# Patient Record
Sex: Male | Born: 1940
Health system: Southern US, Community
[De-identification: ages and names within clinical notes are randomized; demographics above are authoritative.]

## PROBLEM LIST (undated history)

## (undated) DIAGNOSIS — K219 Gastro-esophageal reflux disease without esophagitis: Secondary | ICD-10-CM

## (undated) DIAGNOSIS — K449 Diaphragmatic hernia without obstruction or gangrene: Secondary | ICD-10-CM

## (undated) DIAGNOSIS — J189 Pneumonia, unspecified organism: Secondary | ICD-10-CM

## (undated) DIAGNOSIS — M1990 Unspecified osteoarthritis, unspecified site: Secondary | ICD-10-CM

## (undated) DIAGNOSIS — N486 Induration penis plastica: Secondary | ICD-10-CM

## (undated) DIAGNOSIS — J45909 Unspecified asthma, uncomplicated: Secondary | ICD-10-CM

## (undated) DIAGNOSIS — G8929 Other chronic pain: Secondary | ICD-10-CM

## (undated) DIAGNOSIS — R519 Headache, unspecified: Secondary | ICD-10-CM

## (undated) DIAGNOSIS — K648 Other hemorrhoids: Secondary | ICD-10-CM

## (undated) DIAGNOSIS — R51 Headache: Secondary | ICD-10-CM

## (undated) HISTORY — DX: Unspecified osteoarthritis, unspecified site: M19.90

## (undated) HISTORY — DX: Headache, unspecified: R51.9

## (undated) HISTORY — PX: SKIN CANCER EXCISION: SHX779

## (undated) HISTORY — DX: Other hemorrhoids: K64.8

## (undated) HISTORY — DX: Gastro-esophageal reflux disease without esophagitis: K21.9

## (undated) HISTORY — DX: Unspecified asthma, uncomplicated: J45.909

## (undated) HISTORY — DX: Induration penis plastica: N48.6

## (undated) HISTORY — DX: Diaphragmatic hernia without obstruction or gangrene: K44.9

## (undated) HISTORY — DX: Headache: R51

## (undated) HISTORY — PX: FINGER SURGERY: SHX640

## (undated) HISTORY — PX: SHOULDER SURGERY: SHX246

## (undated) HISTORY — PX: KNEE SURGERY: SHX244

## (undated) HISTORY — DX: Other chronic pain: G89.29

## (undated) HISTORY — PX: COLONOSCOPY: SHX174

---

## 1999-06-07 HISTORY — PX: OTHER SURGICAL HISTORY: SHX169

## 2001-02-22 ENCOUNTER — Emergency Department (HOSPITAL_COMMUNITY): Admission: EM | Admit: 2001-02-22 | Discharge: 2001-02-22 | Payer: Self-pay | Admitting: Emergency Medicine

## 2001-02-22 ENCOUNTER — Encounter: Payer: Self-pay | Admitting: Emergency Medicine

## 2001-04-09 ENCOUNTER — Ambulatory Visit (HOSPITAL_COMMUNITY): Admission: RE | Admit: 2001-04-09 | Discharge: 2001-04-09 | Payer: Self-pay | Admitting: Urology

## 2002-01-29 ENCOUNTER — Encounter: Payer: Self-pay | Admitting: Gastroenterology

## 2003-06-04 ENCOUNTER — Encounter: Payer: Self-pay | Admitting: Gastroenterology

## 2003-07-25 ENCOUNTER — Encounter: Admission: RE | Admit: 2003-07-25 | Discharge: 2003-07-25 | Payer: Self-pay | Admitting: Otolaryngology

## 2005-04-14 ENCOUNTER — Ambulatory Visit: Payer: Self-pay | Admitting: Gastroenterology

## 2005-04-22 ENCOUNTER — Ambulatory Visit: Payer: Self-pay | Admitting: Gastroenterology

## 2005-05-26 ENCOUNTER — Ambulatory Visit (HOSPITAL_COMMUNITY): Admission: RE | Admit: 2005-05-26 | Discharge: 2005-05-26 | Payer: Self-pay | Admitting: Gastroenterology

## 2005-05-26 ENCOUNTER — Encounter: Payer: Self-pay | Admitting: Gastroenterology

## 2005-05-27 ENCOUNTER — Ambulatory Visit: Payer: Self-pay | Admitting: Gastroenterology

## 2005-09-12 ENCOUNTER — Ambulatory Visit: Payer: Self-pay | Admitting: Gastroenterology

## 2006-01-18 ENCOUNTER — Ambulatory Visit: Payer: Self-pay | Admitting: Gastroenterology

## 2006-07-14 ENCOUNTER — Ambulatory Visit: Payer: Self-pay | Admitting: Gastroenterology

## 2007-03-21 ENCOUNTER — Ambulatory Visit: Payer: Self-pay | Admitting: Gastroenterology

## 2007-06-18 ENCOUNTER — Ambulatory Visit: Payer: Self-pay | Admitting: Vascular Surgery

## 2007-08-07 DIAGNOSIS — K219 Gastro-esophageal reflux disease without esophagitis: Secondary | ICD-10-CM | POA: Insufficient documentation

## 2007-08-07 DIAGNOSIS — K449 Diaphragmatic hernia without obstruction or gangrene: Secondary | ICD-10-CM | POA: Insufficient documentation

## 2007-08-07 DIAGNOSIS — N4889 Other specified disorders of penis: Secondary | ICD-10-CM | POA: Insufficient documentation

## 2007-08-07 DIAGNOSIS — R51 Headache: Secondary | ICD-10-CM | POA: Insufficient documentation

## 2007-08-07 DIAGNOSIS — R519 Headache, unspecified: Secondary | ICD-10-CM | POA: Insufficient documentation

## 2007-08-07 DIAGNOSIS — M129 Arthropathy, unspecified: Secondary | ICD-10-CM | POA: Insufficient documentation

## 2007-11-09 ENCOUNTER — Encounter: Payer: Self-pay | Admitting: Pulmonary Disease

## 2007-11-28 ENCOUNTER — Ambulatory Visit: Payer: Self-pay | Admitting: Pulmonary Disease

## 2007-11-28 DIAGNOSIS — J45909 Unspecified asthma, uncomplicated: Secondary | ICD-10-CM | POA: Insufficient documentation

## 2007-11-28 DIAGNOSIS — R0602 Shortness of breath: Secondary | ICD-10-CM | POA: Insufficient documentation

## 2007-11-28 DIAGNOSIS — R498 Other voice and resonance disorders: Secondary | ICD-10-CM | POA: Insufficient documentation

## 2007-12-13 ENCOUNTER — Telehealth (INDEPENDENT_AMBULATORY_CARE_PROVIDER_SITE_OTHER): Payer: Self-pay | Admitting: *Deleted

## 2007-12-18 ENCOUNTER — Telehealth (INDEPENDENT_AMBULATORY_CARE_PROVIDER_SITE_OTHER): Payer: Self-pay | Admitting: *Deleted

## 2007-12-26 ENCOUNTER — Ambulatory Visit: Payer: Self-pay | Admitting: Pulmonary Disease

## 2008-01-18 ENCOUNTER — Telehealth (INDEPENDENT_AMBULATORY_CARE_PROVIDER_SITE_OTHER): Payer: Self-pay | Admitting: *Deleted

## 2008-01-31 ENCOUNTER — Telehealth (INDEPENDENT_AMBULATORY_CARE_PROVIDER_SITE_OTHER): Payer: Self-pay | Admitting: *Deleted

## 2008-02-08 ENCOUNTER — Telehealth: Payer: Self-pay | Admitting: Pulmonary Disease

## 2008-04-03 ENCOUNTER — Telehealth (INDEPENDENT_AMBULATORY_CARE_PROVIDER_SITE_OTHER): Payer: Self-pay | Admitting: *Deleted

## 2008-04-07 ENCOUNTER — Ambulatory Visit: Payer: Self-pay | Admitting: Pulmonary Disease

## 2008-04-07 DIAGNOSIS — J209 Acute bronchitis, unspecified: Secondary | ICD-10-CM | POA: Insufficient documentation

## 2008-04-17 DIAGNOSIS — K648 Other hemorrhoids: Secondary | ICD-10-CM | POA: Insufficient documentation

## 2008-04-18 ENCOUNTER — Ambulatory Visit: Payer: Self-pay | Admitting: Gastroenterology

## 2008-06-11 ENCOUNTER — Telehealth (INDEPENDENT_AMBULATORY_CARE_PROVIDER_SITE_OTHER): Payer: Self-pay | Admitting: *Deleted

## 2008-07-03 ENCOUNTER — Telehealth (INDEPENDENT_AMBULATORY_CARE_PROVIDER_SITE_OTHER): Payer: Self-pay | Admitting: *Deleted

## 2008-09-02 ENCOUNTER — Telehealth: Payer: Self-pay | Admitting: Gastroenterology

## 2008-09-02 ENCOUNTER — Telehealth (INDEPENDENT_AMBULATORY_CARE_PROVIDER_SITE_OTHER): Payer: Self-pay | Admitting: *Deleted

## 2008-10-06 ENCOUNTER — Ambulatory Visit: Payer: Self-pay | Admitting: Pulmonary Disease

## 2008-10-14 ENCOUNTER — Telehealth: Payer: Self-pay | Admitting: Gastroenterology

## 2008-10-27 ENCOUNTER — Telehealth (INDEPENDENT_AMBULATORY_CARE_PROVIDER_SITE_OTHER): Payer: Self-pay | Admitting: *Deleted

## 2008-11-04 ENCOUNTER — Telehealth: Payer: Self-pay | Admitting: Pulmonary Disease

## 2008-11-24 ENCOUNTER — Telehealth: Payer: Self-pay | Admitting: Pulmonary Disease

## 2008-11-25 ENCOUNTER — Ambulatory Visit: Payer: Self-pay | Admitting: Pulmonary Disease

## 2008-12-01 ENCOUNTER — Telehealth: Payer: Self-pay | Admitting: Pulmonary Disease

## 2008-12-02 ENCOUNTER — Telehealth: Payer: Self-pay | Admitting: Gastroenterology

## 2008-12-23 ENCOUNTER — Telehealth (INDEPENDENT_AMBULATORY_CARE_PROVIDER_SITE_OTHER): Payer: Self-pay | Admitting: *Deleted

## 2009-01-20 ENCOUNTER — Telehealth: Payer: Self-pay | Admitting: Pulmonary Disease

## 2009-02-16 ENCOUNTER — Telehealth (INDEPENDENT_AMBULATORY_CARE_PROVIDER_SITE_OTHER): Payer: Self-pay | Admitting: *Deleted

## 2009-02-18 ENCOUNTER — Ambulatory Visit: Payer: Self-pay | Admitting: Pulmonary Disease

## 2009-03-02 ENCOUNTER — Telehealth (INDEPENDENT_AMBULATORY_CARE_PROVIDER_SITE_OTHER): Payer: Self-pay | Admitting: *Deleted

## 2009-05-04 ENCOUNTER — Ambulatory Visit: Payer: Self-pay | Admitting: Gastroenterology

## 2009-05-04 ENCOUNTER — Ambulatory Visit: Payer: Self-pay | Admitting: Pulmonary Disease

## 2009-05-04 DIAGNOSIS — J45901 Unspecified asthma with (acute) exacerbation: Secondary | ICD-10-CM | POA: Insufficient documentation

## 2009-06-01 ENCOUNTER — Telehealth: Payer: Self-pay | Admitting: Gastroenterology

## 2009-06-01 ENCOUNTER — Telehealth (INDEPENDENT_AMBULATORY_CARE_PROVIDER_SITE_OTHER): Payer: Self-pay | Admitting: *Deleted

## 2009-06-08 ENCOUNTER — Ambulatory Visit: Payer: Self-pay | Admitting: Pulmonary Disease

## 2009-06-09 ENCOUNTER — Telehealth: Payer: Self-pay | Admitting: Pulmonary Disease

## 2009-06-11 ENCOUNTER — Telehealth: Payer: Self-pay | Admitting: Pulmonary Disease

## 2009-09-30 ENCOUNTER — Telehealth (INDEPENDENT_AMBULATORY_CARE_PROVIDER_SITE_OTHER): Payer: Self-pay | Admitting: *Deleted

## 2009-12-10 ENCOUNTER — Telehealth: Payer: Self-pay | Admitting: Gastroenterology

## 2009-12-10 ENCOUNTER — Ambulatory Visit: Payer: Self-pay | Admitting: Pulmonary Disease

## 2009-12-31 ENCOUNTER — Telehealth: Payer: Self-pay | Admitting: Gastroenterology

## 2010-02-09 ENCOUNTER — Encounter (INDEPENDENT_AMBULATORY_CARE_PROVIDER_SITE_OTHER): Payer: Self-pay | Admitting: *Deleted

## 2010-03-11 ENCOUNTER — Telehealth: Payer: Self-pay | Admitting: Gastroenterology

## 2010-03-30 ENCOUNTER — Telehealth: Payer: Self-pay | Admitting: Gastroenterology

## 2010-05-10 ENCOUNTER — Ambulatory Visit: Payer: Self-pay | Admitting: Gastroenterology

## 2010-05-10 DIAGNOSIS — K649 Unspecified hemorrhoids: Secondary | ICD-10-CM | POA: Insufficient documentation

## 2010-05-10 DIAGNOSIS — R1310 Dysphagia, unspecified: Secondary | ICD-10-CM | POA: Insufficient documentation

## 2010-05-19 ENCOUNTER — Encounter: Payer: Self-pay | Admitting: Gastroenterology

## 2010-05-19 ENCOUNTER — Ambulatory Visit (HOSPITAL_COMMUNITY)
Admission: RE | Admit: 2010-05-19 | Discharge: 2010-05-19 | Payer: Self-pay | Source: Home / Self Care | Attending: Gastroenterology | Admitting: Gastroenterology

## 2010-05-19 ENCOUNTER — Ambulatory Visit: Payer: Self-pay | Admitting: Internal Medicine

## 2010-05-19 ENCOUNTER — Telehealth: Payer: Self-pay | Admitting: Gastroenterology

## 2010-05-19 DIAGNOSIS — R932 Abnormal findings on diagnostic imaging of liver and biliary tract: Secondary | ICD-10-CM | POA: Insufficient documentation

## 2010-06-03 ENCOUNTER — Telehealth: Payer: Self-pay | Admitting: Gastroenterology

## 2010-07-08 NOTE — Letter (Signed)
Summary: [facility]  [facility]   Imported By: Sherian Rein 05/11/2010 11:58:14  _____________________________________________________________________  External Attachment:    Type:   Image     Comment:   External Document

## 2010-07-08 NOTE — Procedures (Signed)
Summary: Modified Barium Swallow   Modified Barium Swallow  Procedure date:  05/19/2010  Findings:      abnormal:   MODIFIED BARIUM SWALLOW STUDY SWALLOW HISTORY  14Dec11 11:00am BD  SWALLOW HX    MBSS Outpatient                          Yes        14Dec11 11:47am BD    Therapy Diagnosis Impairment             Dysphagia  14Dec11 11:47am BD    Dx/Referral Reason/Past Medical Hx       Yes        14Dec11 11:47am BD           Comment: Mr. Gehl arrives for an outpatient MBS due to           physician conerns that the pt may be aspirating secondary to           complaints of intermittent coughing with liquids, GERD and           hoarseness. Mr. Sleight appears very healthy and does not           name any significant medical history other than an episode of           pneumonia a few years ago.    Pt FollowsDirections/CompleteStrategies  Yes        14Dec11 11:47am BD    Dentition condition                      Adequate   14Dec11 11:47am BD    Postural control adequate for testing    Yes        14Dec11 11:47am BD    Current Diet                             Regular    14Dec11 11:47am BD    Liquids                                  Thin       14Dec11 11:47am BD    Oral Motor Evaluation                    WFL        14Dec11 11:47am BD    Penetration/Aspiration Scale Guide       Yes        14Dec11 11:47am BD           Comment: 1 = material does not enter airway           2 = material enters airway, remains ABOVE vocal cords           then ejected out           3 = material enters airway, remains ABOVE cords and not           ejected out           4 = material enters airway, CONTACTS cords then ejected           out           5 = material enters airway, CONTACTS cords and not ejected           out  6 = material enters airway, passes BELOW cords then ejected           out           7 = material enters airway, passes BELOW cords and not           ejected out despite cough attempt  by patient           8 = material enters airway, passes BELOW cords without           attempt by patient to eject out(silent aspiration)  SWALLOW PHASE THIN/TSP  14Dec11 11:00am BD  ORAL THIN TEASPOON    Not tested-TT                            Yes        14Dec11 11:47am BD  ORAL PHARYNGEAL THIN TEASPOON  PHARYNGEAL THIN TEASPOON  SWALLOW PHASE THIN/CUP  14Dec11 11:00am BD  ORAL THIN CUP  ORAL PHARYNGEAL THIN CUP    Premature bolus loss over tongue base-TC Yes        14Dec11 11:48am BD    Swallow initiation location-TC           PyriformSi 14Dec11 11:48am BD  PHARYNGEAL THIN CUP    Decreased tongue base retraction-TC      Yes        14Dec11 11:48am BD    Decreased pharyngeal contraction-TC      Yes        14Dec11 11:48am BD    Vallecular Residue-TC                    Mild       14Dec11 11:48am BD    Pyriform Sinus Residue-TC                Mild       14Dec11 11:48am BD    Silent Aspiration-TC                     Yes        14Dec11 11:48am BD    Aspiration before swallow-TC             Yes        14Dec11 11:48am BD    Compensatory strategies-TC               Yes        14Dec11 11:50am BD           Comment: Aspiration did not occur with a chin tuck or after           pt was instructed to take small single sips.    Penetration/Aspiration Scale-TC          1,8        14Dec11 11:48am BD  SWALLOW PHASE THIN/STRAW  14Dec11 11:00am BD  ORAL THIN STRAW    Not tested-TS                            Yes        14Dec11 11:48am BD  ORAL PHARYNGEAL THIN STRAW  PHARYNGEAL THIN STRAW  SWALLOW PHASE NECTAR/TSP  14Dec11 11:00am BD  ORAL NECTAR TEASPOON    Not tested-NT                            Yes  14Dec11 11:48am BD  ORAL PHARYNGEAL NECTAR TEASPOON  PHARYNGEAL NECTAR TEASPOON  SWALLOW PHASE NECTAR/CUP  14Dec11 11:00am BD  ORAL NECTAR CUP    Not tested-[place]                            Yes        14Dec11 11:48am BD  ORAL PHARNYGEAL NECTAR CUP  PHARYNGEAL NECTAR CUP  SWALLOW PHASE  NECTAR/STRAW  14Dec11 11:00am BD  ORAL NECTAR STRAW    Not tested-NS                            Yes        14Dec11 11:48am BD  ORAL PHARYNGEAL NECTAR STRAW  PHARYNGEAL NECTAR STRAW  SWALLOW PHASE HONEY/TSP  14Dec11 11:00am BD  ORAL HONEY TEASPOON    Not tested-HT                            Yes        14Dec11 11:48am BD  ORAL PHARYNGEAL HONEY TEASPOON  PHARYNGEAL HONEY TEASPOON  SWALLOW PHASE HONEY/CUP  14Dec11 11:00am BD  ORAL HONEY CUP    Not tested-HC                            Yes        14Dec11 11:48am BD  ORAL PHARYNGEAL HONEY CUP  PHARYNGEAL HONEY CUP  SWALLOW PHASE PUREE  14Dec11 11:00am BD  ORAL PUREE  ORAL PHARYNGEAL PUREE    Swallow initiation location-P            Valleculae 14Dec11 11:49am BD  PHARYNGEAL PUREE    Penetration/Aspiration Scale-P           1          14Dec11 11:49am BD  SWALLOW PHASE MECHANICAL SOFT  14Dec11 11:00am BD  ORAL MECHANICAL SOFT  ORAL PHARYNGEAL MECHANICAL SOFT    Swallow initiation location-MS           Valleculae 14Dec11 11:49am BD  PHARYNGEAL MECHANICAL SOFT    Penetration/Aspiration Scale-MS          1          14Dec11 11:49am BD  SWALLOW PHASE MIXED  14Dec11 11:00am BD  ORAL MIXED CONSISTENCY  ORAL PHARYNGEAL MIXED CONSISTENCY  PHARYNGEAL MIXED CONSISTENCY  SWALLOW PHASE PILL  14Dec11 11:00am BD  ORAL PILL  ORAL PHARYNGEAL PILL    Premature bolus loss over tongue base-PL Yes        14Dec11 11:51am BD    Swallow initiation location-PL           PyriformSi 14Dec11 11:51am BD  PHARYNGEAL PILL    Penetration/Aspiration Scale-PL          1          14Dec11 11:51am BD  SWALLOW EVALUATION  14Dec11 11:00am BD  SWALLOW EVAL    Normal UES Relaxation                    Yes        14Dec11 12:09pm BD    Esophageal Bolus Transit                 Yes        14Dec11 12:09pm BD  Comment: Esophageal scan with solids, liquids and barium           tablet did not appear to reveal stasis or reflux.  No           radiologist  present to confirm.  MBSS RECOMMENDATIONS    Clinical Impressions/Functional Problems Yes        14Dec11 12:09pm BD           Comment: Mr. Zylstra presents with a moderate oropharyngeal           dysphagia with one instance of significant silent aspiration           of thin liquids.  Aspiration occurred due to impaired oral           containment of liquid bolus with premature spillage to           pyriform sinuses as well as an overall delay in swallow           initiation to the pyriforms due to decreased sensation.  When           the pt was instructed to take small single sips he showed           improved oral containment of POs as well as more timely           protection of the airway.  A chin tuck also benefitted pt.           Mild residue remained in the valleculae and pyriform sinuses           with thin liquids.  Strength of musculature did not appear           significantly impaired though presence of residuals suggests           decreased base of tongue retraction and pharyngeal           contraction.  At this time, the pt may continue a           regular/thin diet with strict adherence to small single sip           strategy.  The pt was also urged to seek outpatient ST for           exercises and guidance with strategies.  Etiology of pt's           dysphagia unknown but severity of silent aspiration in an           apparently healthy man warrants further examination, (onset           of neuromuscular disorder?) consider neurology consult.    Diet recommendations                     Regular    14Dec11 12:09pm BD    Liquid recommendations                   Thin       14Dec11 12:09pm BD    Swallowing therapy recommended           Yes        14Dec11 12:09pm BD    Able to participate in swallow therapy   Yes        14Dec11 12:09pm BD  MBSS ASPIRATION PRECAUTIONS    Supervision needed                       None       14Dec11 12:09pm BD  Eat/drink only when alert                 Yes        14Dec11 12:09pm BD    Seated/upright, 90 degrees               Yes        14Dec11 12:09pm BD    No Straws                                Yes        14Dec11 12:09pm BD    Small bites/sips                         Yes        14Dec11 12:09pm BD  TREATMENT PLAN AND GOALS  14Dec11 11:00am BD  MBSS TREATMENT PLAN/GOALS    Defer treatment plan to SLP at           Outpatient 14Dec11 12:09pm BD    Inpatient Treatment Plan N/A             Yes        14Dec11 12:09pm BD  MBSS MUTUAL GOALS    Mutual Goals N/A                         Yes        14Dec11 12:09pm BD  MBSS INTERVENTIONS  MBSS EDUCATION AND DISCHARGE  14Dec11 11:00am BD  MBSS PATIENT/FAMILY EDUCATION    Recommend patient receive f/u therapy at Outpatient 14Dec11 12:10pm BD    Compensatory strategies provided         DemoVerbal 14Dec11 12:10pm BD    Diet modification provided               Verbal     14Dec11 12:10pm BD    Aspiration precautions provided          Verbal     14Dec11 12:10pm BD    Reflux precautions provided              Verbal     14Dec11 12:10pm BD    Verbalizes understanding of education    Pt/family  14Dec11 12:10pm BD    MBSS Goals/discharge criteria discussed* Yes        14Dec11 12:10pm BD  MBSS TIME    MBSS Therapy Time In                     1100       14Dec11 12:10pm BD    MBSS Therapy Time Out                    1130       14Dec11 12:10pm BD    MBSS Therapy Charges/minutes             MBS        14Dec11 12:10pm BD  MBSS REPORTS    MBSS Copy to Referring Physician         Yes        14Dec11 12:10pm BD  MBSS REVIEW OF STUDENT COMPLETION    Appended Document: Modified Barium Swallow See MBSS recommendations ST outpatient consult Neurology consult with these record and recent office records  Appended Document: Modified Barium Swallow Spoke with patient and explained to the patient of the MBSS recommendations.  Faxed over referral information for [facility] to get a appt date and time. Waiting on information  of date and time of first available appoitnment.  Recommended to patient ST consult also.

## 2010-07-08 NOTE — Progress Notes (Signed)
Summary: re-fax ov notes/ surgery clearance  Phone Note Call from Patient   Caller: karen w/ dr collin's office Call For: Erik Gomez Summary of Call: fax was not received (re: surgery clearance) although kc did sign this yesterday evening. please re- fax asap to attn: karen [phone] (i have verified fax # w/ karen Initial call taken by: Tivis Ringer,  June 11, 2009 9:54 AM  Follow-up for Phone Call        note refaxed.Carron Curie CMA  June 11, 2009 9:57 AM

## 2010-07-08 NOTE — Progress Notes (Signed)
Summary: Samples  Phone Note Call from Patient Call back at Home Phone [phone]   Caller: Patient Call For: Dr. Russella Dar Reason for Call: Talk to Nurse Summary of Call: would like some samples of Nexium Initial call taken by: Karna Christmas,  December 31, 2009 9:26 AM  Follow-up for Phone Call        Samples left up front for pt to pick up and pt notified. Lot # [id number] Follow-up by: Christie Nottingham CMA Duncan Dull),  December 31, 2009 9:31 AM

## 2010-07-08 NOTE — Progress Notes (Signed)
Summary: Samples of Nexium  Phone Note Call from Patient Call back at Home Phone [phone]   Caller: Pat-spouse Call For: Dr Russella Dar Summary of Call: Wonders if we have any Nexium samples they can have? Initial call taken by: Leanor Kail [facility],  March 11, 2010 8:07 AM  Follow-up for Phone Call        Told pt we cannot continue to give them samples of Nexium and that the pt does need a follow-up in a couple of months. Samples left up front for pt to pick up. Pt agreed. Follow-up by: Christie Nottingham CMA Duncan Dull),  March 11, 2010 8:26 AM

## 2010-07-08 NOTE — Assessment & Plan Note (Signed)
Summary: annual...as.   History of Present Illness Visit Type: Follow-up Visit Primary GI MD: Elie Goody MD [facility] Primary Provider: Geoffry Paradise, MD Requesting Provider: na  Chief Complaint: F/u for GERD. Pt states that when he swallows liquids he coughs History of Present Illness:   Erik Gomez is here today with his wife and notes intermittent problems with hoarseness. He notes episodes of  "strangling" describes as coughing with drinking liquids or swallowing saliva. He has no difficulty swallowing foods. He has no heartburn, regurgitation, chest pain or weight loss.   GI Review of Systems    Reports dysphagia with liquids.      Denies abdominal pain, acid reflux, belching, bloating, chest pain, dysphagia with solids, heartburn, loss of appetite, nausea, vomiting, vomiting blood, weight loss, and  weight gain.        Denies anal fissure, black tarry stools, change in bowel habit, constipation, diarrhea, diverticulosis, fecal incontinence, heme positive stool, hemorrhoids, irritable bowel syndrome, jaundice, light color stool, liver problems, rectal bleeding, and  rectal pain.  Current Medications (verified):  Current Medications (verified): 1)  Nexium 40 Mg  Cpdr (Esomeprazole Magnesium) .... Take 1 Tablet By Mouth Two Times A Day 2)  Proair Hfa 108 (90 Base) Mcg/act  Aers (Albuterol Sulfate) .Marland Kitchen.. 1-2 Puffs Every 4-6 Hours As Needed 3)  Singulair 10 Mg Tabs (Montelukast Sodium) .... Take 1 Tablet By Mouth Once A Day 4)  Amoxicillin 500 Mg Tabs (Amoxicillin) .... One Tablet By Mouth Three Times A Day  After Dental Surgery Will Finish Tmr.  Allergies (verified): 1)  ! Codeine 2)  ! * Mobic 3)  ! * Protonix  Past History:  Past Medical History: INTRINSIC ASTHMA, WITH EXACERBATION (ICD-493.12) INTERNAL HEMORRHOIDS (ICD-455.0) ASTHMATIC BRONCHITIS, ACUTE (ICD-466.0) INTRINSIC ASTHMA, UNSPECIFIED (ICD-493.10) ARTHRITIS (ICD-716.90) HEADACHE, CHRONIC  (ICD-784.0) PEYRONIE'S DISEASE (ICD-607.89) HIATAL HERNIA (ICD-553.3) GERD (ICD-530.81)  Past Surgical History: Peyronie's Disease Surgery-2001 Miiddle finger right hand surgery Left Knee Surgery   Family History: Reviewed history from 05/04/2009 and no changes required. heart disease: father, mother No FH of Colon Cancer Family History of Diabetes: sister, brother, father  Social History: Reviewed history from 05/04/2009 and no changes required. Patient states former smoker.  pt is married. pt has children. pt works as a Nurse, adult Alcohol Use - yes glass wine seldom Daily Caffeine Use  Review of Systems       The patient complains of allergy/sinus and arthritis/joint pain.         The pertinent positives and negatives are noted as above and in the HPI. All other ROS were reviewed and were negative.  Vital Signs:  Patient profile:   70 year old male Height:      70 inches Weight:      196 pounds BMI:     28.22 BSA:     2.07 Pulse rate:   60 / minute Pulse rhythm:   regular BP sitting:   120 / 64  (left arm) Cuff size:   regular  Vitals Entered By: Ok Anis CMA (May 10, 2010 2:56 PM)  Physical Exam  General:  Well developed, well nourished, no acute distress. Head:  Normocephalic and atraumatic. Eyes:  PERRLA, no icterus. Ears:  Normal auditory acuity. Mouth:  No deformity or lesions, dentition normal. Neck:  Supple; no masses or thyromegaly. Lungs:  Clear throughout to auscultation. Heart:  Regular rate and rhythm; no murmurs, rubs,  or bruits. Abdomen:  Soft, nontender and nondistended. No masses, hepatosplenomegaly or  hernias noted. Normal bowel sounds. Msk:  Symmetrical with no gross deformities. Normal posture. Pulses:  Normal pulses noted. Extremities:  No clubbing, cyanosis, edema or deformities noted. Neurologic:  Alert and  oriented x4;  grossly normal neurologically. Psych:  Alert and cooperative. Normal mood and  affect.  Impression & Recommendations:  Problem # 1:  GERD (ICD-530.81) GERD with LPR. His intermittent hoarseness could be related to LPR, but I am concerned there may be other ENT problems. I recommended returning to Dr.Wolicki or another ENT is for evaluation and he states he would like to consider this option. Continue Nexium 40 mg twice daily, and standard antireflux measures. Given long-term PPI usage will screening for osteopenia/osteoporosis. Orders:. T-Bone Densitometry [phone])  Problem # 2:  DYSPHAGIA UNSPECIFIED (ICD-787.20) Coughing with swallowing saliva and liquids. Rule out oropharyngeal disorders. Orders: Barium Swallow, Modified  (Modified BS)  Problem # 3:  HOARSENESS (ICD-784.49) See problem #1.  Patient Instructions: 1)  We have scheduled your Bone Density study on 05/19/10 at 9:30am.  2)  We will call you tomorrow to schedule your Modified Barium Swallow.  3)  Copy sent to : Geoffry Paradise, MD 4)  The medication list was reviewed and reconciled.  All changed / newly prescribed medications were explained.  A complete medication list was provided to the patient / caregiver.

## 2010-07-08 NOTE — Progress Notes (Signed)
Summary: result  Phone Note Call from Patient Call back at Home Phone [phone]   Caller: Spouse Pat Call For: Dr Russella Dar Reason for Call: Talk to Nurse, Lab or Test Results Summary of Call: Patient would like Dexa scan result Initial call taken by: Tawni Levy,  June 03, 2010 9:20 AM  Follow-up for Phone Call        Message left for patient to call back.Jesse Fall RN  June 03, 2010 1:09 PM Spoke with patient's wife and let her know it takes 3-4 weeks to get these results. She will call back when 4 weeks have passed. Follow-up by: Jesse Fall RN,  June 03, 2010 3:12 PM

## 2010-07-08 NOTE — Assessment & Plan Note (Signed)
Summary: rov for asthma   Copy to:  Geoffry Paradise Primary Provider/Referring Provider:  Geoffry Paradise, MD  CC:  6 mth rov - f/u asthma - denies sob or wheezing - occas dry cough.  History of Present Illness: The pt comes in today for f/u of his known asthma.  It was difficult finding a maintenance regimen for him due to intolerance of EVERY ICS.  We ended up treating him with singulair, and he seems to be doing well on this.  He has not had a flareup or required his rescue inhaler.  He has mnimal cough from postnasal drip, and is staying on his reflux meds.  Current Medications (verified): 1)  Nexium 40 Mg  Cpdr (Esomeprazole Magnesium) .... Take 1 Tablet By Mouth Once Daily 2)  Proair Hfa 108 (90 Base) Mcg/act  Aers (Albuterol Sulfate) .Marland Kitchen.. 1-2 Puffs Every 4-6 Hours As Needed 3)  Singulair 10 Mg Tabs (Montelukast Sodium) .... Take 1 Tablet By Mouth Once A Day  Allergies (verified): 1)  ! Codeine 2)  ! * Mobic 3)  ! * Protonix  Review of Systems       The patient complains of non-productive cough.  The patient denies shortness of breath with activity, shortness of breath at rest, productive cough, coughing up blood, chest pain, irregular heartbeats, acid heartburn, indigestion, loss of appetite, weight change, abdominal pain, difficulty swallowing, sore throat, tooth/dental problems, headaches, nasal congestion/difficulty breathing through nose, sneezing, itching, ear ache, anxiety, depression, hand/feet swelling, joint stiffness or pain, rash, change in color of mucus, and fever.    Vital Signs:  Patient profile:   70 year old male Height:      70 inches Weight:      193 pounds BMI:     27.79 O2 Sat:      95 % on Room air Temp:     97.3 degrees F oral Pulse rate:   61 / minute BP sitting:   119 / 60  (left arm) Cuff size:   regular  O2 Flow:  Room air  Physical Exam  General:  wd male in nad Nose:  no drainage or purulence noted. Lungs:  totally clear to  auscultation Heart:  rrr, no mrg Extremities:  no edema or cyanosis Neurologic:  alert and oriented, moves all 4.   Impression & Recommendations:  Problem # 1:  INTRINSIC ASTHMA, UNSPECIFIED (ICD-493.10) the pt is doing well on his current meds, and has not had an acute exacerbation or need for rescue inhaler use.  He feels that he is at a stable baseline.  Will continue on current meds, and I have encouraged him to work on a consistent exercise program.  Medications Added to Medication List This Visit: 1)  Nexium 40 Mg Cpdr (Esomeprazole magnesium) .... Take 1 tablet by mouth once daily  Other Orders: Est. Patient Level III (11914)  Patient Instructions: 1)  no change in current meds. 2)  try and work on regular exercise 3)  followup with me in 12mos.

## 2010-07-08 NOTE — Progress Notes (Signed)
Summary: fax/ surgery clearance  Phone Note From Other Clinic   Caller: karen w/ dr Thomasena Edis- ortho doctor Call For: clance Summary of Call: pt was seen by kc yesterday for surgery clearance (knee). please fax clearance notes asap so they may schedule this for pt. fax to attn: karen [phone] Initial call taken by: Tivis Ringer,  June 09, 2009 4:39 PM  Follow-up for Phone Call        ov note still on hold.  will forward message to kc.  Aundra Millet Reynolds LPN  June 09, 2009 4:41 PM   megan, dr. Thomasena Edis office called again and stated they need the OV note ASAP. Still waiting KC to sign. Carron Curie CMA  June 10, 2009 2:00 PM   Additional Follow-up for Phone Call Additional follow up Details #1::        done and faxed. Additional Follow-up by: Barbaraann Share MD,  June 10, 2009 5:29 PM     Appended Document: fax/ surgery clearance please call Dr. Thomasena Edis' office in am and make sure they got my recent ov for patient.  thanks  Appended Document: fax/ surgery clearance LMOM with Clydie Braun at Dr. France Ravens office to call me if she didn't receive fax.

## 2010-07-08 NOTE — Progress Notes (Signed)
Summary: Samples  Phone Note Call from Patient Call back at Pt walked in   Caller: Walked in Call For: Marchelle Folks Summary of Call: Pt wants Nexium samples. Samples left up front for pt to pick up. Exp [id number] Exp 05/2012 4 boxes Initial call taken by: Christie Nottingham CMA (AAMA),  December 10, 2009 8:55 AM

## 2010-07-08 NOTE — Progress Notes (Signed)
Summary: Prior Auth.  Phone Note Call from Patient   Caller: Patient Call For: Dr. Russella Dar Reason for Call: Talk to Nurse Summary of Call: Ins. needs prior auth. for his Nexium....[facility] [phone] Initial call taken by: Karna Christmas,  March 30, 2010 12:09 PM  Follow-up for Phone Call        Pt's number is busy. Pts insurance company approved medicine until 10/2010. Follow-up by: Christie Nottingham CMA Duncan Dull),  March 30, 2010 1:58 PM  Additional Follow-up for Phone Call Additional follow up Details #1::        Pt notified of insurance approval. Additional Follow-up by: Christie Nottingham CMA Duncan Dull),  March 30, 2010 2:53 PM

## 2010-07-08 NOTE — Letter (Signed)
Summary: Office Visit Letter  [facility]  [address] [place], [place] [address]   Phone: [phone]  Fax: [phone]      February 09, 2010 MRN: [id number]   Erik Gomez [address] [place], [place]  [address]   Dear Erik Gomez,   According to our records, it is time for you to schedule a follow-up office visit with [place] in November.   At your convenience, please call [phone] (option #2)to schedule an office visit. If you have any questions, concerns, or feel that this letter is in error, we would appreciate your call.   Sincerely,  Judie Petit T. Russella Dar, M.D.  [facility] [phone]

## 2010-07-08 NOTE — Assessment & Plan Note (Signed)
Summary: rov for asthma   Copy to:  Erik Gomez Primary Provider/Referring Provider:  Geoffry Paradise, MD  CC:  Pt is here for a 4 week f/u appt.  Pt states breathing has improved.  Pt denied a cough but states he will "clear his throat" first thing in the mornings.  Pt needs surgical clearance per Dr. Kyra Gomez- L Knee.  Marland Kitchen  History of Present Illness: The pt comes in today for f/u of his known asthma.  He has been intolerant of all ICS, and was started on singulair at the last visit.  He feels he has done well on this, with no upper airway issues or flare of his asthma.  He is happy with his current level of asthma control.  Denies any significant cough or chest congestion.  Current Medications (verified): 1)  Nexium 40 Mg  Cpdr (Esomeprazole Magnesium) .... Take 1 Tablet By Mouth Two Times A Day 2)  Proair Hfa 108 (90 Base) Mcg/act  Aers (Albuterol Sulfate) .Marland Kitchen.. 1-2 Puffs Every 4-6 Hours As Needed 3)  Singulair 10 Mg Tabs (Montelukast Sodium) .... Take 1 Tablet By Mouth Once A Day  Allergies (verified): 1)  ! Codeine 2)  ! * Mobic 3)  ! * Protonix  Review of Systems      See HPI  Vital Signs:  Patient profile:   70 year old male Height:      70 inches Weight:      198.38 pounds BMI:     28.57 O2 Sat:      98 % on Room air Temp:     97.4 degrees F oral Pulse rate:   67 / minute BP sitting:   116 / 72  (left arm) Cuff size:   regular  Vitals Entered By: Erik Filter LPN (June 08, 2009 9:28 AM)  O2 Flow:  Room air CC: Pt is here for a 4 week f/u appt.  Pt states breathing has improved.  Pt denied a cough but states he will "clear his throat" first thing in the mornings.  Pt needs surgical clearance per Dr. Kyra Gomez- L Knee.   Comments Medications reviewed with patient Erik Filter LPN  June 08, 2009 9:28 AM    Physical Exam  General:  wd male in nad Nose:  no drainage noted. Lungs:  totally clear to auscultation, no wheezing Heart:  rrr Extremities:  no edema  or cyanosis Neurologic:  alert and oriented, moves all 4.   Impression & Recommendations:  Problem # 1:  INTRINSIC ASTHMA, UNSPECIFIED (ICD-493.10) the pt is doing well on his singulair.  He has been intolerant to every ICS available.  I have asked him to continue on treatment, and to followup in 6mos.  There is no reason he cannot have upcoming orthopedic surgery from a pulmonary standpoint.  Problem # 2:  HOARSENESS (ZOX-096.04)  He has minimal hoarseness at this time since being off ICS.  He primarily notices first thing in am's, and this is usually associated with postnasal drip and reflux.  He is to continue on his PPI.  If hoarseness continues to be an issue, will need ENT eval of upper airway.  Medications Added to Medication List This Visit: 1)  Singulair 10 Mg Tabs (Montelukast sodium) .... Take 1 tablet by mouth once a day  Other Orders: Est. Patient Level III (54098)  Patient Instructions: 1)  stay on singulair with albuterol for rescue. 2)  stay on your nexium 3)  followup with me in  6mos.   Prescriptions: SINGULAIR 10 MG TABS (MONTELUKAST SODIUM) Take 1 tablet by mouth once a day  #30 x 6   Entered and Authorized by:   Erik Share MD   Signed by:   Erik Share MD on 06/08/2009   Method used:   Print then Give to Patient   RxID:   [id number] PROAIR HFA 108 (90 BASE) MCG/ACT  AERS (ALBUTEROL SULFATE) 1-2 puffs every 4-6 hours as needed  #1 x 6   Entered and Authorized by:   Erik Share MD   Signed by:   Erik Share MD on 06/08/2009   Method used:   Print then Give to Patient   RxID:   [id number]

## 2010-07-08 NOTE — Progress Notes (Signed)
Summary: Nexium samples- wants to stop by today  Phone Note Call from Patient Call back at Home Phone [phone]   Caller: Spouse Call For: Dr Russella Dar Summary of Call: Wonders if we have any Nexium samples they can have? Will be here for a bone density and would like to stop by & pick some up today. Initial call taken by: Leanor Kail [facility],  May 19, 2010 8:34 AM  Follow-up for Phone Call        2 Boxes of Nexium samples given to patient when they arrived in the office today.  Follow-up by: Christie Nottingham CMA Duncan Dull),  May 19, 2010 10:03 AM

## 2010-07-08 NOTE — Progress Notes (Signed)
Summary: OTC med  Phone Note Call from Patient Call back at [phone]   Caller: Patient Call For: clance Reason for Call: Talk to Nurse Summary of Call: Pt takes Singulair, but allergies are really bad.  Wants to know if he can take an OTC allergy med like Claritin alos. Initial call taken by: Eugene Gavia,  September 30, 2009 12:01 PM  Follow-up for Phone Call        Spoke with pt.  Pt states he is taking singular qpm for athma but states his "throat is burning from allergies."  WOuld like to know if he can take something OTC like claritin for this.  Will forward to [facility] - is this ok?  Thanks! Gweneth Dimitri RN  September 30, 2009 12:19 PM   Additional Follow-up for Phone Call Additional follow up Details #1::        zyrtec would work better.  Would also try neilmed sinus rinses am and pm to rinse pollen out of nose each day Additional Follow-up by: Barbaraann Share MD,  September 30, 2009 1:37 PM    Additional Follow-up for Phone Call Additional follow up Details #2::    Spoke with pt.  Pt informed of above recs per [facility] - he verbalized understanding.  Gweneth Dimitri RN  September 30, 2009 1:40 PM

## 2010-07-08 NOTE — Miscellaneous (Signed)
Summary: BONE DENSITY  Clinical Lists Changes  Orders: Added new Test order of T-Bone Densitometry (77080) - Signed Added new Test order of T-Lumbar Vertebral Assessment (77082) - Signed 

## 2010-10-19 NOTE — Assessment & Plan Note (Signed)
[facility]                         GASTROENTEROLOGY OFFICE NOTE   Erik Gomez, Erik Gomez                    MRN:          [id number]  DATE:03/21/2007                            DOB:          1940-11-18    This is a return office visit for GERD complicated by LPR.  His symptoms  remain under excellent control on standard anti-reflux measures and  Nexium q.a.m.  He notes no globus sensation, cough, vocal changes,  heartburn, belching, dyspeptic symptoms, dysphagia or odynophagia.  His  weight is stable and his appetite is good.   CURRENT MEDICATIONS:  1. Vitamin E 400 IU daily.  2. Multivitamin daily.  3. Nexium 40 mg q.a.m.  4. Tylenol p.r.n.   MEDICATION ALLERGIES:  CODEINE, MOBIC, PROTONIX.   PHYSICAL EXAMINATION:  In no acute distress.  Weight 205.2 pounds, blood  pressure is 118/70, pulse 66 and regular.  CHEST:  Clear to auscultation bilaterally.  CARDIAC:  Regular rate and rhythm without murmurs.  ABDOMEN:  Soft and nontender with normoactive bowel sounds.   ASSESSMENT/PLAN:  1. Gastroesophageal reflux disease complicated by laryngopharyngeal      reflux.  He is again advised to begin a longterm weight loss      program.  Maintain Nexium 40 mg p.o. q.a.m. along with all standard      anti-reflux measures.  2. Colorectal cancer screening.  Recall colonoscopy recommended for      August 2013.     Venita Lick. Russella Dar, MD, [facility]  Electronically Signed    MTS/MedQ  DD: 03/26/2007  DT: 03/27/2007  Job #: [id number]   cc:   Geoffry Paradise, M.D.

## 2010-10-19 NOTE — Procedures (Signed)
DUPLEX DEEP VENOUS EXAM - LOWER EXTREMITY   INDICATION:  Pain in left calf with standing and ambulation for 3 days.  Patient complains of episodic dizziness lasting one-half day, occurring  approximately once a month for several years.   HISTORY:  Edema:  No.  Trauma/Surgery:  No.  Pain:  Yes.  PE:  No.  Previous DVT:  No.  Anticoagulants:  No.  Other:   DUPLEX EXAM:                CFV   SFV   PopV  PTV    GSV                R  L  R  L  R  L  R   L  R  L  Thrombosis    o  o     o     o      o  Spontaneous   +  +     +     +      +  Phasic        +  +     +     +      +  Augmentation  +  +     +     +      +  Compressible  +  +     +     +      +  Competent     +  +     +     +      +   Legend:  + - yes  o - no  p - partial  D - decreased   IMPRESSION:     1. No evidence of left lower extremity deep or superficial venous        thrombosis.     2. Results phoned into Dr. Geoffry Paradise at 3:30 on 06/18/2007.   _____________________________  Larina Earthly, M.D.  PB/MEDQ  D:  06/18/2007  T:  06/19/2007  Job:  [id number]

## 2010-10-22 NOTE — H&P (Signed)
NAMEHERB, BELTRE             ACCOUNT NO.:  [id number]   MEDICAL RECORD NO.:  [id number]          PATIENT TYPE:  AMB   LOCATION:  ENDO                         FACILITY:  [facility]   PHYSICIAN:  Malcolm T. Russella Dar, M.D. [facility] OF BIRTH:  1941-04-27   DATE OF ADMISSION:  05/26/2005  DATE OF DISCHARGE:  05/26/2005                                HISTORY & PHYSICAL   INDICATIONS:  70 year old male with probable gastroesophageal reflux disease  and globus sensation.  The procedure is performed off acid suppressants.  He  tolerated the procedure without difficulty.   RESULTS:  Proximal channel 0.0% of the time below pH 4.  Three episodes of  reflux were noted.   Distal channel 4.3% of the time was below pH 4 with 7.6% of the time below  pH 4 in the upright position.  There were no episodes of recumbent reflux  noted.  There were two episodes longer than five minutes.  The DeMeester  composite score analysis was 17.1.  There was reasonable symptom index  correlation with belching and meals in the distal channel.   IMPRESSION:  Abnormal pH probe with findings compatible with  gastroesophageal reflux disease.   RECOMMENDATIONS:  1.  Resume proton-pump inhibitors b.i.d.  2.  Schedule return office visit.      Venita Lick. Russella Dar, M.D. [facility]  Electronically Signed     MTS/MEDQ  D:  06/07/2005  T:  06/07/2005  Job:  [id number]

## 2010-10-22 NOTE — Assessment & Plan Note (Signed)
[facility]                           GASTROENTEROLOGY OFFICE NOTE   Gomez, Erik                    MRN:          [id number]  DATE:01/18/2006                            DOB:          [birth year]    Erik Gomez notes that his reflux symptoms are under much better control  although he still has some occasional breakthrough symptoms.  He has been  taking his Nexium twice a day as recommended and following most antireflux  measures.  He has, however, not lost weight.   MEDICATIONS:  Medications listed on the chart have been reviewed.   ALLERGIES:  CODEINE, MOBIC, PROTONIX.   PHYSICAL EXAMINATION:  GENERAL:  In no acute distress.  VITAL SIGNS:  Weight 204.8 pounds.  Blood pressure 122/70, pulse 66 and  regular.  CHEST:  Clear to auscultation bilaterally.  CARDIAC:  Regular rate and rhythm without murmurs.  ABDOMEN:  Soft, nontender with normoactive bowel sounds.   ASSESSMENT/PLAN:  Gastroesophageal reflux disease with LPR.  Reintensify all  antireflux measures.  Begin the use of four-inch bed blocks.  Begin a weight  loss program supervised by Dr. Jacky Kindle.  Maintain Nexium 40 mg p.o. b.i.d.  Return office visit in six months.                                   Venita Lick. Pleas Koch., MD, [facility]   MTS/MedQ  DD:  01/27/2006  DT:  01/27/2006  Job #:  [id number]

## 2010-10-22 NOTE — Assessment & Plan Note (Signed)
[facility]                         GASTROENTEROLOGY OFFICE NOTE   LEXIE, MORINI                    MRN:          [id number]  DATE:07/14/2006                            DOB:          1941/02/20    This is a return office visit for GERD, complicated by LPR.  His  symptoms are under excellent control on standard anti-reflux measures  and Nexium b.i.d.  He has no gastrointestinal complaints.   CURRENT MEDICATIONS:  1. Vitamin E 400 international units daily.  2. Nexium 40 mg p.o. b.i.d.  3. Doxycycline 100 mg b.i.d.  4. Tylenol p.r.n.   MEDICATION ALLERGIES:  CODEINE, MOBIC, PROTONIX.   EXAMINATION:  No acute distress.  Weight 207.6 pounds, up 3 pounds since  his visit in August 2007.  Blood pressure 124/64, pulse 64 and regular.  HEENT:  Anicteric sclerae, oropharynx clear.  CHEST:  Clear to auscultation and percussion.  CARDIAC:  Regular rate and rhythm without murmurs appreciated.  ABDOMEN:  Soft and nontender with normoactive bowel sounds.   ASSESSMENT/PLAN:  1. Gastroesophageal reflux disease complicated by LPR.  He is again      advised to begin a long term weight loss program, supervised by his      primary physician.  He would like to decrease Nexium to once a day.      It was not beneficial at this dose in the past but he can certainly      try to see if it will work.  If not, he may return to b.i.d.      therapy.  2. Colorectal cancer screening.  Recall colonoscopy      recommended for August 2013.     Venita Lick. Russella Dar, MD, [facility]  Electronically Signed    MTS/MedQ  DD: 07/14/2006  DT: 07/14/2006  Job #: [id number]   cc:   Geoffry Paradise, M.D.

## 2010-10-22 NOTE — Op Note (Signed)
[facility]  Patient:    Erik Gomez, TUDISCO Visit Number: [id number] MRN: [id number]          Service Type: DSU Location: DAY Attending Physician:  Trisha Mangle Dictated by:   Veverly Fells Vernie Ammons, M.D. Proc. Date: 04/09/01 Admit Date:  04/09/2001                             Operative Report  PREOPERATIVE DIAGNOSIS:  Peyronies disease.  POSTOPERATIVE DIAGNOSIS:  Peyronies disease.  PROCEDURE:  Peyronies plaque incision and patch grafting.  SURGEON:  Mark C. Vernie Ammons, M.D.  ASSISTANT:  Barron Alvine, M.D.  ANESTHESIA:  General.  SPECIMENS:  None.  ESTIMATED BLOOD LOSS:  25 cc.  COMPLICATIONS:  None.  INDICATIONS FOR PROCEDURE:  The patient is a 70 year old white male with adequate erectile ability but significant dorsal curvature and curvature to the right precluding intermission.  The risks and complications associated with this surgical correction were discussed with the patient and these are fully outlined in my office notes which have been placed on the chart.  He understands and wishes to proceed.  DESCRIPTION OF OPERATION:  After informed consent, the patient was brought to the major operating room and placed on the table and administered general anesthesia in the supine position.  His genitalia was sterilely prepped and draped.  I introduced a 16 red rubber catheter into the bladder draining the bladder.  I then made a circumcising incision circumferentially and dissected the skin back to the base of the penis.  The dorsal vein was identified, ligated and divided.  I then placed a tourniquet at the base of the penis and performed artificial erection.  I noted significant curvature greater than 90 degrees with some curvature of the penis to the right as well.  I marked the location of the greatest curvature.  I then incised Bucks fascia over the corpora laterally at the point of greatest curvature and dissected Bucks fascia off of  the corpora encompassing the neurovascular bundle.  This was done with a combination of sharp and blunt technique.  I was able to free the neurovascular bundle from all the way across the midline.  A vessel loop was then placed beneath the neurovascular bundle and it was used to elevate this as I dissected it further off the area of the plaque.  Once the neurovascular bundle was fully freed up proximally and distally from the area of the greatest curvature that was previously marked, a second artificial erection was performed again, I noted that the area of greatest curvature and some wasting as well.  This was marked.  I then incised at the area of greatest curvature approximately two-fifths circumference around the penis.  The initial incising incision was made transverse to the length of the penis.  Further lateral incisions were then made at the furthest extent of the incision to allow this to open up further and form more of a rectangular shape.  I then chose a segment of pericardium and sutured this in place using running 4-0 PDS sutures.  Another artificial erection was then performed after the patch was sewn in place and I noted excellent straightening of the penis. I, therefore, closed the Bucks fascia on each side with running 5-0 chromic sutures.  A dorsal penile block was performed using 20 cc of 0.5% plain Marcaine.  The two skin edges were reapproximated with running 5-0 chromic suture.  The patient tolerated the  procedure well and there were no intraoperative complications.  Needle, sponge and instruments were reported correct x 2 at the end of the operation.  He then had antibiotic ointment applied to the incision to the suture line and sterile gauze gently wrapped around the penis and Coban was applied to hold this in position.  He was awakened and taken to the recovery room. Dictated by:   Veverly Fells Vernie Ammons, M.D. Attending Physician:  Trisha Mangle DD:   04/09/01 TD:  04/10/01 Job: [id number]

## 2010-10-27 ENCOUNTER — Telehealth: Payer: Self-pay | Admitting: Gastroenterology

## 2010-10-27 NOTE — Telephone Encounter (Signed)
nexium samples left up nexium .

## 2010-10-29 ENCOUNTER — Telehealth: Payer: Self-pay | Admitting: Pulmonary Disease

## 2010-10-29 NOTE — Telephone Encounter (Signed)
Spoke w/ pt wife and she states pt's proair exp date is for may 2012 and wanted to know how many days after that is it still good for? Per TD is it good 60 days after exp date. Wife verbalized understanding and states pt never uses the proair and pt has an apt coming up w/ [facility] in July and states she wants to talk to him about this medication at that time. Nothing further was needed

## 2010-11-24 ENCOUNTER — Other Ambulatory Visit: Payer: Self-pay | Admitting: Gastroenterology

## 2010-11-24 NOTE — Telephone Encounter (Signed)
Spoke with patient's wife and she states that she would really like some Nexium samples because they cannot afford the copay right now. I left 6 boxes of samples up front for patient to pick up.

## 2010-12-17 ENCOUNTER — Encounter: Payer: Self-pay | Admitting: Pulmonary Disease

## 2010-12-17 ENCOUNTER — Telehealth: Payer: Self-pay | Admitting: Gastroenterology

## 2010-12-17 MED ORDER — ESOMEPRAZOLE MAGNESIUM 40 MG PO CPDR: 40.0000 mg | DELAYED_RELEASE_CAPSULE | Freq: Every day | ORAL | Status: DC

## 2010-12-17 NOTE — Telephone Encounter (Signed)
Wife advised samples are out front

## 2010-12-20 ENCOUNTER — Encounter: Payer: Self-pay | Admitting: Pulmonary Disease

## 2010-12-20 ENCOUNTER — Ambulatory Visit (INDEPENDENT_AMBULATORY_CARE_PROVIDER_SITE_OTHER): Payer: Medicare Other | Admitting: Pulmonary Disease

## 2010-12-20 VITALS — BP 128/74 | HR 57 | Temp 97.7°F | Ht 70.0 in | Wt 194.8 lb

## 2010-12-20 DIAGNOSIS — J45909 Unspecified asthma, uncomplicated: Secondary | ICD-10-CM

## 2010-12-20 MED ORDER — MONTELUKAST SODIUM 10 MG PO TABS: 10.0000 mg | ORAL_TABLET | Freq: Every day | ORAL | Status: DC

## 2010-12-20 NOTE — Progress Notes (Signed)
  Subjective:    Patient ID: Erik Gomez, male    DOB: Jul 02, 1940, 70 y.o.   MRN: [id number]  HPI The pt comes in today for f/u of his known asthma.  He has been intolerant of every ICS, and therefore has been managed on singulair with success.  He denies increased rescue inhaler use, no nocturnal symptoms, and no recent acute exacerbation.  He is having some hoarseness, but has known reflux and pnd.  He feels his exertional tolerance is adequate.    Review of Systems  Constitutional: Negative for fever and unexpected weight change.  HENT: Negative for ear pain, nosebleeds, congestion, sore throat, rhinorrhea, sneezing, trouble swallowing, dental problem, postnasal drip and sinus pressure.   Eyes: Negative for redness and itching.  Respiratory: Negative for cough, chest tightness, shortness of breath and wheezing.   Cardiovascular: Negative for palpitations and leg swelling.  Gastrointestinal: Negative for nausea and vomiting.  Genitourinary: Positive for dysuria.  Musculoskeletal: Negative for joint swelling.  Skin: Positive for rash.  Neurological: Negative for headaches.  Hematological: Bruises/bleeds easily.  Psychiatric/Behavioral: Negative for dysphoric mood. The patient is not nervous/anxious.        Objective:   Physical Exam Wd male in nad Nares without obvious discharge or purulence Chest with mild decrease in bs, no wheezing or rhonchi Cor with rrr LE without edema, no cyanosis Alert and oriented, moves all 4        Assessment & Plan:

## 2010-12-20 NOTE — Assessment & Plan Note (Signed)
The pt is doing very well on singulair alone, and has not had to use his rescue inhaler since last visit.  His allergy symptoms are fairly well controlled as well.  Will continue with current treatment, and he is to f/u in a year.  Will check spirometry next visit to make sure we are controlling airway inflammation well enough.

## 2010-12-20 NOTE — Patient Instructions (Signed)
No change in meds.  Will send in a prescription for generic singulair if available. followup with me in one year, and will recheck your breathing tests next visit (simple ones)

## 2011-01-13 ENCOUNTER — Telehealth: Payer: Self-pay | Admitting: Gastroenterology

## 2011-01-13 ENCOUNTER — Telehealth: Payer: Self-pay | Admitting: Pulmonary Disease

## 2011-01-13 NOTE — Telephone Encounter (Signed)
Called patient informed the wife that I will leave some Nexium samples out front for patient to pick up. Pt states she will pick them up today.

## 2011-01-13 NOTE — Telephone Encounter (Signed)
Advised no samples available at this time. Carron Curie, CMA

## 2011-02-18 ENCOUNTER — Other Ambulatory Visit: Payer: Self-pay | Admitting: Gastroenterology

## 2011-02-21 NOTE — Telephone Encounter (Signed)
Informed patient that I will leave samples out front for patient to pick up.

## 2011-03-07 ENCOUNTER — Other Ambulatory Visit: Payer: Self-pay

## 2011-03-07 MED ORDER — ESOMEPRAZOLE MAGNESIUM 40 MG PO CPDR: 40.0000 mg | DELAYED_RELEASE_CAPSULE | Freq: Two times a day (BID) | ORAL | Status: DC

## 2011-03-08 ENCOUNTER — Other Ambulatory Visit: Payer: Self-pay | Admitting: *Deleted

## 2011-03-08 MED ORDER — MONTELUKAST SODIUM 10 MG PO TABS: 10.0000 mg | ORAL_TABLET | Freq: Every day | ORAL | Status: DC

## 2011-05-23 ENCOUNTER — Ambulatory Visit (INDEPENDENT_AMBULATORY_CARE_PROVIDER_SITE_OTHER): Payer: Medicare Other | Admitting: Pulmonary Disease

## 2011-05-23 ENCOUNTER — Encounter: Payer: Self-pay | Admitting: Gastroenterology

## 2011-05-23 ENCOUNTER — Encounter: Payer: Self-pay | Admitting: Pulmonary Disease

## 2011-05-23 ENCOUNTER — Ambulatory Visit (INDEPENDENT_AMBULATORY_CARE_PROVIDER_SITE_OTHER): Payer: Medicare Other | Admitting: Gastroenterology

## 2011-05-23 VITALS — BP 128/72 | HR 80 | Ht 70.0 in | Wt 192.0 lb

## 2011-05-23 DIAGNOSIS — K219 Gastro-esophageal reflux disease without esophagitis: Secondary | ICD-10-CM

## 2011-05-23 DIAGNOSIS — R059 Cough, unspecified: Secondary | ICD-10-CM

## 2011-05-23 DIAGNOSIS — R05 Cough: Secondary | ICD-10-CM | POA: Insufficient documentation

## 2011-05-23 DIAGNOSIS — J45909 Unspecified asthma, uncomplicated: Secondary | ICD-10-CM

## 2011-05-23 DIAGNOSIS — R053 Chronic cough: Secondary | ICD-10-CM | POA: Insufficient documentation

## 2011-05-23 MED ORDER — PREDNISONE 10 MG PO TABS: ORAL_TABLET | ORAL | Status: DC

## 2011-05-23 NOTE — Progress Notes (Signed)
History of Present Illness: This is a 70 year old male here today with his wife for GERD with LPR. His GERD and LPR symptoms have been under very good control on Nexium twice daily. Over the past 2 weeks he has had a cough producing white colored sputum. Denies weight loss, abdominal pain, constipation, diarrhea, change in stool caliber, melena, hematochezia, nausea, vomiting, dysphagia, reflux symptoms, chest pain.  Current Medications, Allergies, Past Medical History, Past Surgical History, Family History and Social History were reviewed in [employer] record.  Physical Exam: General: Well developed , well nourished, no acute distress Head: Normocephalic and atraumatic Eyes:  sclerae anicteric, EOMI Ears: Normal auditory acuity Mouth: No deformity or lesions Lungs: Clear throughout to auscultation Heart: Regular rate and rhythm; no murmurs, rubs or bruits Abdomen: Soft, non tender and non distended. No masses, hepatosplenomegaly or hernias noted. Normal Bowel sounds Musculoskeletal: Symmetrical with no gross deformities  Pulses:  Normal pulses noted Extremities: No clubbing, cyanosis, edema or deformities noted Neurological: Alert oriented x 4, grossly nonfocal Psychological:  Alert and cooperative. Normal mood and affect  Assessment and Recommendations:  1. GERD with LPR. Continue Nexium 40 mg twice daily and standard antireflux measures. I do not think his acute cough is related to reflux and advised him to see either Dr. Jacky Kindle or Dr. Shelle Iron for further evaluation. As he has in the past he wonders whether he can decrease or discontinue Nexium. His LPR symptoms were not controlled on once a day medication previously but I advised him he may try taking Nexium on a daily basis and if his symptoms are not controlled resume Nexium 40 mg twice a day.  2. Colorectal cancer screening. Average risk. Screening colonoscopy due August 2013.  3. Cough. Possibly bronchitis.  Followup with Dr. Jacky Kindle or Dr. Shelle Iron for further evaluation.

## 2011-05-23 NOTE — Patient Instructions (Addendum)
You have been given samples of Nexium to take 1 tab twice daily. We will contact medicare about approval for your Nexium. cc: Geoffry Paradise, MD

## 2011-05-23 NOTE — Assessment & Plan Note (Signed)
The patient has a history of asthma, and has been "intolerant" to every inhaled corticosteroid.  He has been doing well on Singulair alone.  I do not think his current cough has anything to do with his asthma, and have asked him to continue on Singulair for now.

## 2011-05-23 NOTE — Assessment & Plan Note (Signed)
The patient has developed a persistent cough over the last 2 weeks after what he describes as a viral upper respiratory illness.  I suspect his cough is upper airway in origin, and probably multifactorial.  My suspicion is that postnasal drip, his reflux, and also a cyclical mechanism is responsible for his current symptoms.  I would like to treat him with an antihistamine over the next few weeks, and also a short course of prednisone to help with his upper airway inflammation from coughing.  I have also reviewed with him the various behavioral techniques to help control cyclical coughing.

## 2011-05-23 NOTE — Progress Notes (Signed)
  Subjective:    Patient ID: Erik Gomez, male    DOB: 05-20-1941, 70 y.o.   MRN: [id number]  HPI Patient comes in today for an acute sick visit.  He has known asthma with intolerance to inhaled corticosteroids, and gives a two-week history of persistent cough after what sounds like a mild upper respiratory viral illness.  The patient primarily has a dry barking cough that is occasionally productive of small quantities of white mucus.  He denies any active postnasal drip but does have nasal symptoms.  He also has a history of reflux disease but is on b.i.d. Proton pump inhibitor.  He clearly has a cyclical mechanism to his cough, and describes cough paroxysms that mostly occur at night upon lying down.  He denies any increased shortness of breath, chest congestion, or active wheezing.   Review of Systems  Constitutional: Positive for fever. Negative for unexpected weight change.  HENT: Positive for congestion, sore throat and postnasal drip. Negative for ear pain, nosebleeds, rhinorrhea, sneezing, trouble swallowing, dental problem and sinus pressure.   Eyes: Negative for redness and itching.  Respiratory: Positive for cough, chest tightness and wheezing. Negative for shortness of breath.   Cardiovascular: Negative for palpitations and leg swelling.  Gastrointestinal: Negative for nausea and vomiting.  Genitourinary: Negative for dysuria.  Musculoskeletal: Negative for joint swelling.  Skin: Negative for rash.  Neurological: Negative for headaches.  Hematological: Does not bruise/bleed easily.  Psychiatric/Behavioral: Negative for dysphoric mood. The patient is not nervous/anxious.        Objective:   Physical Exam Well-developed male in no acute distress Nose without purulence or discharge noted, some edema and erythema of mucosa Oropharynx without exudates or other abnormalities Chest is totally clear to auscultation, no wheezing Cardiac exam with irregular rhythm but controlled  ventricular response Lower extremities without edema, no cyanosis noted Alert and oriented, moves all 4 extremities.       Assessment & Plan:

## 2011-05-23 NOTE — Patient Instructions (Addendum)
Chlorpheniramine 8mg  (over the counter) each night at bedtime for next 10 days Will treat with course of prednisone over 8days to help with inflammation NO THROAT CLEARING.  Just swallow or drink water. Use hard candy (no menthol or mint) to bathe the back of your throat during waking hours. Call us in 2 weeks if no better.

## 2011-05-26 ENCOUNTER — Telehealth: Payer: Self-pay

## 2011-05-26 NOTE — Telephone Encounter (Signed)
I spoke with the insurance company and was advised that approval was given through 06/05/12.  Pt wife has been notified

## 2011-05-26 NOTE — Telephone Encounter (Signed)
The pt's wife returned call and she gave me another number to try [phone].  I will try this number and call her when I get an answer

## 2011-05-26 NOTE — Telephone Encounter (Signed)
Pt requested that we send an appeal to the insurance company for denial of nexium twice daily dosing.  The pt provided the following [facility], [address], [place], [place] [address], Appeals and Grievance Unit.  No phone number was provided.  I researched and called [phone] and was transferred to [phone] again I was transferred to [phone] and again transferred to another number [phone].  When I was transferred to the last number I got a fast busy signal.  I was on the phone for 18 minutes and did not get any information.  Call placed to the pt for further contact information for his insurance and a message was left.

## 2011-06-08 ENCOUNTER — Telehealth: Payer: Self-pay | Admitting: Pulmonary Disease

## 2011-06-08 NOTE — Telephone Encounter (Signed)
The only way to know if there is a sinus issue is to do a scan of his sinuses.  If the dentist doesn't think he has a dental problem to explain his jaw pain, and his jaw still hurts, would consider it.  If the jaw isn't hurting, and he has no sinus complaints, would not pursue but just keep in mind.

## 2011-06-08 NOTE — Telephone Encounter (Signed)
I spoke with spouse and she states Dr. Alvester Morin sent pt over to Dr. Daleen Bo ([facility]) and examed pt and advised him that sinus's were "clouded" per pt. Pt went to him for jaw pain. Pt spouse Dr. Daleen Bo office advised him to call Dr. Shelle Iron regarding this. She states pt is feeling better than he did. Pt cough is gone away, no sinus drainage or congestion. Please advise Dr. Shelle Iron, thanks

## 2011-06-09 ENCOUNTER — Other Ambulatory Visit: Payer: Self-pay | Admitting: Pulmonary Disease

## 2011-06-09 DIAGNOSIS — R6884 Jaw pain: Secondary | ICD-10-CM | POA: Insufficient documentation

## 2011-06-09 NOTE — Telephone Encounter (Signed)
Will send order to [facility] for ct sinuses.

## 2011-06-09 NOTE — Telephone Encounter (Signed)
Pt's spouse aware ct sinus will be scheduled and our PCc's will be contacting them regarding this appt. Spouse verbalized understanding.

## 2011-06-09 NOTE — Telephone Encounter (Signed)
I spoke with spouse and she states she spoke with dentist yesterday and he advised her that their was no dental problems causing his jaw pain. The spouse states that the jaw pain comes and goes and sometimes it's severe. She thinks it would be a good idea to do a scan of his sinus's. Please advise Dr. Shelle Iron, thanks

## 2011-06-10 ENCOUNTER — Ambulatory Visit (INDEPENDENT_AMBULATORY_CARE_PROVIDER_SITE_OTHER)
Admission: RE | Admit: 2011-06-10 | Discharge: 2011-06-10 | Disposition: A | Discharge status: Home / Self Care | Payer: Medicare Other | Source: Ambulatory Visit | Attending: Pulmonary Disease | Admitting: Pulmonary Disease

## 2011-06-10 ENCOUNTER — Telehealth: Payer: Self-pay | Admitting: Pulmonary Disease

## 2011-06-10 DIAGNOSIS — J329 Chronic sinusitis, unspecified: Secondary | ICD-10-CM | POA: Diagnosis not present

## 2011-06-10 DIAGNOSIS — R6884 Jaw pain: Secondary | ICD-10-CM

## 2011-06-10 MED ORDER — AMOXICILLIN-POT CLAVULANATE 875-125 MG PO TABS: 1.0000 | ORAL_TABLET | Freq: Two times a day (BID) | ORAL | Status: AC

## 2011-06-10 NOTE — Telephone Encounter (Signed)
Let pt know that I have reviewed his ct sinuses.  There is minimal thickening of the sinuses, but no fluid or obvious infection.  I do not think this is causing his jaw pain, but I am willing to give him a short course of abx to see if helps. Can call in augmentin 875mg  one in am and pm for next 7 days.  Take with food and large glass of water.

## 2011-06-10 NOTE — Telephone Encounter (Signed)
Spoke with pt's spouse and notified of results/recs per [facility]. She verbalized understanding and states nothing further needed. Rx was sent to pharm.

## 2011-06-10 NOTE — Telephone Encounter (Signed)
Spoke with pt's spouse. She states pt had CT at 1:30 today, she is requesting these results asap in case something needs to be called in for the pt. I advised that it looks like results not in yet, but will send msg to [facility] for him to be aware that she is awaiting these results. Please advise, thanks!

## 2011-06-13 ENCOUNTER — Telehealth: Payer: Self-pay | Admitting: Pulmonary Disease

## 2011-06-13 NOTE — Telephone Encounter (Signed)
I spoke with pt and he wanted to know if he could take tylenol for his headaches while on the abx that was given to him. I advised pt that was fine. He voiced his understanding and had no further questions

## 2011-06-22 ENCOUNTER — Telehealth: Payer: Self-pay | Admitting: Pulmonary Disease

## 2011-06-22 ENCOUNTER — Other Ambulatory Visit: Payer: Self-pay | Admitting: Pulmonary Disease

## 2011-06-22 DIAGNOSIS — R0989 Other specified symptoms and signs involving the circulatory and respiratory systems: Secondary | ICD-10-CM

## 2011-06-22 DIAGNOSIS — R6884 Jaw pain: Secondary | ICD-10-CM

## 2011-06-22 NOTE — Telephone Encounter (Signed)
Pt is aware of KC recs and that the [facility] will call once appt is scheduled for ENT. Pt verbalized understanding.

## 2011-06-22 NOTE — Telephone Encounter (Signed)
LMTCB

## 2011-06-22 NOTE — Telephone Encounter (Signed)
Pt says he finished Augmentin Last Fri., 1/11 and still has sinus pressure/pain. He says his cheeks and teeth just on the left side. He doesn't have nay sinus drainage, no cough, no fever. I asked if he had any "bad" teeth and he said no and that he had recently seen his dentist. Will forward msg to [facility] for recs. Pls advise. Allergies  Allergen Reactions  . Codeine   . Meloxicam   . Pantoprazole Sodium

## 2011-06-22 NOTE — Telephone Encounter (Signed)
Let pt know that I suspect these ongoing symptoms are not related to the mild thickening seen on sinus ct.  If he did not respond to abx, then he needs further evaluation by ENT to see if any of this is related or if something different entirely. Will send order to [facility].

## 2011-06-28 DIAGNOSIS — K089 Disorder of teeth and supporting structures, unspecified: Secondary | ICD-10-CM | POA: Diagnosis not present

## 2011-07-12 ENCOUNTER — Telehealth: Payer: Self-pay | Admitting: Gastroenterology

## 2011-07-12 NOTE — Telephone Encounter (Signed)
Informed patient that I left some samples out front for them to pick up. Pt agreed and will come get them today.

## 2011-07-22 ENCOUNTER — Other Ambulatory Visit: Payer: Self-pay | Admitting: Gastroenterology

## 2011-07-22 MED ORDER — ESOMEPRAZOLE MAGNESIUM 40 MG PO CPDR: 40.0000 mg | DELAYED_RELEASE_CAPSULE | Freq: Two times a day (BID) | ORAL | Status: DC

## 2011-07-22 NOTE — Telephone Encounter (Signed)
Sent the prescription for Nexium to [facility] pharmacy.

## 2011-08-10 ENCOUNTER — Other Ambulatory Visit: Payer: Self-pay | Admitting: Gastroenterology

## 2011-08-10 NOTE — Telephone Encounter (Signed)
Pt notified that we do not have any Nexium samples currently but to call back in a couple of weeks to check back. Pt agreed.

## 2011-08-17 ENCOUNTER — Telehealth: Payer: Self-pay | Admitting: Gastroenterology

## 2011-08-17 NOTE — Telephone Encounter (Signed)
Left 5 boxes of Nexium samples out front for patient to pick up. Pt notified.

## 2011-09-16 ENCOUNTER — Telehealth: Payer: Self-pay | Admitting: Gastroenterology

## 2011-09-16 NOTE — Telephone Encounter (Signed)
3 boxes of nexium placed at front desk for patient pick up. I have advised him that we dont have many Nexium samples left at this time so I am unable to provide more than that at this time. Patient verbalizes understanding.

## 2011-09-30 ENCOUNTER — Telehealth: Payer: Self-pay | Admitting: Pulmonary Disease

## 2011-09-30 NOTE — Telephone Encounter (Signed)
I would like him to try tylenol cold and sinus thru the weekend and see if he improves.  Let us know Monday if not getting better.

## 2011-09-30 NOTE — Telephone Encounter (Signed)
Called and spoke with pt. He is c/o increased cough and hoarseness x 2 to 3 days. He states cough is prod with minimal yellow sputum. He states that he has low grade temp - 99.9 this am. He denies any wheeze, chest pain or tightness, or change in his breathing. Taking singulair and zyrtec. Please advise, thanks! Allergies  Allergen Reactions  . Codeine   . Meloxicam   . Pantoprazole Sodium

## 2011-09-30 NOTE — Telephone Encounter (Signed)
Called, spoke with pt.  I informed him KC recs he try tylenol cold and sinus thru the weekend to see if he improves.  Advised he should call back Monday if not getting better.  He verbalized understanding of these recs.

## 2011-10-03 ENCOUNTER — Ambulatory Visit (INDEPENDENT_AMBULATORY_CARE_PROVIDER_SITE_OTHER): Payer: Medicare Other | Admitting: Pulmonary Disease

## 2011-10-03 ENCOUNTER — Encounter: Payer: Self-pay | Admitting: Pulmonary Disease

## 2011-10-03 VITALS — BP 124/70 | HR 56 | Temp 97.9°F | Ht 70.5 in | Wt 191.4 lb

## 2011-10-03 DIAGNOSIS — J309 Allergic rhinitis, unspecified: Secondary | ICD-10-CM | POA: Diagnosis not present

## 2011-10-03 MED ORDER — BENZONATATE 100 MG PO CAPS: 200.0000 mg | ORAL_CAPSULE | Freq: Four times a day (QID) | ORAL | Status: DC | PRN

## 2011-10-03 NOTE — Progress Notes (Signed)
  Subjective:    Patient ID: Erik Gomez, male    DOB: Oct 07, 1940, 71 y.o.   MRN: [id number]  HPI The patient comes in today for an acute sick visit.  He developed increased nasal congestion and postnasal drip with cough in the last week.  I asked him to try Tylenol cold and sinus, which he did, with significant improvement.  He is concerned however because his nasal symptoms have persisted, but primarily it is postnasal drip at this time rather than congestion.  He also has a persistent cough, but he has a long history of cyclical coughing whenever he gets sick.  He denies any chest congestion or increased shortness of breath, and has blown no purulent material from his nose.   Review of Systems  Constitutional: Positive for fever. Negative for unexpected weight change.  HENT: Positive for sore throat, voice change and postnasal drip. Negative for ear pain, nosebleeds, congestion, rhinorrhea, sneezing, trouble swallowing, dental problem and sinus pressure.   Eyes: Negative for redness and itching.  Respiratory: Negative for cough, chest tightness, shortness of breath and wheezing.   Cardiovascular: Negative for palpitations and leg swelling.  Gastrointestinal: Negative for nausea and vomiting.  Genitourinary: Negative for dysuria.  Musculoskeletal: Negative for joint swelling.  Skin: Negative for rash.  Neurological: Negative for headaches.  Hematological: Does not bruise/bleed easily.  Psychiatric/Behavioral: Negative for dysphoric mood. The patient is not nervous/anxious.        Objective:   Physical Exam Well-developed male in no acute distress Nose without purulence or discharge, mild inflammatory changes on the left. Oropharynx clear except for obvious postnasal drip Chest fairly clear to auscultation, no wheezes or rhonchi Heart exam with regular rate and rhythm Lower extremities without edema, no cyanosis Alert and oriented, moves all 4 extremities.       Assessment &  Plan:

## 2011-10-03 NOTE — Patient Instructions (Signed)
Stop tylenol cold and sinus for now. Try chlorpheniramine 8mg  at bedtime and even at lunch if needed for postnasal drip. Tessalon pearls 100mg  2 every 6hrs if needed for cough Don't forget about trying hard candy and no throat clearing to help with your cough Let me know if this doesn't improve.

## 2011-10-03 NOTE — Assessment & Plan Note (Signed)
The patient's history is most consistent with allergic rhinitis.  He has no history to sleep just a sinusitis, and denies any chest congestion to suggest an acute bronchitis.  His lungs were totally clear to auscultation, and there are no wheezes.  He has seen improvement with Tylenol cold and sinus, but over the weekend has had a little more cough.  He does have a history of cyclical coughing with any irritation to his throat.  Since he is having persistent postnasal drip, I have asked him to start on chlorpheniramine, and also reviewed the behavioral therapies for cyclical coughing again.  He is to let us know if he does not have further improvement.

## 2011-10-18 ENCOUNTER — Telehealth: Payer: Self-pay | Admitting: Gastroenterology

## 2011-10-18 NOTE — Telephone Encounter (Signed)
Samples of Nexium left out front for patient to pick up.

## 2011-11-03 ENCOUNTER — Encounter: Payer: Self-pay | Admitting: Gastroenterology

## 2011-11-07 DIAGNOSIS — H2510 Age-related nuclear cataract, unspecified eye: Secondary | ICD-10-CM | POA: Diagnosis not present

## 2011-11-07 DIAGNOSIS — H5200 Hypermetropia, unspecified eye: Secondary | ICD-10-CM | POA: Diagnosis not present

## 2011-11-07 DIAGNOSIS — H52209 Unspecified astigmatism, unspecified eye: Secondary | ICD-10-CM | POA: Diagnosis not present

## 2011-11-07 DIAGNOSIS — H25019 Cortical age-related cataract, unspecified eye: Secondary | ICD-10-CM | POA: Diagnosis not present

## 2011-11-21 ENCOUNTER — Other Ambulatory Visit: Payer: Self-pay | Admitting: Gastroenterology

## 2011-11-21 NOTE — Telephone Encounter (Signed)
Informed patient that unfortunately we cannot continue to give her Nexium samples for her husband every month since we try to give them to the patients that are here in the office first. Told her that we can still give her samples every once in a while when she is in the donut hole but just not every month. Pt's wife agreed and states she will not call as often to get samples. Told her I will still leave some out front today for her to pick up for her husband. Pt agree and will come by today.

## 2011-11-28 DIAGNOSIS — B359 Dermatophytosis, unspecified: Secondary | ICD-10-CM | POA: Diagnosis not present

## 2011-11-28 DIAGNOSIS — L820 Inflamed seborrheic keratosis: Secondary | ICD-10-CM | POA: Diagnosis not present

## 2011-12-19 ENCOUNTER — Ambulatory Visit: Payer: Medicare Other | Admitting: Pulmonary Disease

## 2011-12-26 ENCOUNTER — Ambulatory Visit: Payer: Medicare Other | Admitting: Pulmonary Disease

## 2012-01-06 ENCOUNTER — Encounter: Payer: Self-pay | Admitting: Pulmonary Disease

## 2012-01-06 ENCOUNTER — Encounter: Payer: Self-pay | Admitting: Gastroenterology

## 2012-01-06 ENCOUNTER — Ambulatory Visit (INDEPENDENT_AMBULATORY_CARE_PROVIDER_SITE_OTHER): Payer: Medicare Other | Admitting: Pulmonary Disease

## 2012-01-06 VITALS — BP 120/62 | HR 59 | Temp 97.4°F | Ht 70.5 in | Wt 189.0 lb

## 2012-01-06 DIAGNOSIS — J45909 Unspecified asthma, uncomplicated: Secondary | ICD-10-CM

## 2012-01-06 DIAGNOSIS — J309 Allergic rhinitis, unspecified: Secondary | ICD-10-CM | POA: Diagnosis not present

## 2012-01-06 NOTE — Patient Instructions (Signed)
Stay on singulair Take zyrtec 10mg  or chlorpheniramine 8mg  at bedtime for a few weeks to see if helps nasal symptoms.  You may have to stay on this everyday if it does help. Use nasal rinses whenever outside quite a bit and when congested.  Can use am and pm or just once a day as needed.  If doing well, followup with me in one year.

## 2012-01-06 NOTE — Progress Notes (Signed)
  Subjective:    Patient ID: Erik Gomez, male    DOB: Jul 28, 1940, 71 y.o.   MRN: [id number]  HPI The patient comes in today for followup of his known asthma and also allergic rhinitis.  He has been intolerant to inhaled corticosteroids in the past, and has been maintained on Singulair.  He feels that he has done very well, and has not had any acute exacerbations or needed his rescue inhaler.  He is having a lot of allergy symptoms with nasal congestion, but is not using an antihistamine or nasal rinses on a regular basis.  He has not had any purulence.   Review of Systems  Constitutional: Negative for fever and unexpected weight change.  HENT: Positive for postnasal drip and sinus pressure. Negative for ear pain, nosebleeds, congestion, sore throat, rhinorrhea, sneezing, trouble swallowing and dental problem.   Eyes: Negative for redness and itching.  Respiratory: Positive for cough. Negative for chest tightness, shortness of breath and wheezing.   Cardiovascular: Negative for palpitations and leg swelling.  Gastrointestinal: Negative for nausea and vomiting.  Genitourinary: Negative for dysuria.  Musculoskeletal: Negative for joint swelling.  Skin: Negative for rash.  Neurological: Negative for headaches.  Hematological: Bruises/bleeds easily.  Psychiatric/Behavioral: Negative for dysphoric mood. The patient is not nervous/anxious.        Objective:   Physical Exam Well-developed male in no acute distress Nose without purulence or discharge noted.  Mild inflammatory changes of the nasal mucosa Chest totally clear to auscultation Cardiac exam with regular rate and rhythm Lower extremities without edema, cyanosis Alert and oriented, moves all 4 extremities.       Assessment & Plan:

## 2012-01-06 NOTE — Assessment & Plan Note (Signed)
The patient is a long history of allergic rhinitis, and currently is having a lot of nasal congestion.  However, he is not staying on an antihistamine on a regular basis, nor is he doing his nasal rinses.  I've asked him to get back on these to see if things improve.  I would not step up therapy with nasal steroids or nasal spray antihistamines unless he fails more conservative treatment.

## 2012-01-06 NOTE — Assessment & Plan Note (Addendum)
The patient has done well from an asthma standpoint on Singulair alone.  He has not had an acute exacerbation or required his rescue inhaler.  His FEV1 is stable from 2009.  I've asked him to continue on his current regimen.

## 2012-01-17 ENCOUNTER — Other Ambulatory Visit: Payer: Self-pay | Admitting: Pulmonary Disease

## 2012-02-03 ENCOUNTER — Ambulatory Visit (AMBULATORY_SURGERY_CENTER): Payer: Medicare Other | Admitting: *Deleted

## 2012-02-03 VITALS — Ht 70.5 in | Wt 190.3 lb

## 2012-02-03 DIAGNOSIS — Z1211 Encounter for screening for malignant neoplasm of colon: Secondary | ICD-10-CM

## 2012-02-03 MED ORDER — PEG-KCL-NACL-NASULF-NA ASC-C 100 G PO SOLR: ORAL | Status: DC

## 2012-02-03 NOTE — Progress Notes (Signed)
No allergies to eggs or soy products 

## 2012-02-17 ENCOUNTER — Ambulatory Visit (AMBULATORY_SURGERY_CENTER): Payer: Medicare Other | Admitting: Gastroenterology

## 2012-02-17 ENCOUNTER — Encounter: Payer: Self-pay | Admitting: Gastroenterology

## 2012-02-17 VITALS — BP 131/73 | HR 83 | Temp 96.2°F | Resp 17 | Ht 70.0 in | Wt 190.0 lb

## 2012-02-17 DIAGNOSIS — Z1211 Encounter for screening for malignant neoplasm of colon: Secondary | ICD-10-CM

## 2012-02-17 DIAGNOSIS — J45909 Unspecified asthma, uncomplicated: Secondary | ICD-10-CM | POA: Diagnosis not present

## 2012-02-17 MED ORDER — SODIUM CHLORIDE 0.9 % IV SOLN: 500.0000 mL | INTRAVENOUS | Status: DC

## 2012-02-17 NOTE — Progress Notes (Signed)
Pressure applied to abdomen to reach the cecum 

## 2012-02-17 NOTE — Progress Notes (Signed)
Patient did not experience any of the following events: a burn prior to discharge; a fall within the facility; wrong site/side/patient/procedure/implant event; or a hospital transfer or hospital admission upon discharge from the facility. (G8907) Patient did not have preoperative order for IV antibiotic SSI prophylaxis. (G8918)  

## 2012-02-17 NOTE — Op Note (Signed)
[facility] [address] [place], [address]   COLONOSCOPY PROCEDURE REPORT  PATIENT: Erik Gomez, Erik Gomez  MR#: [id number] BIRTHDATE: [birth year] , 71  yrs. old GENDER: Male ENDOSCOPIST: Meryl Dare, MD, [facility]  PROCEDURE DATE:  02/17/2012 PROCEDURE:   Colonoscopy, screening ASA CLASS:   Class II INDICATIONS: average risk screening. MEDICATIONS: MAC sedation, administered by CRNA and propofol (Diprivan) 180mg  IV DESCRIPTION OF PROCEDURE:   After the risks benefits and alternatives of the procedure were thoroughly explained, informed consent was obtained.  A digital rectal exam revealed no abnormalities of the rectum.   The LB CF-[id number]  endoscope was introduced through the anus and advanced to the cecum, which was identified by both the appendix and ileocecal valve. No adverse events experienced.   The quality of the prep was excellent, using MoviPrep  The instrument was then slowly withdrawn as the colon was fully examined.   COLON FINDINGS: A normal appearing cecum, ileocecal valve, and appendiceal orifice were identified.  The ascending, hepatic flexure, transverse, splenic flexure, descending, sigmoid colon and rectum appeared unremarkable.  No polyps or cancers were seen. Retroflexed views revealed small internal hemorrhoids. The time to cecum=2 minutes 19 seconds.  Withdrawal time=9 minutes 52 seconds. The scope was withdrawn and the procedure completed. COMPLICATIONS: There were no complications.  ENDOSCOPIC IMPRESSION: 1.  Normal colon 2.  Internal hemorrhoids  RECOMMENDATIONS: 1.  Continue current colorectal screening recommendations for "routine risk" patients with a repeat colonoscopy in 10 years.   eSigned:  Meryl Dare, MD, [facility] 02/17/2012 9:19 AM   cc: Geoffry Paradise, MD

## 2012-02-17 NOTE — Patient Instructions (Addendum)

## 2012-02-20 ENCOUNTER — Telehealth: Payer: Self-pay | Admitting: *Deleted

## 2012-02-20 NOTE — Telephone Encounter (Signed)
  Follow up Call-  Call back number 02/17/2012  Post procedure Call Back phone  # [phone]  Permission to leave phone message Yes     Patient questions:  Do you have a fever, pain , or abdominal swelling? no Pain Score  0 *  Have you tolerated food without any problems? yes  Have you been able to return to your normal activities? yes  Do you have any questions about your discharge instructions: Diet   no Medications  no Follow up visit  no  Do you have questions or concerns about your Care? no  Actions: * If pain score is 4 or above: No action needed, pain <4.

## 2012-02-27 DIAGNOSIS — Z125 Encounter for screening for malignant neoplasm of prostate: Secondary | ICD-10-CM | POA: Diagnosis not present

## 2012-02-27 DIAGNOSIS — Z0000 Encounter for general adult medical examination without abnormal findings: Secondary | ICD-10-CM | POA: Diagnosis not present

## 2012-02-27 DIAGNOSIS — E079 Disorder of thyroid, unspecified: Secondary | ICD-10-CM | POA: Diagnosis not present

## 2012-02-27 DIAGNOSIS — E785 Hyperlipidemia, unspecified: Secondary | ICD-10-CM | POA: Diagnosis not present

## 2012-03-05 DIAGNOSIS — E785 Hyperlipidemia, unspecified: Secondary | ICD-10-CM | POA: Diagnosis not present

## 2012-03-05 DIAGNOSIS — Z23 Encounter for immunization: Secondary | ICD-10-CM | POA: Diagnosis not present

## 2012-03-05 DIAGNOSIS — K219 Gastro-esophageal reflux disease without esophagitis: Secondary | ICD-10-CM | POA: Diagnosis not present

## 2012-03-05 DIAGNOSIS — Z0000 Encounter for general adult medical examination without abnormal findings: Secondary | ICD-10-CM | POA: Diagnosis not present

## 2012-03-05 DIAGNOSIS — Z125 Encounter for screening for malignant neoplasm of prostate: Secondary | ICD-10-CM | POA: Diagnosis not present

## 2012-03-05 DIAGNOSIS — J45909 Unspecified asthma, uncomplicated: Secondary | ICD-10-CM | POA: Diagnosis not present

## 2012-05-07 ENCOUNTER — Ambulatory Visit (INDEPENDENT_AMBULATORY_CARE_PROVIDER_SITE_OTHER): Payer: Medicare Other | Admitting: Gastroenterology

## 2012-05-07 ENCOUNTER — Encounter: Payer: Self-pay | Admitting: Gastroenterology

## 2012-05-07 VITALS — BP 122/78 | HR 62 | Ht 70.5 in | Wt 193.0 lb

## 2012-05-07 DIAGNOSIS — K219 Gastro-esophageal reflux disease without esophagitis: Secondary | ICD-10-CM

## 2012-05-07 MED ORDER — OMEPRAZOLE 40 MG PO CPDR: 40.0000 mg | DELAYED_RELEASE_CAPSULE | Freq: Two times a day (BID) | ORAL | Status: DC

## 2012-05-07 NOTE — Patient Instructions (Signed)
We have sent the following medications to your pharmacy for you to pick up at your convenience:omeprazole.  If your symptoms return, please call our office and we will resume Nexium.

## 2012-05-07 NOTE — Progress Notes (Signed)
History of Present Illness: This is a 71 year old male with GERD and LPR. He remains asymptomatic on Nexium 40 mg twice a day. He has no gastrointestinal complaints. He underwent colonoscopy in September. For financial reasons he would like to try generic PPI if possible. He previously had difficulty controlling his symptoms until he started Nexium 40 mg twice a day. Denies weight loss, abdominal pain, constipation, diarrhea, change in stool caliber, melena, hematochezia, nausea, vomiting, dysphagia, reflux symptoms, chest pain.  Current Medications, Allergies, Past Medical History, Past Surgical History, Family History and Social History were reviewed in [employer] record.  Physical Exam: General: Well developed , well nourished, no acute distress Head: Normocephalic and atraumatic Eyes:  sclerae anicteric, EOMI Ears: Normal auditory acuity Mouth: No deformity or lesions Lungs: Clear throughout to auscultation Heart: Regular rate and rhythm; no murmurs, rubs or bruits Abdomen: Soft, non tender and non distended. No masses, hepatosplenomegaly or hernias noted. Normal Bowel sounds Musculoskeletal: Symmetrical with no gross deformities  Pulses:  Normal pulses noted Extremities: No clubbing, cyanosis, edema or deformities noted Neurological: Alert oriented x 4, grossly nonfocal Psychological:  Alert and cooperative. Normal mood and affect  Assessment and Recommendations:  1. GERD with LPR. Trial of omeprazole 40 mg twice a day and standard antireflux measures. If his symptoms return we can resume Nexium 40 mg twice a day.  2. Colorectal cancer screening. Colonoscopy in September was unremarkable. Routine screening September 2023.

## 2012-05-18 ENCOUNTER — Telehealth: Payer: Self-pay | Admitting: Pulmonary Disease

## 2012-05-18 MED ORDER — MONTELUKAST SODIUM 10 MG PO TABS: 10.0000 mg | ORAL_TABLET | Freq: Every day | ORAL | Status: DC

## 2012-05-18 NOTE — Telephone Encounter (Signed)
Singulair rx sent to [facility] on [address].  Elease Hashimoto aware.

## 2012-08-24 ENCOUNTER — Telehealth: Payer: Self-pay | Admitting: Pulmonary Disease

## 2012-08-24 NOTE — Telephone Encounter (Signed)
[place] advised pt we had no samples at this time. Nothing further was needed

## 2012-09-20 ENCOUNTER — Other Ambulatory Visit: Payer: Self-pay | Admitting: Pulmonary Disease

## 2012-09-24 ENCOUNTER — Encounter: Payer: Self-pay | Admitting: *Deleted

## 2012-09-24 ENCOUNTER — Telehealth: Payer: Self-pay | Admitting: Pulmonary Disease

## 2012-09-24 MED ORDER — MONTELUKAST SODIUM 10 MG PO TABS: 10.0000 mg | ORAL_TABLET | Freq: Every day | ORAL | Status: DC

## 2012-09-24 NOTE — Telephone Encounter (Signed)
Spoke to pt's wife. Rx will be sent in. According to pt's last OV note, KC said to return in a year. Wife wanted to go ahead schedule his one year ROV - 01/07/13 @ 10:15am.

## 2012-11-12 ENCOUNTER — Telehealth: Payer: Self-pay | Admitting: Pulmonary Disease

## 2012-11-12 DIAGNOSIS — H25019 Cortical age-related cataract, unspecified eye: Secondary | ICD-10-CM | POA: Diagnosis not present

## 2012-11-12 DIAGNOSIS — H2510 Age-related nuclear cataract, unspecified eye: Secondary | ICD-10-CM | POA: Diagnosis not present

## 2012-11-12 DIAGNOSIS — H35369 Drusen (degenerative) of macula, unspecified eye: Secondary | ICD-10-CM | POA: Diagnosis not present

## 2012-11-12 DIAGNOSIS — H5200 Hypermetropia, unspecified eye: Secondary | ICD-10-CM | POA: Diagnosis not present

## 2012-11-12 NOTE — Telephone Encounter (Signed)
He has a history of chronic allergic rhinitis.  Is this coming from nasal congestion and postnasal drip, or is this coming from his chest.  Is he taking his antihistamine regularly?

## 2012-11-12 NOTE — Telephone Encounter (Signed)
Called spoke with patient who c/o dry hacking cough, wheezing, some weakness/lack of energy, hoarseness x 3-4weeks.  Pt has upcoming ov w/ KC on 6.25.14 @ 2:30pm.  No sooner openings w/ KC and pt declined ov w/ TP when she returns to the office next week.  Instead pt is asking for recs/rx from [facility] to hold him until his next appt.  Dr Shelle Iron please advise, thank you. [facility] Allergies  Allergen Reactions  . Codeine     Upset stomach  . Meloxicam     Upset stomach  . Pantoprazole Sodium     Upset stomach

## 2012-11-12 NOTE — Telephone Encounter (Signed)
ATC line busy, WCB 

## 2012-11-13 NOTE — Telephone Encounter (Signed)
ATC line busy x 3 wcb 

## 2012-11-14 NOTE — Telephone Encounter (Signed)
LMTCB

## 2012-11-14 NOTE — Telephone Encounter (Signed)
Pt's wife returning call can be reached at [phone].Erik Gomez

## 2012-11-14 NOTE — Telephone Encounter (Signed)
Spouse is aware of recs. She wrote down recs. Nothing further was needed

## 2012-11-14 NOTE — Telephone Encounter (Signed)
Would like him to get chlorpheniramine 4mg  and take 2 at bedtime and one at lunch each day to see if helps.  If this becomes more of a chest issue, he needs to call us back. If he can't find the antihistamine, have someone behind desk show him.

## 2012-11-14 NOTE — Telephone Encounter (Signed)
Called pt's home # - [email or web address] Called pt's cell # -- Pt states he does have PND but no nasal congestion or chest congestion and feels it is in his throat.  He took an antihistamine for 2-3 days when symptoms first started but didn't have any relief so he stopped it.  Dr. Shelle Iron, pls advise.  Thank you.  ** Pt states we can try home # first.  If no answer, pls call his cell #.  Thank you.

## 2012-11-16 ENCOUNTER — Ambulatory Visit: Payer: Medicare Other | Admitting: Pulmonary Disease

## 2012-11-28 ENCOUNTER — Ambulatory Visit (INDEPENDENT_AMBULATORY_CARE_PROVIDER_SITE_OTHER): Payer: Medicare Other | Admitting: Pulmonary Disease

## 2012-11-28 ENCOUNTER — Encounter: Payer: Self-pay | Admitting: Pulmonary Disease

## 2012-11-28 VITALS — BP 112/76 | HR 66 | Temp 98.3°F | Ht 70.0 in | Wt 196.0 lb

## 2012-11-28 DIAGNOSIS — J45909 Unspecified asthma, uncomplicated: Secondary | ICD-10-CM | POA: Diagnosis not present

## 2012-11-28 NOTE — Assessment & Plan Note (Signed)
The patient continues to do well from a clinical standpoint on Singulair alone.  He has not had any acute flares, nor does he require his rescue inhaler.  His spirometry today is totally stable.  I have told him that as long as his lung function remains stable, and he is not having increased symptoms, I am satisfied with his current treatment regimen.

## 2012-11-28 NOTE — Patient Instructions (Addendum)
Continue on your current medications Stay as active as possible. followup with me in one year.

## 2012-11-28 NOTE — Progress Notes (Signed)
  Subjective:    Patient ID: Erik Gomez, male    DOB: 06/16/1940, 72 y.o.   MRN: [id number]  HPI Patient comes in today for followup of his known asthma.  He is intolerant of inhaled corticosteroids because of upper airway and dysphonia issues.  He has been maintaining on Singulair alone, and has done very well since last visit.  He has had no acute exacerbation or need for his rescue inhaler.  He is satisfied with his exertional tolerance.   Review of Systems  Constitutional: Negative for fever and unexpected weight change.  HENT: Positive for sore throat, rhinorrhea and postnasal drip. Negative for ear pain, nosebleeds, congestion, sneezing, trouble swallowing, dental problem and sinus pressure.        Allergies  Eyes: Negative for redness and itching.  Respiratory: Positive for cough and wheezing. Negative for chest tightness and shortness of breath.   Cardiovascular: Negative for palpitations and leg swelling.  Gastrointestinal: Negative for nausea and vomiting.  Genitourinary: Negative for dysuria.  Musculoskeletal: Negative for joint swelling.  Skin: Negative for rash.  Neurological: Negative for headaches.  Hematological: Does not bruise/bleed easily.  Psychiatric/Behavioral: Negative for dysphoric mood. The patient is not nervous/anxious.        Objective:   Physical Exam Well developed male in no acute distress Nose without purulent discharge noted Neck without lymphadenopathy or thyromegaly Chest totally clear to auscultation, no wheezing Cardiac exam with regular rate and rhythm Lower extremities without edema, no cyanosis Alert and oriented, moves all 4 extremities.       Assessment & Plan:

## 2013-01-07 ENCOUNTER — Ambulatory Visit: Payer: Medicare Other | Admitting: Pulmonary Disease

## 2013-02-04 ENCOUNTER — Other Ambulatory Visit: Payer: Self-pay | Admitting: Pulmonary Disease

## 2013-02-06 ENCOUNTER — Telehealth: Payer: Self-pay | Admitting: Pulmonary Disease

## 2013-02-06 MED ORDER — MONTELUKAST SODIUM 10 MG PO TABS: 10.0000 mg | ORAL_TABLET | Freq: Every day | ORAL | Status: DC

## 2013-02-06 NOTE — Telephone Encounter (Signed)
Pt aware RX has been called in. Nothing further needed 

## 2013-03-04 DIAGNOSIS — Z0000 Encounter for general adult medical examination without abnormal findings: Secondary | ICD-10-CM | POA: Diagnosis not present

## 2013-03-04 DIAGNOSIS — E785 Hyperlipidemia, unspecified: Secondary | ICD-10-CM | POA: Diagnosis not present

## 2013-03-04 DIAGNOSIS — Z125 Encounter for screening for malignant neoplasm of prostate: Secondary | ICD-10-CM | POA: Diagnosis not present

## 2013-03-11 DIAGNOSIS — Z23 Encounter for immunization: Secondary | ICD-10-CM | POA: Diagnosis not present

## 2013-03-11 DIAGNOSIS — J309 Allergic rhinitis, unspecified: Secondary | ICD-10-CM | POA: Diagnosis not present

## 2013-03-11 DIAGNOSIS — K219 Gastro-esophageal reflux disease without esophagitis: Secondary | ICD-10-CM | POA: Diagnosis not present

## 2013-03-11 DIAGNOSIS — Z125 Encounter for screening for malignant neoplasm of prostate: Secondary | ICD-10-CM | POA: Diagnosis not present

## 2013-03-11 DIAGNOSIS — Z6828 Body mass index (BMI) 28.0-28.9, adult: Secondary | ICD-10-CM | POA: Diagnosis not present

## 2013-03-11 DIAGNOSIS — J45909 Unspecified asthma, uncomplicated: Secondary | ICD-10-CM | POA: Diagnosis not present

## 2013-03-11 DIAGNOSIS — E785 Hyperlipidemia, unspecified: Secondary | ICD-10-CM | POA: Diagnosis not present

## 2013-03-11 DIAGNOSIS — Z0000 Encounter for general adult medical examination without abnormal findings: Secondary | ICD-10-CM | POA: Diagnosis not present

## 2013-03-12 DIAGNOSIS — Z1212 Encounter for screening for malignant neoplasm of rectum: Secondary | ICD-10-CM | POA: Diagnosis not present

## 2013-04-15 DIAGNOSIS — Z85828 Personal history of other malignant neoplasm of skin: Secondary | ICD-10-CM | POA: Diagnosis not present

## 2013-04-15 DIAGNOSIS — L259 Unspecified contact dermatitis, unspecified cause: Secondary | ICD-10-CM | POA: Diagnosis not present

## 2013-05-06 ENCOUNTER — Encounter: Payer: Self-pay | Admitting: Gastroenterology

## 2013-05-06 ENCOUNTER — Ambulatory Visit (INDEPENDENT_AMBULATORY_CARE_PROVIDER_SITE_OTHER): Payer: Medicare Other | Admitting: Gastroenterology

## 2013-05-06 VITALS — BP 124/76 | HR 60 | Ht 70.0 in | Wt 192.0 lb

## 2013-05-06 DIAGNOSIS — R141 Gas pain: Secondary | ICD-10-CM

## 2013-05-06 DIAGNOSIS — K219 Gastro-esophageal reflux disease without esophagitis: Secondary | ICD-10-CM | POA: Diagnosis not present

## 2013-05-06 MED ORDER — OMEPRAZOLE 40 MG PO CPDR: 40.0000 mg | DELAYED_RELEASE_CAPSULE | Freq: Two times a day (BID) | ORAL | Status: DC

## 2013-05-06 NOTE — Patient Instructions (Signed)
We have sent the following medications to your pharmacy for you to pick up at your convenience: Omeprazole.  Take over the counter Gas-X four times a day as needed for gas and bloating.   You have been given a Gas prevention diet.   Thank you for choosing me and [facility].  Venita Lick. Pleas Koch., MD., Clementeen Graham

## 2013-05-06 NOTE — Progress Notes (Signed)
    History of Present Illness: This is a 72 year old male with a history of GERD and LPR. His symptoms are under very good control on omeprazole 40 mg twice a day. Complains of worsening intestinal gas with belching and flatulence over the past several years. Gas-X has helped the symptoms. He also complains of bad breath.  Current Medications, Allergies, Past Medical History, Past Surgical History, Family History and Social History were reviewed in [employer] record.  Physical Exam: General: Well developed , well nourished, no acute distress Head: Normocephalic and atraumatic Eyes:  sclerae anicteric, EOMI Ears: Normal auditory acuity Mouth: No deformity or lesions Lungs: Clear throughout to auscultation Heart: Regular rate and rhythm; no murmurs, rubs or bruits Abdomen: Soft, non tender and non distended. No masses, hepatosplenomegaly or hernias noted. Normal Bowel sounds Musculoskeletal: Symmetrical with no gross deformities  Pulses:  Normal pulses noted Extremities: No clubbing, cyanosis, edema or deformities noted Neurological: Alert oriented x 4, grossly nonfocal Psychological:  Alert and cooperative. Normal mood and affect  Assessment and Recommendations:  1. GERD with LPR. Continue standard antireflux measures and omeprazole 40 mg twice a day.  2. Gas, belching and flatulence. Low gas diet and Gas-X 3 times a day when necessary.  3. Halitosis. Not GI related. Followup with PCP.

## 2013-05-24 ENCOUNTER — Ambulatory Visit (INDEPENDENT_AMBULATORY_CARE_PROVIDER_SITE_OTHER): Payer: Medicare Other | Admitting: Adult Health

## 2013-05-24 ENCOUNTER — Encounter: Payer: Self-pay | Admitting: Adult Health

## 2013-05-24 VITALS — BP 124/64 | HR 55 | Temp 97.1°F | Ht 70.0 in | Wt 194.8 lb

## 2013-05-24 DIAGNOSIS — J069 Acute upper respiratory infection, unspecified: Secondary | ICD-10-CM | POA: Insufficient documentation

## 2013-05-24 MED ORDER — AZITHROMYCIN 250 MG PO TABS: ORAL_TABLET | ORAL | Status: AC

## 2013-05-24 NOTE — Progress Notes (Signed)
Ov reviewed, and agree with plan as outlined.  

## 2013-05-24 NOTE — Progress Notes (Signed)
   Subjective:    Patient ID: Erik Gomez, male    DOB: Jul 15, 1940, 72 y.o.   MRN: [id number]  HPI 72 yo with known hx of asthma   05/24/2013 Acute OV  Complains of head congestion/pressure, brown/bloody nasal drainage, PND, dry cough at times, throat clearing, weakness/fatigue x2 weeks. Denies f/c/s, dyspnea, chest tightness, wheezing, nausea, vomiting No SABA use. No sinus pressure or teeth pain.  Symptoms are getting better , nasal discharge is starting to clear.  Patient has a history of asthma, and is on Singulair. Has been intolerant to inhaled corticosteroids in the past due to upper airway irritability . Says he has been doing well , rare SABA use.    Review of Systems Constitutional:   No  weight loss, night sweats,  Fevers, chills, fatigue, or  lassitude.  HEENT:   No headaches,  Difficulty swallowing,  Tooth/dental problems, or  Sore throat,                No sneezing, itching, ear ache,  +nasal congestion, post nasal drip,   CV:  No chest pain,  Orthopnea, PND, swelling in lower extremities, anasarca, dizziness, palpitations, syncope.   GI  No heartburn, indigestion, abdominal pain, nausea, vomiting, diarrhea, change in bowel habits, loss of appetite, bloody stools.   Resp: No shortness of breath with exertion or at rest.  No excess mucus, no productive cough,  No non-productive cough,  No coughing up of blood.  No change in color of mucus.  No wheezing.  No chest wall deformity  Skin: no rash or lesions.  GU: no dysuria, change in color of urine, no urgency or frequency.  No flank pain, no hematuria   MS:  No joint pain or swelling.  No decreased range of motion.  No back pain.  Psych:  No change in mood or affect. No depression or anxiety.  No memory loss.         Objective:   Physical Exam GEN: A/Ox3; pleasant , NAD, elderly   HEENT:  [place]/AT,  EACs-clear, TMs-wnl, NOSE-clear drainage, nontender sinus , THROAT-clear, no lesions, no postnasal drip or  exudate noted.   NECK:  Supple w/ fair ROM; no JVD; normal carotid impulses w/o bruits; no thyromegaly or nodules palpated; no lymphadenopathy.  RESP  Clear  P & A; w/o, wheezes/ rales/ or rhonchi.no accessory muscle use, no dullness to percussion  CARD:  RRR, no m/r/g  , no peripheral edema, pulses intact, no cyanosis or clubbing.  GI:   Soft & nt; nml bowel sounds; no organomegaly or masses detected.  Musco: Warm bil, no deformities or joint swelling noted.   Neuro: alert, no focal deficits noted.    Skin: Warm, no lesions or rashes         Assessment & Plan:

## 2013-05-24 NOTE — Patient Instructions (Signed)
Mucinex DM Twice daily  As needed  Cough/congestion  Saline nasal rinses As needed   Fluids and rest  Zpack to have on hold if symptoms do not improve or worsen /persist with discolored mucus.  Please contact office for sooner follow up if symptoms do not improve or worsen or seek emergency care  follow up Dr. Shelle Iron as planned and As needed

## 2013-05-24 NOTE — Assessment & Plan Note (Signed)
Suspect Viral URI/sinusitis  now improving  Asthma without flare   Plan  Mucinex DM Twice daily  As needed  Cough/congestion  Saline nasal rinses As needed   Fluids and rest  Zpack to have on hold if symptoms do not improve or worsen /persist with discolored mucus.  Please contact office for sooner follow up if symptoms do not improve or worsen or seek emergency care  follow up Dr. Shelle Iron as planned and As needed

## 2013-06-08 ENCOUNTER — Other Ambulatory Visit: Payer: Self-pay | Admitting: Pulmonary Disease

## 2013-06-18 ENCOUNTER — Telehealth: Payer: Self-pay | Admitting: Gastroenterology

## 2013-06-18 NOTE — Telephone Encounter (Signed)
Called patient back regarding the letter received from patient earlier today. Letter states omeprazole is no longer covered with [employer] mostly because the quantity. It states it needs a PA for the new year or an appeal if it is denied. Patient's wife is concerned that her husband should not be switched from this medicine because she felt like Dr. Fuller Plan really wanted him to stay on twice daily dosing. Told patient that I will call the insurance company to do a PA and call them back if the prescription is denied. Pt agreed. Form filled with [employer] and faxed the insurance company for Prior Authorization.

## 2013-07-17 DIAGNOSIS — Z85828 Personal history of other malignant neoplasm of skin: Secondary | ICD-10-CM | POA: Diagnosis not present

## 2013-07-17 DIAGNOSIS — L570 Actinic keratosis: Secondary | ICD-10-CM | POA: Diagnosis not present

## 2013-07-17 DIAGNOSIS — D485 Neoplasm of uncertain behavior of skin: Secondary | ICD-10-CM | POA: Diagnosis not present

## 2013-07-17 DIAGNOSIS — L259 Unspecified contact dermatitis, unspecified cause: Secondary | ICD-10-CM | POA: Diagnosis not present

## 2013-07-17 DIAGNOSIS — L821 Other seborrheic keratosis: Secondary | ICD-10-CM | POA: Diagnosis not present

## 2013-08-16 DIAGNOSIS — H9190 Unspecified hearing loss, unspecified ear: Secondary | ICD-10-CM | POA: Diagnosis not present

## 2013-08-16 DIAGNOSIS — H9319 Tinnitus, unspecified ear: Secondary | ICD-10-CM | POA: Diagnosis not present

## 2013-08-16 DIAGNOSIS — Z6828 Body mass index (BMI) 28.0-28.9, adult: Secondary | ICD-10-CM | POA: Diagnosis not present

## 2013-09-02 DIAGNOSIS — H903 Sensorineural hearing loss, bilateral: Secondary | ICD-10-CM | POA: Diagnosis not present

## 2013-09-02 DIAGNOSIS — H9319 Tinnitus, unspecified ear: Secondary | ICD-10-CM | POA: Diagnosis not present

## 2013-09-05 ENCOUNTER — Other Ambulatory Visit: Payer: Self-pay | Admitting: Pulmonary Disease

## 2013-09-09 ENCOUNTER — Telehealth: Payer: Self-pay | Admitting: Pulmonary Disease

## 2013-09-09 NOTE — Telephone Encounter (Signed)
ATC, line busy x 2 WCB

## 2013-09-09 NOTE — Telephone Encounter (Signed)
Line still busy.

## 2013-09-10 NOTE — Telephone Encounter (Signed)
Spoke with the pt's spouse  She states that her rx was already called in and they have picked up  Nothing further needed

## 2013-11-18 DIAGNOSIS — H25019 Cortical age-related cataract, unspecified eye: Secondary | ICD-10-CM | POA: Diagnosis not present

## 2013-11-18 DIAGNOSIS — H2510 Age-related nuclear cataract, unspecified eye: Secondary | ICD-10-CM | POA: Diagnosis not present

## 2013-11-18 DIAGNOSIS — H35369 Drusen (degenerative) of macula, unspecified eye: Secondary | ICD-10-CM | POA: Diagnosis not present

## 2013-11-18 DIAGNOSIS — H43819 Vitreous degeneration, unspecified eye: Secondary | ICD-10-CM | POA: Diagnosis not present

## 2013-12-02 ENCOUNTER — Ambulatory Visit (INDEPENDENT_AMBULATORY_CARE_PROVIDER_SITE_OTHER): Payer: Medicare Other | Admitting: Pulmonary Disease

## 2013-12-02 ENCOUNTER — Encounter: Payer: Self-pay | Admitting: Pulmonary Disease

## 2013-12-02 VITALS — BP 120/78 | HR 55 | Temp 98.1°F | Ht 70.0 in | Wt 193.4 lb

## 2013-12-02 DIAGNOSIS — J45909 Unspecified asthma, uncomplicated: Secondary | ICD-10-CM

## 2013-12-02 NOTE — Assessment & Plan Note (Signed)
The patient is doing very well from an asthma standpoint, and has not had any increased symptoms or need for rescue inhaler. I've asked him to continue to be as active as possible, and will see him back in one year. Will do a followup spirometry at the next visit.

## 2013-12-02 NOTE — Progress Notes (Signed)
   Subjective:    Patient ID: Erik Gomez, male    DOB: Aug 03, 1940, 73 y.o.   MRN: [id number]  HPI Patient comes in today for followup of his known asthma. He has been completely intolerant of inhaled corticosteroid, and has been maintained on Singulair alone. He feels that he is doing very well, and has never used his rescue inhaler since the last visit. He denies any significant cough or chest congestion. He does have some hoarseness in the mornings, probably related to postnasal drip.   Review of Systems  Constitutional: Negative for fever and unexpected weight change.  HENT: Negative for congestion, dental problem, ear pain, nosebleeds, postnasal drip, rhinorrhea, sinus pressure, sneezing, sore throat and trouble swallowing.   Eyes: Negative for redness and itching.  Respiratory: Negative for cough, chest tightness, shortness of breath and wheezing.   Cardiovascular: Negative for palpitations and leg swelling.  Gastrointestinal: Negative for nausea and vomiting.  Genitourinary: Negative for dysuria.  Musculoskeletal: Negative for joint swelling.  Skin: Negative for rash.  Neurological: Negative for headaches.  Hematological: Does not bruise/bleed easily.  Psychiatric/Behavioral: Negative for dysphoric mood. The patient is not nervous/anxious.        Objective:   Physical Exam Well-developed male in no acute distress Nose without purulence or discharge noted Neck without lymphadenopathy or thyromegaly Chest totally clear to auscultation, no wheezing Cardiac exam with regular rate and rhythm Lower extremities without edema, no cyanosis Alert and oriented, moves all 4 extremities.       Assessment & Plan:

## 2013-12-02 NOTE — Patient Instructions (Signed)
Stay on singulair everyday and can take zyrtec or allegra at bedtime to see if helps with hoarseness. Will give you a sample of albuterol for rescue.  It is a different mechanism, and nurse will show you how to operate. followup with me in one year, and will check simple breathing tests next visit.

## 2013-12-09 ENCOUNTER — Other Ambulatory Visit: Payer: Self-pay | Admitting: Pulmonary Disease

## 2014-01-08 ENCOUNTER — Other Ambulatory Visit: Payer: Self-pay | Admitting: Pulmonary Disease

## 2014-02-06 ENCOUNTER — Other Ambulatory Visit: Payer: Self-pay | Admitting: Pulmonary Disease

## 2014-02-26 DIAGNOSIS — Z23 Encounter for immunization: Secondary | ICD-10-CM | POA: Diagnosis not present

## 2014-03-03 ENCOUNTER — Encounter: Payer: Self-pay | Admitting: Gastroenterology

## 2014-04-17 DIAGNOSIS — Z85828 Personal history of other malignant neoplasm of skin: Secondary | ICD-10-CM | POA: Diagnosis not present

## 2014-04-17 DIAGNOSIS — L821 Other seborrheic keratosis: Secondary | ICD-10-CM | POA: Diagnosis not present

## 2014-04-17 DIAGNOSIS — D0472 Carcinoma in situ of skin of left lower limb, including hip: Secondary | ICD-10-CM | POA: Diagnosis not present

## 2014-04-17 DIAGNOSIS — D485 Neoplasm of uncertain behavior of skin: Secondary | ICD-10-CM | POA: Diagnosis not present

## 2014-04-28 DIAGNOSIS — E785 Hyperlipidemia, unspecified: Secondary | ICD-10-CM | POA: Diagnosis not present

## 2014-04-28 DIAGNOSIS — Z0000 Encounter for general adult medical examination without abnormal findings: Secondary | ICD-10-CM | POA: Diagnosis not present

## 2014-04-28 DIAGNOSIS — Z125 Encounter for screening for malignant neoplasm of prostate: Secondary | ICD-10-CM | POA: Diagnosis not present

## 2014-04-28 DIAGNOSIS — R946 Abnormal results of thyroid function studies: Secondary | ICD-10-CM | POA: Diagnosis not present

## 2014-05-08 ENCOUNTER — Other Ambulatory Visit: Payer: Self-pay | Admitting: Gastroenterology

## 2014-05-08 DIAGNOSIS — D0472 Carcinoma in situ of skin of left lower limb, including hip: Secondary | ICD-10-CM | POA: Diagnosis not present

## 2014-05-08 DIAGNOSIS — Z85828 Personal history of other malignant neoplasm of skin: Secondary | ICD-10-CM | POA: Diagnosis not present

## 2014-05-12 DIAGNOSIS — Z125 Encounter for screening for malignant neoplasm of prostate: Secondary | ICD-10-CM | POA: Diagnosis not present

## 2014-05-12 DIAGNOSIS — Z1389 Encounter for screening for other disorder: Secondary | ICD-10-CM | POA: Diagnosis not present

## 2014-05-12 DIAGNOSIS — J45909 Unspecified asthma, uncomplicated: Secondary | ICD-10-CM | POA: Diagnosis not present

## 2014-05-12 DIAGNOSIS — Z0000 Encounter for general adult medical examination without abnormal findings: Secondary | ICD-10-CM | POA: Diagnosis not present

## 2014-05-12 DIAGNOSIS — Z6828 Body mass index (BMI) 28.0-28.9, adult: Secondary | ICD-10-CM | POA: Diagnosis not present

## 2014-05-12 DIAGNOSIS — K219 Gastro-esophageal reflux disease without esophagitis: Secondary | ICD-10-CM | POA: Diagnosis not present

## 2014-05-12 DIAGNOSIS — E785 Hyperlipidemia, unspecified: Secondary | ICD-10-CM | POA: Diagnosis not present

## 2014-05-14 ENCOUNTER — Ambulatory Visit: Payer: Medicare Other | Admitting: Gastroenterology

## 2014-05-14 ENCOUNTER — Encounter: Payer: Self-pay | Admitting: Gastroenterology

## 2014-05-14 ENCOUNTER — Ambulatory Visit (INDEPENDENT_AMBULATORY_CARE_PROVIDER_SITE_OTHER): Payer: Medicare Other | Admitting: Gastroenterology

## 2014-05-14 VITALS — BP 90/62 | HR 72 | Ht 70.0 in | Wt 193.4 lb

## 2014-05-14 DIAGNOSIS — K219 Gastro-esophageal reflux disease without esophagitis: Secondary | ICD-10-CM | POA: Diagnosis not present

## 2014-05-14 DIAGNOSIS — Z1212 Encounter for screening for malignant neoplasm of rectum: Secondary | ICD-10-CM | POA: Diagnosis not present

## 2014-05-14 MED ORDER — OMEPRAZOLE 40 MG PO CPDR: 40.0000 mg | DELAYED_RELEASE_CAPSULE | Freq: Two times a day (BID) | ORAL | Status: DC

## 2014-05-14 NOTE — Progress Notes (Signed)
    History of Present Illness: This is a  [age] year old male with GERD and LPR. His symptoms are well-controlled on omeprazole 40 mg twice daily. He has occasional gas and flatulence that responds to Gas-X.  Current Medications, Allergies, Past Medical History, Past Surgical History, Family History and Social History were reviewed in [employer] record.  Physical Exam: General: Well developed , well nourished, no acute distress Head: Normocephalic and atraumatic Eyes:  sclerae anicteric, EOMI Ears: Normal auditory acuity Mouth: No deformity or lesions Lungs: Clear throughout to auscultation Heart: Regular rate and rhythm; no murmurs, rubs or bruits Abdomen: Soft, non tender and non distended. No masses, hepatosplenomegaly or hernias noted. Normal Bowel sounds Musculoskeletal: Symmetrical with no gross deformities  Pulses:  Normal pulses noted Extremities: No clubbing, cyanosis, edema or deformities noted Neurological: Alert oriented x 4, grossly nonfocal Psychological:  Alert and cooperative. Normal mood and affect  Assessment and Recommendations:  1. GERD with LPR. Continue standard antireflux measures and omeprazole 40 mg twice a day.  2. Gas and flatulence. Low gas diet and Gas-X 4 times a day when necessary.

## 2014-05-14 NOTE — Patient Instructions (Signed)
We have sent the following medications to your pharmacy for you to pick up at your convenience:  Omeprazole  You may use Gas X over the counter as needed for gas.  Please follow up with Dr. Fuller Plan in one year

## 2014-07-02 ENCOUNTER — Other Ambulatory Visit: Payer: Self-pay | Admitting: Gastroenterology

## 2014-07-16 DIAGNOSIS — L570 Actinic keratosis: Secondary | ICD-10-CM | POA: Diagnosis not present

## 2014-07-16 DIAGNOSIS — D485 Neoplasm of uncertain behavior of skin: Secondary | ICD-10-CM | POA: Diagnosis not present

## 2014-07-16 DIAGNOSIS — Z85828 Personal history of other malignant neoplasm of skin: Secondary | ICD-10-CM | POA: Diagnosis not present

## 2014-07-16 DIAGNOSIS — L821 Other seborrheic keratosis: Secondary | ICD-10-CM | POA: Diagnosis not present

## 2014-07-16 DIAGNOSIS — B351 Tinea unguium: Secondary | ICD-10-CM | POA: Diagnosis not present

## 2014-07-21 NOTE — Progress Notes (Signed)
Patient ID: Erik Gomez, male   DOB: [birth year], 74 y.o.   MRN: [id number]   Received fax from [employer] review that omeprazole 40 mg is approved until 07/20/2015.

## 2014-07-30 ENCOUNTER — Other Ambulatory Visit: Payer: Self-pay

## 2014-07-30 MED ORDER — OMEPRAZOLE 40 MG PO CPDR: 40.0000 mg | DELAYED_RELEASE_CAPSULE | Freq: Two times a day (BID) | ORAL | Status: DC

## 2014-07-31 ENCOUNTER — Other Ambulatory Visit: Payer: Self-pay | Admitting: *Deleted

## 2014-07-31 MED ORDER — MONTELUKAST SODIUM 10 MG PO TABS: 10.0000 mg | ORAL_TABLET | Freq: Every day | ORAL | Status: DC

## 2014-11-07 ENCOUNTER — Ambulatory Visit (INDEPENDENT_AMBULATORY_CARE_PROVIDER_SITE_OTHER): Payer: Medicare Other | Admitting: Pulmonary Disease

## 2014-11-07 ENCOUNTER — Encounter: Payer: Self-pay | Admitting: Pulmonary Disease

## 2014-11-07 VITALS — BP 128/68 | HR 58 | Temp 97.2°F | Ht 70.5 in | Wt 191.2 lb

## 2014-11-07 DIAGNOSIS — J45909 Unspecified asthma, uncomplicated: Secondary | ICD-10-CM

## 2014-11-07 DIAGNOSIS — J3089 Other allergic rhinitis: Secondary | ICD-10-CM | POA: Diagnosis not present

## 2014-11-07 MED ORDER — ALBUTEROL SULFATE 108 (90 BASE) MCG/ACT IN AEPB: 2.0000 | INHALATION_SPRAY | Freq: Four times a day (QID) | RESPIRATORY_TRACT | Status: DC | PRN

## 2014-11-07 MED ORDER — AZELASTINE-FLUTICASONE 137-50 MCG/ACT NA SUSP: 2.0000 | Freq: Every day | NASAL | Status: DC

## 2014-11-07 NOTE — Assessment & Plan Note (Signed)
The patient overall feels that he is completely stable from an asthma standpoint, and has not required his rescue inhaler. I remained concerned about him being on Singulair alone, and unfortunately he has been intolerant to every inhaled cortico steroid even with a spacer device.  His spirometry today does show a decline from 2 years ago that is a little more than expected for aging alone. However, the patient remains very active and works aggressively on an exercise program and is not having issues. Will continue with his current treatment regimen, but I think we need to keep an eye on his flows going forward.

## 2014-11-07 NOTE — Assessment & Plan Note (Signed)
The patient continues to have significant postnasal drip and hoarseness despite being on Singulair and an antihistamine.  Will try him on dymista to see if this helps.

## 2014-11-07 NOTE — Progress Notes (Signed)
   Subjective:    Patient ID: Erik Gomez, male    DOB: 1941-02-10, 74 y.o.   MRN: [id number]  HPI Patient comes in today for follow-up of his known asthma and allergic rhinitis. He is being maintained on Singulair alone because of complete and tolerance to inhaled steroids. He feels that he is doing very well clinically, with no acute exacerbation her rescue inhaler need. He is continuing to have issues with postnasal drip despite being on Singulair and an anti-histamine.     Review of Systems  Constitutional: Negative for fever and unexpected weight change.  HENT: Positive for congestion and postnasal drip. Negative for dental problem, ear pain, nosebleeds, rhinorrhea, sinus pressure, sneezing, sore throat and trouble swallowing.   Eyes: Negative for redness and itching.  Respiratory: Positive for cough and wheezing. Negative for chest tightness and shortness of breath.   Cardiovascular: Negative for palpitations and leg swelling.  Gastrointestinal: Negative for nausea and vomiting.  Genitourinary: Negative for dysuria.  Musculoskeletal: Negative for joint swelling.  Skin: Negative for rash.  Neurological: Negative for headaches.  Hematological: Does not bruise/bleed easily.  Psychiatric/Behavioral: Negative for dysphoric mood. The patient is not nervous/anxious.        Objective:   Physical Exam Well-developed male in no acute distress Nose without purulence or discharge noted Chest with clear breath sounds, no active wheezing Cardiac exam with regular rate and rhythm Lower extremities without edema, no cyanosis Alert and oriented, moves all 4 extremities.       Assessment & Plan:

## 2014-11-07 NOTE — Patient Instructions (Signed)
Stay on singulair for your asthma, but let us know if you have worsening symptoms Make sure you keep a rescue inhaler available.  Will give you a coupon for proair respiclick.  Take 2 inhalations every 6 hrs if needed for rescue. Try dymista 2 sprays each nostril each am for 2 weeks.  Let us know if it helps and we can call in prescription for you followup with Dr. Lake Bells in one year.  Will probably due spirometry next visit as well.

## 2014-11-24 DIAGNOSIS — H25013 Cortical age-related cataract, bilateral: Secondary | ICD-10-CM | POA: Diagnosis not present

## 2014-11-24 DIAGNOSIS — H524 Presbyopia: Secondary | ICD-10-CM | POA: Diagnosis not present

## 2014-11-24 DIAGNOSIS — H2513 Age-related nuclear cataract, bilateral: Secondary | ICD-10-CM | POA: Diagnosis not present

## 2014-11-24 DIAGNOSIS — H43813 Vitreous degeneration, bilateral: Secondary | ICD-10-CM | POA: Diagnosis not present

## 2014-12-04 ENCOUNTER — Ambulatory Visit: Payer: Medicare Other | Admitting: Pulmonary Disease

## 2015-02-28 DIAGNOSIS — Z23 Encounter for immunization: Secondary | ICD-10-CM | POA: Diagnosis not present

## 2015-03-23 ENCOUNTER — Telehealth: Payer: Self-pay | Admitting: Pulmonary Disease

## 2015-03-23 MED ORDER — MONTELUKAST SODIUM 10 MG PO TABS: 10.0000 mg | ORAL_TABLET | Freq: Every day | ORAL | Status: DC

## 2015-03-23 NOTE — Telephone Encounter (Signed)
Called and spoke to pt's wife. Pt is requesting rx refill of Singulair. Rx sent to preferred pharmacy. Pt verbalized understanding and denied any further questions or concerns at this time.

## 2015-05-20 ENCOUNTER — Ambulatory Visit (INDEPENDENT_AMBULATORY_CARE_PROVIDER_SITE_OTHER): Payer: Medicare Other | Admitting: Gastroenterology

## 2015-05-20 ENCOUNTER — Encounter: Payer: Self-pay | Admitting: Gastroenterology

## 2015-05-20 VITALS — BP 122/80 | HR 58 | Ht 70.0 in | Wt 197.6 lb

## 2015-05-20 DIAGNOSIS — K219 Gastro-esophageal reflux disease without esophagitis: Secondary | ICD-10-CM | POA: Diagnosis not present

## 2015-05-20 MED ORDER — OMEPRAZOLE 40 MG PO CPDR: 40.0000 mg | DELAYED_RELEASE_CAPSULE | Freq: Two times a day (BID) | ORAL | Status: DC

## 2015-05-20 NOTE — Patient Instructions (Signed)
We have sent the following prescriptions to your mail in pharmacy: Prilosec   If you have not heard from your mail in pharmacy within 1 week or if you have not received your medication in the mail, please contact us at [phone] so we may find out why.  Thank you for choosing me and [facility].  Pricilla Riffle. Dagoberto Ligas., MD., Marval Regal

## 2015-05-20 NOTE — Progress Notes (Signed)
    History of Present Illness: This is a 74 year old male returning for follow-up of GERD with LPR. He is accompanied by his wife. States he has had excellent control of his reflux symptoms. He continues on omeprazole 40 mg twice daily. He states about one month ago he had one day of nausea and bloating and the symptoms past and have not recurred.  Current Medications, Allergies, Past Medical History, Past Surgical History, Family History and Social History were reviewed in [employer] record.  Physical Exam: General: Well developed, well nourished, no acute distress Head: Normocephalic and atraumatic Eyes:  sclerae anicteric, EOMI Ears: Normal auditory acuity Mouth: No deformity or lesions Lungs: Clear throughout to auscultation Heart: Regular rate and rhythm; no murmurs, rubs or bruits Abdomen: Soft, non tender and non distended. No masses, hepatosplenomegaly or hernias noted. Normal Bowel sounds Musculoskeletal: Symmetrical with no gross deformities  Pulses:  Normal pulses noted Extremities: No clubbing, cyanosis, edema or deformities noted Neurological: Alert oriented x 4, grossly nonfocal Psychological:  Alert and cooperative. Normal mood and affect  Assessment and Recommendations:  1. GERD with LPR. Continue all standard antireflux measures and refill omeprazole 40 mg twice daily.

## 2015-06-16 DIAGNOSIS — Z125 Encounter for screening for malignant neoplasm of prostate: Secondary | ICD-10-CM | POA: Diagnosis not present

## 2015-06-16 DIAGNOSIS — E784 Other hyperlipidemia: Secondary | ICD-10-CM | POA: Diagnosis not present

## 2015-06-24 DIAGNOSIS — Z1389 Encounter for screening for other disorder: Secondary | ICD-10-CM | POA: Diagnosis not present

## 2015-06-24 DIAGNOSIS — E784 Other hyperlipidemia: Secondary | ICD-10-CM | POA: Diagnosis not present

## 2015-06-24 DIAGNOSIS — Z6828 Body mass index (BMI) 28.0-28.9, adult: Secondary | ICD-10-CM | POA: Diagnosis not present

## 2015-06-24 DIAGNOSIS — J45909 Unspecified asthma, uncomplicated: Secondary | ICD-10-CM | POA: Diagnosis not present

## 2015-06-24 DIAGNOSIS — K219 Gastro-esophageal reflux disease without esophagitis: Secondary | ICD-10-CM | POA: Diagnosis not present

## 2015-06-24 DIAGNOSIS — R946 Abnormal results of thyroid function studies: Secondary | ICD-10-CM | POA: Diagnosis not present

## 2015-06-24 DIAGNOSIS — J309 Allergic rhinitis, unspecified: Secondary | ICD-10-CM | POA: Diagnosis not present

## 2015-06-24 DIAGNOSIS — H9190 Unspecified hearing loss, unspecified ear: Secondary | ICD-10-CM | POA: Diagnosis not present

## 2015-06-24 DIAGNOSIS — Z0000 Encounter for general adult medical examination without abnormal findings: Secondary | ICD-10-CM | POA: Diagnosis not present

## 2015-07-22 DIAGNOSIS — L570 Actinic keratosis: Secondary | ICD-10-CM | POA: Diagnosis not present

## 2015-07-22 DIAGNOSIS — L821 Other seborrheic keratosis: Secondary | ICD-10-CM | POA: Diagnosis not present

## 2015-07-22 DIAGNOSIS — Z85828 Personal history of other malignant neoplasm of skin: Secondary | ICD-10-CM | POA: Diagnosis not present

## 2015-07-22 DIAGNOSIS — L111 Transient acantholytic dermatosis [Grover]: Secondary | ICD-10-CM | POA: Diagnosis not present

## 2015-07-22 DIAGNOSIS — D485 Neoplasm of uncertain behavior of skin: Secondary | ICD-10-CM | POA: Diagnosis not present

## 2015-07-22 DIAGNOSIS — L739 Follicular disorder, unspecified: Secondary | ICD-10-CM | POA: Diagnosis not present

## 2015-07-22 DIAGNOSIS — L814 Other melanin hyperpigmentation: Secondary | ICD-10-CM | POA: Diagnosis not present

## 2015-11-25 DIAGNOSIS — H524 Presbyopia: Secondary | ICD-10-CM | POA: Diagnosis not present

## 2015-11-25 DIAGNOSIS — H25013 Cortical age-related cataract, bilateral: Secondary | ICD-10-CM | POA: Diagnosis not present

## 2015-11-25 DIAGNOSIS — H43813 Vitreous degeneration, bilateral: Secondary | ICD-10-CM | POA: Diagnosis not present

## 2015-11-25 DIAGNOSIS — H2513 Age-related nuclear cataract, bilateral: Secondary | ICD-10-CM | POA: Diagnosis not present

## 2015-11-26 ENCOUNTER — Encounter: Payer: Self-pay | Admitting: Pulmonary Disease

## 2015-11-26 ENCOUNTER — Ambulatory Visit (INDEPENDENT_AMBULATORY_CARE_PROVIDER_SITE_OTHER): Payer: Medicare Other | Admitting: Pulmonary Disease

## 2015-11-26 VITALS — BP 128/74 | HR 70 | Ht 70.0 in | Wt 196.7 lb

## 2015-11-26 DIAGNOSIS — J45909 Unspecified asthma, uncomplicated: Secondary | ICD-10-CM

## 2015-11-26 NOTE — Progress Notes (Signed)
Subjective:    Patient ID: Erik Gomez, male    DOB: Jul 17, 1940, 75 y.o.   MRN: [id number]  Synopsis: Former patient of Dr. Gwenette Greet with Asthma, Dr. Gwenette Greet summarized his situation as follows: Smoked 1 ppd for about 4-5 years, quit in his early 20's. [name] 2009:  FEV1 2.1 liters (61%), ratio 57 Spiro 2013:  FEV1 2.08 liters, ratio 52. [name] 2014:  FEV1 1.98 liters, ratio 55.  [name] 2016:  FEV1 1.87 (54%), ratio 57 Completely intolerant to all inhaled steroids due to thrush and dysphonia, even with spacer.  Maintaining on singulair alone.  HPI Chief Complaint  Patient presents with  . Follow-up    former [facility] pt being treated for intrinsic asthma- pt states he is doing well, notes sob with exertion.     [name] is here to establish care with me after he was cared for by Dr. Gwenette Greet for asthma. He did not have asthma as a child.   He had adult onset asthma which really gave him a lot of trouble over 10 years ago.  He was diagnosed with asthma by Dr. Gwenette Greet.  He never had any allergy testing.  Albuterol inhalers have really helped him.  He continues to take singulair, but was intolerant of inhaled corticosteroids due to hoarseness and thrush. The patient doesn't remember that.  Wheezing> still happens occasionally, he says mostly at night when he lies down; he thinks this is related to sinus drainage; he does not use albuterol very often (hasn't touch it in 5 years).    He has not had an asthma attack in 5 years.  Dyspnea> he doesn't feel this is a problem, only has this occasionally with exertion, he remains active and still goes to the gym regularly.  At the gym he has no dyspnea.   Past Medical History  Diagnosis Date  . Internal hemorrhoid   . Arthritis   . Chronic headache   . Peyronie disease   . Hiatal hernia   . GERD (gastroesophageal reflux disease)     w/ LPR  . Vitamin D deficiency   . Cataract     per pt "beginning of a cataract"  . Asthma, intrinsic        Review of Systems     Objective:   Physical Exam Filed Vitals:   11/26/15 1402  BP: 128/74  Pulse: 70  Height: 5\' 10"  (1.778 m)  Weight: 196 lb 11.2 oz (89.223 kg)  SpO2: 95%  RA  Gen: well appearing HENT: OP clear, TM's clear, neck supple PULM: CTA B, normal percussion CV: RRR, no mgr, trace edema GI: BS+, soft, nontender Derm: no cyanosis or rash Psyche: normal mood and affect  Dr. Janifer Adie notes reviewed today where he was cared for his asthma     Assessment & Plan:  Intrinsic asthma This has been a stable interval for him. He remains well controlled on Singulair. He has not had any exacerbations in the last year.  Despite his chronic airflow obstruction, he remains completely asymptomatic.  Today we spent a significant amount of time talking about the importance of hand hygiene and immunizations. He says he had an allergy to the pneumonia vaccine in the past so I think it's reasonable to stay away from the Prevnar vaccine. I have asked him to talk to his pharmacist about this.  Plan: Continue Singulair As needed albuterol Follow-up one year or sooner if needed   > 50% of today's 25 minute visit spent face to  face  Current outpatient prescriptions:  .  Acetaminophen (TYLENOL PO), Per bottle as needed, Disp: , Rfl:  .  Albuterol Sulfate (PROAIR RESPICLICK) 123XX123 (90 BASE) MCG/ACT AEPB, Inhale 2 puffs into the lungs every 6 (six) hours as needed., Disp: 1 each, Rfl: 4 .  cetirizine (ZYRTEC) 10 MG tablet, Take 10 mg by mouth as needed., Disp: , Rfl:  .  montelukast (SINGULAIR) 10 MG tablet, Take 1 tablet (10 mg total) by mouth daily., Disp: 90 tablet, Rfl: 3 .  omeprazole (PRILOSEC) 40 MG capsule, Take 1 capsule (40 mg total) by mouth 2 (two) times daily., Disp: 180 capsule, Rfl: 3 .  vitamin D, CHOLECALCIFEROL, 400 UNITS tablet, Take 1,000 Units by mouth daily. , Disp: , Rfl:

## 2015-11-26 NOTE — Patient Instructions (Signed)
Talk to your pharmacist about taking Prevnar Keep taking Singulair Use albuterol as needed for shortness of breath We will see you back in one year or sooner if needed

## 2015-11-26 NOTE — Assessment & Plan Note (Signed)
This has been a stable interval for him. He remains well controlled on Singulair. He has not had any exacerbations in the last year.  Despite his chronic airflow obstruction, he remains completely asymptomatic.  Today we spent a significant amount of time talking about the importance of hand hygiene and immunizations. He says he had an allergy to the pneumonia vaccine in the past so I think it's reasonable to stay away from the Prevnar vaccine. I have asked him to talk to his pharmacist about this.  Plan: Continue Singulair As needed albuterol Follow-up one year or sooner if needed

## 2015-12-31 DIAGNOSIS — H25812 Combined forms of age-related cataract, left eye: Secondary | ICD-10-CM | POA: Diagnosis not present

## 2015-12-31 DIAGNOSIS — H2512 Age-related nuclear cataract, left eye: Secondary | ICD-10-CM | POA: Diagnosis not present

## 2015-12-31 DIAGNOSIS — H25012 Cortical age-related cataract, left eye: Secondary | ICD-10-CM | POA: Diagnosis not present

## 2016-01-21 DIAGNOSIS — H25011 Cortical age-related cataract, right eye: Secondary | ICD-10-CM | POA: Diagnosis not present

## 2016-01-21 DIAGNOSIS — H2511 Age-related nuclear cataract, right eye: Secondary | ICD-10-CM | POA: Diagnosis not present

## 2016-01-21 DIAGNOSIS — H25811 Combined forms of age-related cataract, right eye: Secondary | ICD-10-CM | POA: Diagnosis not present

## 2016-02-20 DIAGNOSIS — Z23 Encounter for immunization: Secondary | ICD-10-CM | POA: Diagnosis not present

## 2016-03-16 ENCOUNTER — Other Ambulatory Visit: Payer: Self-pay | Admitting: Adult Health

## 2016-06-02 ENCOUNTER — Encounter (INDEPENDENT_AMBULATORY_CARE_PROVIDER_SITE_OTHER): Payer: Self-pay

## 2016-06-02 ENCOUNTER — Ambulatory Visit (INDEPENDENT_AMBULATORY_CARE_PROVIDER_SITE_OTHER): Payer: Medicare Other | Admitting: Gastroenterology

## 2016-06-02 ENCOUNTER — Telehealth: Payer: Self-pay | Admitting: Pulmonary Disease

## 2016-06-02 ENCOUNTER — Encounter: Payer: Self-pay | Admitting: Gastroenterology

## 2016-06-02 VITALS — BP 124/74 | HR 64 | Ht 70.0 in | Wt 197.4 lb

## 2016-06-02 DIAGNOSIS — Z1212 Encounter for screening for malignant neoplasm of rectum: Secondary | ICD-10-CM

## 2016-06-02 DIAGNOSIS — K219 Gastro-esophageal reflux disease without esophagitis: Secondary | ICD-10-CM

## 2016-06-02 DIAGNOSIS — Z1211 Encounter for screening for malignant neoplasm of colon: Secondary | ICD-10-CM

## 2016-06-02 MED ORDER — ALBUTEROL SULFATE 108 (90 BASE) MCG/ACT IN AEPB: 2.0000 | INHALATION_SPRAY | Freq: Four times a day (QID) | RESPIRATORY_TRACT | 0 refills | Status: DC | PRN

## 2016-06-02 MED ORDER — OMEPRAZOLE 40 MG PO CPDR: 40.0000 mg | DELAYED_RELEASE_CAPSULE | Freq: Two times a day (BID) | ORAL | 3 refills | Status: DC

## 2016-06-02 NOTE — Progress Notes (Signed)
    History of Present Illness: This is a he is accompanied by his wife. His symptoms are under excellent control. Several years ago he tried to decrease omeprazole to daily and he had breakthrough symptoms and he wonders about trying this again. He also asked about colon cancer screening.  Current Medications, Allergies, Past Medical History, Past Surgical History, Family History and Social History were reviewed in [employer] record.  Physical Exam: General: Well developed, well nourished, no acute distress Head: Normocephalic and atraumatic Eyes:  sclerae anicteric, EOMI Ears: Normal auditory acuity Mouth: No deformity or lesions Lungs: Clear throughout to auscultation Heart: Regular rate and rhythm; no murmurs, rubs or bruits Abdomen: Soft, non tender and non distended. No masses, hepatosplenomegaly or hernias noted. Normal Bowel sounds Musculoskeletal: Symmetrical with no gross deformities  Pulses:  Normal pulses noted Extremities: No clubbing, cyanosis, edema or deformities noted Neurological: Alert oriented x 4, grossly nonfocal Psychological:  Alert and cooperative. Normal mood and affect  Assessment and Recommendations:  1. GERD with LPR. Continue omeprazole 40 mg twice daily. He can try omeprazole 40 mg every morning symptoms are well controlled he can remain on daily omeprazole. If they are not well controlled resume omeprazole twice a day.  2 CRC screening, average risk. Last colonoscopy in 2013 showed only internal hemorrhoids. He will be due for a 10 year interval screening colonoscopy at age 75 and we discussed the decision to have colonoscopy as we generally recommend discontinuing CRC screening about age 75.   I spent 15 minutes of face-to-face time with the patient. Greater than 50% of the time was spent counseling and coordinating care.

## 2016-06-02 NOTE — Patient Instructions (Signed)
We have sent the following prescriptions to your mail in pharmacy: omeprazole.   If you have not heard from your mail in pharmacy within 1 week or if you have not received your medication in the mail, please contact us at [phone] so we may find out why.  Thank you for choosing me and  [facility].  Malcolm T. Stark, Jr., MD., FACG  

## 2016-06-02 NOTE — Telephone Encounter (Signed)
Pt requesting proair respiclick samples. Pt has been given a sample. Pt voiced understanding and had no further questions. Nothing further needed.

## 2016-06-06 HISTORY — PX: CATARACT EXTRACTION, BILATERAL: SHX1313

## 2016-06-24 DIAGNOSIS — Z125 Encounter for screening for malignant neoplasm of prostate: Secondary | ICD-10-CM | POA: Diagnosis not present

## 2016-06-24 DIAGNOSIS — R946 Abnormal results of thyroid function studies: Secondary | ICD-10-CM | POA: Diagnosis not present

## 2016-06-24 DIAGNOSIS — E784 Other hyperlipidemia: Secondary | ICD-10-CM | POA: Diagnosis not present

## 2016-06-30 DIAGNOSIS — Z1389 Encounter for screening for other disorder: Secondary | ICD-10-CM | POA: Diagnosis not present

## 2016-06-30 DIAGNOSIS — H9319 Tinnitus, unspecified ear: Secondary | ICD-10-CM | POA: Diagnosis not present

## 2016-06-30 DIAGNOSIS — J45909 Unspecified asthma, uncomplicated: Secondary | ICD-10-CM | POA: Diagnosis not present

## 2016-06-30 DIAGNOSIS — K219 Gastro-esophageal reflux disease without esophagitis: Secondary | ICD-10-CM | POA: Diagnosis not present

## 2016-06-30 DIAGNOSIS — Z6828 Body mass index (BMI) 28.0-28.9, adult: Secondary | ICD-10-CM | POA: Diagnosis not present

## 2016-06-30 DIAGNOSIS — Z0000 Encounter for general adult medical examination without abnormal findings: Secondary | ICD-10-CM | POA: Diagnosis not present

## 2016-06-30 DIAGNOSIS — R946 Abnormal results of thyroid function studies: Secondary | ICD-10-CM | POA: Diagnosis not present

## 2016-06-30 DIAGNOSIS — I872 Venous insufficiency (chronic) (peripheral): Secondary | ICD-10-CM | POA: Diagnosis not present

## 2016-06-30 DIAGNOSIS — E784 Other hyperlipidemia: Secondary | ICD-10-CM | POA: Diagnosis not present

## 2016-06-30 DIAGNOSIS — H9190 Unspecified hearing loss, unspecified ear: Secondary | ICD-10-CM | POA: Diagnosis not present

## 2016-06-30 DIAGNOSIS — J309 Allergic rhinitis, unspecified: Secondary | ICD-10-CM | POA: Diagnosis not present

## 2016-07-06 ENCOUNTER — Telehealth: Payer: Self-pay | Admitting: Pulmonary Disease

## 2016-07-06 DIAGNOSIS — J111 Influenza due to unidentified influenza virus with other respiratory manifestations: Secondary | ICD-10-CM | POA: Diagnosis not present

## 2016-07-06 DIAGNOSIS — J45909 Unspecified asthma, uncomplicated: Secondary | ICD-10-CM | POA: Diagnosis not present

## 2016-07-06 DIAGNOSIS — R05 Cough: Secondary | ICD-10-CM | POA: Diagnosis not present

## 2016-07-06 DIAGNOSIS — R5381 Other malaise: Secondary | ICD-10-CM | POA: Diagnosis not present

## 2016-07-06 DIAGNOSIS — Z6827 Body mass index (BMI) 27.0-27.9, adult: Secondary | ICD-10-CM | POA: Diagnosis not present

## 2016-07-07 NOTE — Telephone Encounter (Signed)
Called and spoke with pt and he is aware of how to use the proair respiclick.  He was not aware of how often and how many puffs to take.  Nothing further is needed.

## 2016-07-13 DIAGNOSIS — Z1212 Encounter for screening for malignant neoplasm of rectum: Secondary | ICD-10-CM | POA: Diagnosis not present

## 2016-07-28 DIAGNOSIS — Z85828 Personal history of other malignant neoplasm of skin: Secondary | ICD-10-CM | POA: Diagnosis not present

## 2016-07-28 DIAGNOSIS — D2261 Melanocytic nevi of right upper limb, including shoulder: Secondary | ICD-10-CM | POA: Diagnosis not present

## 2016-07-28 DIAGNOSIS — D2262 Melanocytic nevi of left upper limb, including shoulder: Secondary | ICD-10-CM | POA: Diagnosis not present

## 2016-07-28 DIAGNOSIS — D225 Melanocytic nevi of trunk: Secondary | ICD-10-CM | POA: Diagnosis not present

## 2016-07-28 DIAGNOSIS — L853 Xerosis cutis: Secondary | ICD-10-CM | POA: Diagnosis not present

## 2016-07-28 DIAGNOSIS — L570 Actinic keratosis: Secondary | ICD-10-CM | POA: Diagnosis not present

## 2016-11-28 ENCOUNTER — Ambulatory Visit (INDEPENDENT_AMBULATORY_CARE_PROVIDER_SITE_OTHER): Payer: Medicare Other | Admitting: Pulmonary Disease

## 2016-11-28 ENCOUNTER — Encounter: Payer: Self-pay | Admitting: Pulmonary Disease

## 2016-11-28 ENCOUNTER — Ambulatory Visit (INDEPENDENT_AMBULATORY_CARE_PROVIDER_SITE_OTHER)
Admission: RE | Admit: 2016-11-28 | Discharge: 2016-11-28 | Disposition: A | Discharge status: Home / Self Care | Payer: Medicare Other | Source: Ambulatory Visit | Attending: Pulmonary Disease | Admitting: Pulmonary Disease

## 2016-11-28 VITALS — BP 120/60 | HR 71 | Ht 70.5 in | Wt 192.4 lb

## 2016-11-28 DIAGNOSIS — J45909 Unspecified asthma, uncomplicated: Secondary | ICD-10-CM

## 2016-11-28 DIAGNOSIS — R918 Other nonspecific abnormal finding of lung field: Secondary | ICD-10-CM | POA: Insufficient documentation

## 2016-11-28 DIAGNOSIS — J9811 Atelectasis: Secondary | ICD-10-CM | POA: Diagnosis not present

## 2016-11-28 MED ORDER — ALBUTEROL SULFATE 108 (90 BASE) MCG/ACT IN AEPB: 2.0000 | INHALATION_SPRAY | Freq: Four times a day (QID) | RESPIRATORY_TRACT | 11 refills | Status: DC | PRN

## 2016-11-28 NOTE — Assessment & Plan Note (Signed)
Despite having the flu that sure this is been a stable interval for him. He has no symptoms of shortness of breath in between his seasonal allergies. Even when springtime hits he only needs to use albuterol sparingly.  Plan: Continue Singulair Continue as needed albuterol Follow-up one year We discussed the importance of hand hygiene again today. Recommended the flu shot in the fall

## 2016-11-28 NOTE — Assessment & Plan Note (Addendum)
New problem.  He had some crackles on lung exam today which I did not expect to hear. He has had no other symptoms otherwise which is suggest active lung disease which is reassuring. We will check a chest x-ray to ensure there is nothing else going on.

## 2016-11-28 NOTE — Addendum Note (Signed)
Addended by: Len Blalock on: 11/28/2016 01:48 PM   Modules accepted: Orders

## 2016-11-28 NOTE — Patient Instructions (Signed)
Keep taking your Singulair as you are doing Let us know if you have any breathing difficulty We will see you back in one year or sooner if needed

## 2016-11-28 NOTE — Progress Notes (Signed)
Subjective:    Patient ID: Erik Gomez, male    DOB: [birth year], 76 y.o.   MRN: [id number]  Synopsis: Former patient of Dr. Gwenette Greet with Asthma, Dr. Gwenette Greet summarized his situation as follows: Smoked 1 ppd for about 4-5 years, quit in his early 20's. [name] 2009:  FEV1 2.1 liters (61%), ratio 57 Spiro 2013:  FEV1 2.08 liters, ratio 52. [name] 2014:  FEV1 1.98 liters, ratio 55.  [name] 2016:  FEV1 1.87 (54%), ratio 57 Completely intolerant to all inhaled steroids due to thrush and dysphonia, even with spacer.  Maintaining on singulair alone.  HPI Chief Complaint  Patient presents with  . Follow-up    Pt states that he is wheezing more at night but not noticing it as much during the day. States that the wheezing at night has gotten worse than it was at last visit. Denies SOB, coughing, or chest pain. Pt has not had to use his Albuterol inhaler within the last few weeks but when the pollen season was bad, states that he had to use it just a few times during that period.   He says that he had a little wheezing in the pollen season requiring albuterol maybe 3-4 times throughout the spring.  He never needed it more than twice per day.  He had the flu and was sick for three weeks and felt like he came close to pneumonia.  He was treated with tamiflu.  His wife had the flu too.    He is still taking singulair.    Past Medical History:  Diagnosis Date  . Arthritis   . Asthma, intrinsic   . Cataract    per pt "beginning of a cataract"  . Chronic headache   . GERD (gastroesophageal reflux disease)    w/ LPR  . Hiatal hernia   . Internal hemorrhoid   . Peyronie disease   . Vitamin D deficiency       Review of Systems  Constitutional: Negative for chills and fever.  HENT: Negative for sinus pain and sinus pressure.   Respiratory: Positive for wheezing. Negative for cough and shortness of breath.   Cardiovascular: Negative for chest pain, palpitations and leg swelling.         Objective:   Physical Exam Vitals:   11/28/16 1332 11/28/16 1334  BP:  120/60  Pulse: 71   SpO2: 96%   Weight: 192 lb 6.4 oz (87.3 kg)   Height: 5' 10.5" (1.791 m)    Room air   Gen: well appearing HENT: OP clear, TM's clear, neck supple PULM: CTA B, normal percussion CV: RRR, no mgr, trace edema GI: BS+, soft, nontender Derm: no cyanosis or rash Psyche: normal mood and affect  Records reviewed no recent chest x-rays  2005 barium swallow records reviewed but no chest imaging available          Assessment & Plan:  Lung field abnormal finding on examination He had some crackles on lung exam today which I did not expect to hear. He has had no other symptoms otherwise which is suggest active lung disease which is reassuring. We will check a chest x-ray to ensure there is nothing else going on.  Intrinsic asthma Despite having the flu that sure this is been a stable interval for him. He has no symptoms of shortness of breath in between his seasonal allergies. Even when springtime hits he only needs to use albuterol sparingly.  Plan: Continue Singulair Continue as needed albuterol Follow-up one  year We discussed the importance of hand hygiene again today. Recommended the flu shot in the fall    Current Outpatient Prescriptions:  .  Acetaminophen (TYLENOL PO), Per bottle as needed, Disp: , Rfl:  .  Albuterol Sulfate (PROAIR RESPICLICK) 709 (90 BASE) MCG/ACT AEPB, Inhale 2 puffs into the lungs every 6 (six) hours as needed., Disp: 1 each, Rfl: 4 .  Biotin 1000 MCG tablet, Take 1,000 mcg by mouth daily., Disp: , Rfl:  .  cetirizine (ZYRTEC) 10 MG tablet, Take 10 mg by mouth daily at 2 PM. , Disp: , Rfl:  .  montelukast (SINGULAIR) 10 MG tablet, TAKE 1 TABLET (10 MG TOTAL) BY MOUTH DAILY., Disp: 90 tablet, Rfl: 3 .  omeprazole (PRILOSEC) 40 MG capsule, Take 1 capsule (40 mg total) by mouth 2 (two) times daily., Disp: 180 capsule, Rfl: 3 .  vitamin D, CHOLECALCIFEROL, 400  UNITS tablet, Take 1,000 Units by mouth daily. , Disp: , Rfl:

## 2016-11-30 ENCOUNTER — Telehealth: Payer: Self-pay | Admitting: Pulmonary Disease

## 2016-11-30 NOTE — Telephone Encounter (Signed)
Called and spoke to pt. Pt states he already received the results of CXR and denied any further questions or concerns at this time. Will sign off.

## 2017-02-24 DIAGNOSIS — H43813 Vitreous degeneration, bilateral: Secondary | ICD-10-CM | POA: Diagnosis not present

## 2017-02-24 DIAGNOSIS — H52203 Unspecified astigmatism, bilateral: Secondary | ICD-10-CM | POA: Diagnosis not present

## 2017-02-24 DIAGNOSIS — Z961 Presence of intraocular lens: Secondary | ICD-10-CM | POA: Diagnosis not present

## 2017-03-04 DIAGNOSIS — Z23 Encounter for immunization: Secondary | ICD-10-CM | POA: Diagnosis not present

## 2017-03-15 ENCOUNTER — Other Ambulatory Visit: Payer: Self-pay | Admitting: Pulmonary Disease

## 2017-06-09 ENCOUNTER — Encounter: Payer: Self-pay | Admitting: Gastroenterology

## 2017-06-28 DIAGNOSIS — R946 Abnormal results of thyroid function studies: Secondary | ICD-10-CM | POA: Diagnosis not present

## 2017-06-28 DIAGNOSIS — R82998 Other abnormal findings in urine: Secondary | ICD-10-CM | POA: Diagnosis not present

## 2017-06-28 DIAGNOSIS — E7849 Other hyperlipidemia: Secondary | ICD-10-CM | POA: Diagnosis not present

## 2017-06-28 DIAGNOSIS — Z125 Encounter for screening for malignant neoplasm of prostate: Secondary | ICD-10-CM | POA: Diagnosis not present

## 2017-07-06 DIAGNOSIS — Z0000 Encounter for general adult medical examination without abnormal findings: Secondary | ICD-10-CM | POA: Diagnosis not present

## 2017-07-06 DIAGNOSIS — J45909 Unspecified asthma, uncomplicated: Secondary | ICD-10-CM | POA: Diagnosis not present

## 2017-07-06 DIAGNOSIS — K219 Gastro-esophageal reflux disease without esophagitis: Secondary | ICD-10-CM | POA: Diagnosis not present

## 2017-07-06 DIAGNOSIS — Z1389 Encounter for screening for other disorder: Secondary | ICD-10-CM | POA: Diagnosis not present

## 2017-07-06 DIAGNOSIS — I872 Venous insufficiency (chronic) (peripheral): Secondary | ICD-10-CM | POA: Diagnosis not present

## 2017-07-06 DIAGNOSIS — E7849 Other hyperlipidemia: Secondary | ICD-10-CM | POA: Diagnosis not present

## 2017-07-06 DIAGNOSIS — R946 Abnormal results of thyroid function studies: Secondary | ICD-10-CM | POA: Diagnosis not present

## 2017-07-06 DIAGNOSIS — Z6828 Body mass index (BMI) 28.0-28.9, adult: Secondary | ICD-10-CM | POA: Diagnosis not present

## 2017-07-06 DIAGNOSIS — H9319 Tinnitus, unspecified ear: Secondary | ICD-10-CM | POA: Diagnosis not present

## 2017-07-06 DIAGNOSIS — J309 Allergic rhinitis, unspecified: Secondary | ICD-10-CM | POA: Diagnosis not present

## 2017-07-11 DIAGNOSIS — Z1212 Encounter for screening for malignant neoplasm of rectum: Secondary | ICD-10-CM | POA: Diagnosis not present

## 2017-07-19 ENCOUNTER — Ambulatory Visit (INDEPENDENT_AMBULATORY_CARE_PROVIDER_SITE_OTHER): Payer: Medicare Other | Admitting: Gastroenterology

## 2017-07-19 ENCOUNTER — Encounter: Payer: Self-pay | Admitting: Gastroenterology

## 2017-07-19 VITALS — BP 92/60 | HR 60 | Ht 70.0 in | Wt 191.4 lb

## 2017-07-19 DIAGNOSIS — K219 Gastro-esophageal reflux disease without esophagitis: Secondary | ICD-10-CM | POA: Diagnosis not present

## 2017-07-19 MED ORDER — OMEPRAZOLE 40 MG PO CPDR: 40.0000 mg | DELAYED_RELEASE_CAPSULE | Freq: Two times a day (BID) | ORAL | 3 refills | Status: DC

## 2017-07-19 NOTE — Progress Notes (Signed)
    History of Present Illness: This is a 77 year old white male returning for follow-up of GERD.  His symptoms are under excellent control on omeprazole 40 twice daily.  He has no gastrointestinal complaints.  Current Medications, Allergies, Past Medical History, Past Surgical History, Family History and Social History were reviewed in [employer] record.  Physical Exam: General: Well developed, well nourished, no acute distress Head: Normocephalic and atraumatic Eyes:  sclerae anicteric, EOMI Ears: Normal auditory acuity Mouth: No deformity or lesions Lungs: Clear throughout to auscultation Heart: Regular rate and rhythm; no murmurs, rubs or bruits Abdomen: Soft, non tender and non distended. No masses, hepatosplenomegaly or hernias noted. Normal Bowel sounds Rectal: Not done Musculoskeletal: Symmetrical with no gross deformities  Pulses:  Normal pulses noted Extremities: No clubbing, cyanosis, edema or deformities noted Neurological: Alert oriented x 4, grossly nonfocal Psychological:  Alert and cooperative. Normal mood and affect  Assessment and Recommendations:  1. GERD with LPR.  Continue standard antireflux measures.  Continue omeprazole 40 mg p.o. twice daily. REV in 1 year.

## 2017-07-19 NOTE — Patient Instructions (Signed)
We have sent the following medications to your pharmacy for you to pick up at your convenience:omeprazole.  Thank you for choosing me and [facility].  Malcolm T. Stark, Jr., MD., FACG   

## 2017-07-26 DIAGNOSIS — L239 Allergic contact dermatitis, unspecified cause: Secondary | ICD-10-CM | POA: Diagnosis not present

## 2017-07-26 DIAGNOSIS — Z85828 Personal history of other malignant neoplasm of skin: Secondary | ICD-10-CM | POA: Diagnosis not present

## 2017-08-03 DIAGNOSIS — L309 Dermatitis, unspecified: Secondary | ICD-10-CM | POA: Diagnosis not present

## 2017-08-03 DIAGNOSIS — Z85828 Personal history of other malignant neoplasm of skin: Secondary | ICD-10-CM | POA: Diagnosis not present

## 2017-08-03 DIAGNOSIS — L430 Hypertrophic lichen planus: Secondary | ICD-10-CM | POA: Diagnosis not present

## 2017-08-03 DIAGNOSIS — D225 Melanocytic nevi of trunk: Secondary | ICD-10-CM | POA: Diagnosis not present

## 2017-08-03 DIAGNOSIS — D485 Neoplasm of uncertain behavior of skin: Secondary | ICD-10-CM | POA: Diagnosis not present

## 2017-10-06 ENCOUNTER — Encounter: Payer: Self-pay | Admitting: Podiatry

## 2017-10-06 ENCOUNTER — Ambulatory Visit (INDEPENDENT_AMBULATORY_CARE_PROVIDER_SITE_OTHER): Payer: Medicare Other | Admitting: Podiatry

## 2017-10-06 DIAGNOSIS — Q828 Other specified congenital malformations of skin: Secondary | ICD-10-CM | POA: Diagnosis not present

## 2017-11-29 ENCOUNTER — Ambulatory Visit (INDEPENDENT_AMBULATORY_CARE_PROVIDER_SITE_OTHER): Payer: Medicare Other | Admitting: Pulmonary Disease

## 2017-11-29 ENCOUNTER — Encounter: Payer: Self-pay | Admitting: Pulmonary Disease

## 2017-11-29 VITALS — BP 120/60 | HR 58 | Ht 70.5 in | Wt 190.0 lb

## 2017-11-29 DIAGNOSIS — K2100 Gastro-esophageal reflux disease with esophagitis, without bleeding: Secondary | ICD-10-CM

## 2017-11-29 DIAGNOSIS — J45909 Unspecified asthma, uncomplicated: Secondary | ICD-10-CM | POA: Diagnosis not present

## 2017-11-29 NOTE — Progress Notes (Signed)
Subjective:    Patient ID: Erik Gomez, male    DOB: 1941/01/18, 77 y.o.   MRN: [id number]  Synopsis: Former patient of Dr. Gwenette Gomez with Asthma, Dr. Gwenette Gomez summarized his situation as follows: Smoked 1 ppd for about 4-5 years, quit in his early 20's. Erik Gomez 2009:  FEV1 2.1 liters (61%), ratio 57 Spiro 2013:  FEV1 2.08 liters, ratio 52. Erik Gomez 2014:  FEV1 1.98 liters, ratio 55.  Erik Gomez 2016:  FEV1 1.87 (54%), ratio 57 Completely intolerant to all inhaled steroids due to thrush and dysphonia, even with spacer.  Maintaining on singulair alone.  HPI Chief Complaint  Patient presents with  . Follow-up    increased wheezing at night, dry cough, issues with swallowing   Erik Gomez thinks that his wheezing is worse at night.  He notices a rattle in his throat, he hears a wheeze there as well.  He will twist and turn in bed and then it will et better.  He sometimes needs to wake up and use his albuterol at night, typically about 2 x per week.  He never needs albuterol or wheezes during the daytime.  He stays quite active and never really feels limited by his breathing other than sometimes feeling washed out in the late afternoons.  He naps for about 20-30 minutes and then feels better. He doesn't really feel drowsy.  Sometimes liquids make him "strangled".  He notes some sinus congestion and cough (his wife notices it more than him).  He sneeezes more at nigt about 3-4 times.    Past Medical History:  Diagnosis Date  . Arthritis   . Asthma, intrinsic   . Cataract    per pt "beginning of a cataract"  . Chronic headache   . GERD (gastroesophageal reflux disease)    w/ LPR  . Hiatal hernia   . Internal hemorrhoid   . Peyronie disease   . Vitamin D deficiency       Review of Systems  Constitutional: Negative for chills and fever.  HENT: Negative for sinus pressure and sinus pain.   Respiratory: Positive for wheezing. Negative for cough and shortness of breath.   Cardiovascular: Negative  for chest pain, palpitations and leg swelling.       Objective:   Physical Exam Vitals:   11/29/17 0853  BP: 120/60  Pulse: (!) 58  SpO2: 96%  Weight: 190 lb (86.2 kg)  Height: 5' 10.5" (1.791 m)   Room air   Gen: well appearing HENT: OP clear, TM's clear, neck supple PULM: CTA B, normal percussion CV: RRR, no mgr, trace edema GI: BS+, soft, nontender Derm: no cyanosis or rash Psyche: normal mood and affect   Records reviewed no recent chest x-rays  2005 barium swallow records reviewed but no chest imaging available          Assessment & Plan:   Intrinsic asthma  Gastroesophageal reflux disease with esophagitis  Discussion: In general this is been a stable interval for Erik Gomez.  However he does note some wheezing in the evenings associated with mucus in his throat.  Given the fact that he has no other problems with shortness of breath, chest tightness or wheezing during the daytime and is not limited by breathing at all I suspect that the wheezing he experienced while supine is due to either gastroesophageal reflux or postnasal drip from allergic rhinitis and not asthma.  Plan: Mild persistent asthma, intolerance to inhaled corticosteroids: Continue Singulair daily Because of the increased wheezing I  am going to recommend the following: Prop of the head your bed for 2 weeks If no improvement after 2 weeks and then take Zyrtec daily, start taking Flonase 2 sprays each nostril daily (the generic form is fine) If no improvement after doing that for 2 weeks then let me know  Get a flu shot in the fall  Gastroesophageal reflux disease: Continue taking omeprazole twice a day  Follow-up with me in 1 year or sooner if needed    Current Outpatient Medications:  .  Acetaminophen (TYLENOL PO), Per bottle as needed, Disp: , Rfl:  .  Albuterol Sulfate (PROAIR RESPICLICK) 024 (90 Base) MCG/ACT AEPB, Inhale 2 puffs into the lungs every 6 (six) hours as needed.,  Disp: 1 each, Rfl: 11 .  Biotin 1000 MCG tablet, Take 1,000 mcg by mouth daily., Disp: , Rfl:  .  cetirizine (ZYRTEC) 10 MG tablet, Take 10 mg by mouth daily at 2 PM. , Disp: , Rfl:  .  montelukast (SINGULAIR) 10 MG tablet, TAKE 1 TABLET (10 MG TOTAL) BY MOUTH DAILY., Disp: 90 tablet, Rfl: 3 .  omeprazole (PRILOSEC) 40 MG capsule, Take 1 capsule (40 mg total) by mouth 2 (two) times daily., Disp: 180 capsule, Rfl: 3 .  vitamin D, CHOLECALCIFEROL, 400 UNITS tablet, Take 1,000 Units by mouth daily. , Disp: , Rfl:

## 2017-11-29 NOTE — Patient Instructions (Signed)
Mild persistent asthma, intolerance to inhaled corticosteroids: Continue Singulair daily Because of the increased wheezing I am going to recommend the following: Prop of the head your bed for 2 weeks If no improvement after 2 weeks and then take Zyrtec daily, start taking Flonase 2 sprays each nostril daily (the generic form is fine) If no improvement after doing that for 2 weeks then let me know  Get a flu shot in the fall  Gastroesophageal reflux disease: Continue taking omeprazole twice a day  Follow-up with me in 1 year or sooner if needed

## 2017-12-24 NOTE — Progress Notes (Signed)
  Subjective:  Patient ID: Erik Gomez, male    DOB: 1940-08-05,  MRN: [id number]  Chief Complaint  Patient presents with  . Plantar Warts    Lt foot sub 3rd and 4th painful    77 y.o. male presents with the above complaint.  Past Medical History:  Diagnosis Date  . Arthritis   . Asthma, intrinsic   . Cataract    per pt "beginning of a cataract"  . Chronic headache   . GERD (gastroesophageal reflux disease)    w/ LPR  . Hiatal hernia   . Internal hemorrhoid   . Peyronie disease   . Vitamin D deficiency    Past Surgical History:  Procedure Laterality Date  . COLONOSCOPY    . FINGER SURGERY     middle finger of right hand  . KNEE SURGERY     Left  . Peyronie's Disease surgery  2001  . SKIN CANCER EXCISION     Right leg    Current Outpatient Medications:  .  Acetaminophen (TYLENOL PO), Per bottle as needed, Disp: , Rfl:  .  Albuterol Sulfate (PROAIR RESPICLICK) 500 (90 Base) MCG/ACT AEPB, Inhale 2 puffs into the lungs every 6 (six) hours as needed., Disp: 1 each, Rfl: 11 .  Biotin 1000 MCG tablet, Take 1,000 mcg by mouth daily., Disp: , Rfl:  .  cetirizine (ZYRTEC) 10 MG tablet, Take 10 mg by mouth daily at 2 PM. , Disp: , Rfl:  .  montelukast (SINGULAIR) 10 MG tablet, TAKE 1 TABLET (10 MG TOTAL) BY MOUTH DAILY., Disp: 90 tablet, Rfl: 3 .  omeprazole (PRILOSEC) 40 MG capsule, Take 1 capsule (40 mg total) by mouth 2 (two) times daily., Disp: 180 capsule, Rfl: 3 .  vitamin D, CHOLECALCIFEROL, 400 UNITS tablet, Take 1,000 Units by mouth daily. , Disp: , Rfl:   Allergies  Allergen Reactions  . Codeine     Upset stomach  . Meloxicam     Upset stomach  . Pantoprazole Sodium     Upset stomach   Review of Systems: Negative except as noted in the HPI. Denies N/V/F/Ch. Objective:  There were no vitals filed for this visit. General AA&O x3. Normal mood and affect.  Vascular Dorsalis pedis and posterior tibial pulses  present 2+ bilaterally  Capillary refill normal to  all digits. Pedal hair growth normal.  Neurologic Epicritic sensation grossly present.  Dermatologic No open lesions. Interspaces clear of maceration. Nails well groomed and normal in appearance. Hyperkeratotic lesion sub-3 4 left  Orthopedic: MMT 5/5 in dorsiflexion, plantarflexion, inversion, and eversion. Normal joint ROM without pain or crepitus.   Assessment & Plan:  Patient was evaluated and treated and all questions answered.  Porokeratosis -Lesion debrided and destroyed as below  Procedure: Destruction of Lesion Location: submet 3/4 L foot Anesthesia: none Instrumentation: 15 blade. Technique: Debridement of lesion to petechial bleeding. Aperture pad applied around lesion. Small amount of salinocaine applied to the base of the lesion. Dressing: Dry, sterile, compression dressing. Disposition: Patient tolerated procedure well. Advised to leave dressing on for 6-8 hours. Thereafter patient to wash the area with soap and water and applied band-aid. Off-loading pads dispensed. Patient to return in 2 weeks for follow-up.   Return if symptoms worsen or fail to improve.

## 2018-02-02 DIAGNOSIS — Z6828 Body mass index (BMI) 28.0-28.9, adult: Secondary | ICD-10-CM | POA: Diagnosis not present

## 2018-02-02 DIAGNOSIS — J069 Acute upper respiratory infection, unspecified: Secondary | ICD-10-CM | POA: Diagnosis not present

## 2018-02-02 DIAGNOSIS — R05 Cough: Secondary | ICD-10-CM | POA: Diagnosis not present

## 2018-02-26 DIAGNOSIS — H43813 Vitreous degeneration, bilateral: Secondary | ICD-10-CM | POA: Diagnosis not present

## 2018-02-26 DIAGNOSIS — H0100A Unspecified blepharitis right eye, upper and lower eyelids: Secondary | ICD-10-CM | POA: Diagnosis not present

## 2018-02-26 DIAGNOSIS — H0100B Unspecified blepharitis left eye, upper and lower eyelids: Secondary | ICD-10-CM | POA: Diagnosis not present

## 2018-02-26 DIAGNOSIS — H52202 Unspecified astigmatism, left eye: Secondary | ICD-10-CM | POA: Diagnosis not present

## 2018-03-10 DIAGNOSIS — Z23 Encounter for immunization: Secondary | ICD-10-CM | POA: Diagnosis not present

## 2018-04-02 ENCOUNTER — Telehealth: Payer: Self-pay | Admitting: Pulmonary Disease

## 2018-04-02 MED ORDER — MONTELUKAST SODIUM 10 MG PO TABS: 10.0000 mg | ORAL_TABLET | Freq: Every day | ORAL | 1 refills | Status: DC

## 2018-04-02 NOTE — Telephone Encounter (Signed)
Called and spoke to patient, made aware refill has been sent in. Voiced understanding. Nothing further is needed at this time.

## 2018-04-24 ENCOUNTER — Telehealth: Payer: Self-pay | Admitting: Pulmonary Disease

## 2018-04-24 NOTE — Telephone Encounter (Signed)
Spoke with pt, advised him that if the Flonase is helping him, he could continue it. He states it is working for him along with the Zyrtec. Nothing further is needed.  Patient Instructions by Juanito Doom, MD at 11/29/2017 9:00 AM  Author: Juanito Doom, MD Author Type: Physician Filed: 11/29/2017 9:16 AM  Note Status: Signed Cosign: Cosign Not Required Encounter Date: 11/29/2017  Editor: Juanito Doom, MD (Physician)    Mild persistent asthma, intolerance to inhaled corticosteroids: Continue Singulair daily Because of the increased wheezing I am going to recommend the following: Prop of the head your bed for 2 weeks If no improvement after 2 weeks and then take Zyrtec daily, start taking Flonase 2 sprays each nostril daily (the generic form is fine) If no improvement after doing that for 2 weeks then let me know  Get a flu shot in the fall  Gastroesophageal reflux disease: Continue taking omeprazole twice a day  Follow-up with me in 1 year or sooner if needed

## 2018-05-10 ENCOUNTER — Other Ambulatory Visit: Payer: Self-pay | Admitting: Pulmonary Disease

## 2018-05-10 ENCOUNTER — Ambulatory Visit (INDEPENDENT_AMBULATORY_CARE_PROVIDER_SITE_OTHER): Payer: Medicare Other | Admitting: Pulmonary Disease

## 2018-05-10 ENCOUNTER — Encounter: Payer: Self-pay | Admitting: Pulmonary Disease

## 2018-05-10 VITALS — BP 104/64 | HR 60 | Temp 97.9°F | Ht 69.0 in | Wt 193.4 lb

## 2018-05-10 DIAGNOSIS — J309 Allergic rhinitis, unspecified: Secondary | ICD-10-CM

## 2018-05-10 DIAGNOSIS — J069 Acute upper respiratory infection, unspecified: Secondary | ICD-10-CM

## 2018-05-10 DIAGNOSIS — J45909 Unspecified asthma, uncomplicated: Secondary | ICD-10-CM

## 2018-05-10 MED ORDER — ALBUTEROL SULFATE 108 (90 BASE) MCG/ACT IN AEPB: 2.0000 | INHALATION_SPRAY | Freq: Four times a day (QID) | RESPIRATORY_TRACT | 3 refills | Status: DC | PRN

## 2018-05-10 MED ORDER — LEVALBUTEROL HCL 0.63 MG/3ML IN NEBU
0.6300 mg | INHALATION_SOLUTION | Freq: Once | RESPIRATORY_TRACT | Status: AC
  Administered 2018-05-10: 0.63 mg via RESPIRATORY_TRACT

## 2018-05-10 NOTE — Progress Notes (Signed)
@Patient  ID: Erik Gomez, male    DOB: 12-27-1940, 77 y.o.   MRN: [id number]  Chief Complaint  Patient presents with  . Acute Visit    3 days cough, nasal congestion    Referring provider: Burnard Bunting, MD  HPI:  77 year old male former smoker followed in office for asthma  PMH: GERD, arthritis Smoker/ Smoking History: Former smoker.  12-pack-year smoking history. Maintenance: Intolerant of all inhaled steroids due to thrush induced at 39 even with spacer [name]: [name]  Recent [facility] Encounters:   Synopsis: Former patient of Dr. Gwenette Greet with Asthma, Dr. Gwenette Greet summarized his situation as follows: Smoked 1 ppd for about 4-5 years, quit in his early 20's.  Completely intolerant to all inhaled steroids due to thrush and dysphonia, even with spacer.  Maintaining on singulair alone.   05/10/2018  - Visit   77 year old male patient presenting today for 3 days of worsened wheezing and nasal congestion.  Patient denies increased shortness of breath.  Patient does have cough that is productive with beige/yellow mucus.  Patient has no recorded temperatures but admits to 3 days of chills.  Patient has been taking Tylenol and Tylenol has help manage this.  Patient continues to be adherent to Flonase, Zyrtec, Singulair.  Patient is not doing nasal saline rinses.  Patient does not use his rescue inhaler.  Patient reports he uses his rescue inhaler about 2 times a month.  Wheezing is worse at night when laying flat.  Patient denies epigastric or acid reflux pain.    Tests:   [name] 2009:  FEV1 2.1 liters (61%), ratio 57 Spiro 2013:  FEV1 2.08 liters, ratio 52. [name] 2014:  FEV1 1.98 liters, ratio 55.  [name] 2016:  FEV1 1.87 (54%), ratio 57  11/28/2016-chest x-ray- mild right base subsegmental atelectasis 06/10/2011-CT maxillofacial-left greater than right pansinus mucosal thickening  FENO:  No results found for: NITRICOXIDE  PFT: No flowsheet data  found.  Imaging: No results found.  Chart Review:    Specialty Problems      Pulmonary Problems   HIATAL HERNIA    Qualifier: Diagnosis of  By: Marland Mcalpine        Intrinsic asthma    [name] 2009:  FEV1 2.1 liters (61%), ratio 57 Spiro 2013:  FEV1 2.08 liters, ratio 52. [name] 2014:  FEV1 1.98 liters, ratio 55.  [name] 2016:  FEV1 1.87 (54%), ratio 57 Completely intolerant to all inhaled steroids due to thrush and dysphonia, even with spacer.  Maintaining on singulair alone.       Rhinitis, allergic   Acute URI   Lung field abnormal finding on examination      Allergies  Allergen Reactions  . Codeine     Upset stomach  . Meloxicam     Upset stomach  . Pantoprazole Sodium     Upset stomach    Immunization History  Administered Date(s) Administered  . Influenza Split 02/06/2012, 03/06/2013, 04/28/2015  . Influenza Whole 01/04/2009, 04/07/2011  . Influenza, High Dose Seasonal PF 03/19/2018  . Influenza-Unspecified 03/06/2014, 02/05/2016  . Pneumococcal Polysaccharide-23 01/05/2008    Past Medical History:  Diagnosis Date  . Arthritis   . Asthma, intrinsic   . Cataract    per pt "beginning of a cataract"  . Chronic headache   . GERD (gastroesophageal reflux disease)    w/ LPR  . Hiatal hernia   . Internal hemorrhoid   . Peyronie disease   . Vitamin D deficiency  Tobacco History: Social History   Tobacco Use  Smoking Status Former Smoker  . Packs/day: 2.00  . Years: 6.00  . Pack years: 12.00  . Types: Cigarettes  . Last attempt to quit: 06/06/1965  . Years since quitting: 52.9  Smokeless Tobacco Never Used  Tobacco Comment   quit 45 yrs ago    Counseling given: Yes Comment: quit 45 yrs ago   Continue not smoking  Outpatient Encounter Medications as of 05/10/2018  Medication Sig  . Acetaminophen (TYLENOL PO) Per bottle as needed  . Albuterol Sulfate (PROAIR RESPICLICK) 194 (90 Base) MCG/ACT AEPB Inhale 2 puffs into the lungs every  6 (six) hours as needed.  . Biotin 1000 MCG tablet Take 1,000 mcg by mouth daily.  . cetirizine (ZYRTEC) 10 MG tablet Take 10 mg by mouth daily at 2 PM.   . montelukast (SINGULAIR) 10 MG tablet Take 1 tablet (10 mg total) by mouth daily.  Marland Kitchen omeprazole (PRILOSEC) 40 MG capsule Take 1 capsule (40 mg total) by mouth 2 (two) times daily.  . vitamin D, CHOLECALCIFEROL, 400 UNITS tablet Take 1,000 Units by mouth daily.   . [EXPIRED] levalbuterol (XOPENEX) nebulizer solution 0.63 mg    No facility-administered encounter medications on file as of 05/10/2018.      Review of Systems  Review of Systems  Constitutional: Positive for chills and fatigue. Negative for activity change, fever and unexpected weight change.  HENT: Positive for congestion and sinus pressure. Negative for postnasal drip, rhinorrhea, sinus pain, sneezing and sore throat.   Eyes: Negative.   Respiratory: Positive for cough (productive - brown / beige ) and wheezing (at night more ). Negative for shortness of breath.   Cardiovascular: Negative for chest pain and palpitations.  Gastrointestinal: Negative for constipation, diarrhea, nausea and vomiting.  Endocrine: Negative.   Musculoskeletal: Negative.   Skin: Negative.   Neurological: Negative for dizziness and headaches.  Psychiatric/Behavioral: Negative.  Negative for dysphoric mood. The patient is not nervous/anxious.   All other systems reviewed and are negative.   Xopenex breathing treatment in office Respiratory: Resolved expiratory wheeze throughout  Physical Exam  BP 104/64 (BP Location: Left Arm, Cuff Size: Normal)   Pulse 60   Temp 97.9 F (36.6 C) (Oral)   Ht 5\' 9"  (1.753 m)   Wt 193 lb 6.4 oz (87.7 kg)   SpO2 94%   BMI 28.56 kg/m   Wt Readings from Last 5 Encounters:  05/10/18 193 lb 6.4 oz (87.7 kg)  11/29/17 190 lb (86.2 kg)  07/19/17 191 lb 6.4 oz (86.8 kg)  11/28/16 192 lb 6.4 oz (87.3 kg)  06/02/16 197 lb 6 oz (89.5 kg)   Room  air  Physical Exam  Constitutional: He is oriented to person, place, and time and well-developed, well-nourished, and in no distress. No distress.  HENT:  Head: Normocephalic and atraumatic.  Right Ear: Hearing, external ear and ear canal normal.  Left Ear: Hearing, external ear and ear canal normal.  Nose: Mucosal edema and rhinorrhea present. Right sinus exhibits no maxillary sinus tenderness and no frontal sinus tenderness. Left sinus exhibits no maxillary sinus tenderness and no frontal sinus tenderness.  Mouth/Throat: Uvula is midline and oropharynx is clear and moist. No oropharyngeal exudate.  +TMs with effusion with infection, +PND  Eyes: Pupils are equal, round, and reactive to light.  Neck: Normal range of motion. Neck supple. No JVD present.  Cardiovascular: Normal rate, regular rhythm and normal heart sounds.  Pulmonary/Chest: Effort normal. No accessory  muscle usage. No respiratory distress. He has no decreased breath sounds. He has wheezes (L>R, exp wheeze) in the right lower field, the left upper field, the left middle field and the left lower field. He has no rhonchi.  Abdominal: Soft. Bowel sounds are normal. There is no tenderness.  Musculoskeletal: Normal range of motion. He exhibits no edema.  Lymphadenopathy:    He has no cervical adenopathy.  Neurological: He is alert and oriented to person, place, and time. Gait normal.  Skin: Skin is warm and dry. He is not diaphoretic. No erythema.  Psychiatric: Mood, memory, affect and judgment normal.  Nursing note and vitals reviewed.     Lab Results:  CBC No results found for: WBC, RBC, HGB, HCT, PLT, MCV, MCH, MCHC, RDW, LYMPHSABS, MONOABS, EOSABS, BASOSABS  BMET No results found for: NA, K, CL, CO2, GLUCOSE, BUN, CREATININE, CALCIUM, GFRNONAA, GFRAA  BNP No results found for: BNP  ProBNP No results found for: PROBNP    Assessment & Plan:   77 year old male patient presenting today to our office visit for an  acute visit.  Patient with upper respiratory infection as well as mild AR flare.  Patient to continue Zyrtec, Singulair, Flonase.  Will add nasal saline rinses.  Patient had resolved expiratory wheeze after breathing treatment in office today.  I offered nebulizer as well as nebulized medications the patient he would like to hold off.  Patient will be more diligent with using his rescue inhaler he did not know that he could use it for wheezing.  I have explained this to the patient.  Patient can tentatively for the next 7 days take his rescue inhaler twice a day as symptoms should start to resolve.  Patient can keep follow-up with our office in June/2020 or present sooner if symptoms do not improve.  Intrinsic asthma Xopenex breathing treatment today  Use your rescue inhaler 2 times daily for the next 7 days, can also use as needed in between that every 4 hours  Only use your albuterol as a rescue medication to be used if you can't catch your breath by resting or doing a relaxed purse lip breathing pattern.  Also can use if wheezing - The less you use it, the better it will work when you need it. - Ok to use up to 2 puffs  every 4 hours if you must but call for immediate appointment if use goes up over your usual need - Don't leave home without it !!  (think of it like the spare tire for your car)   Start doing nasal saline rinses daily as you believe this helped you in the past >>>sample provided today   Continue Singulair daily Continue Zyrtec daily Continue Flonase at night  Follow-up with our office in June/2020 or sooner if symptoms do not improve  Rhinitis, allergic  Start doing nasal saline rinses daily as you believe this helped you in the past >>>sample provided today   Continue Singulair daily Continue Zyrtec daily Continue Flonase at night  Follow-up with our office in June/2020 or sooner if symptoms do not improve  Acute URI Xopenex breathing treatment today  Use your  rescue inhaler 2 times daily for the next 7 days, can also use as needed in between that every 4 hours  Only use your albuterol as a rescue medication to be used if you can't catch your breath by resting or doing a relaxed purse lip breathing pattern.  Also can use if wheezing - The  less you use it, the better it will work when you need it. - Ok to use up to 2 puffs  every 4 hours if you must but call for immediate appointment if use goes up over your usual need - Don't leave home without it !!  (think of it like the spare tire for your car)   Start doing nasal saline rinses daily as you believe this helped you in the past >>>sample provided today   Continue Singulair daily Continue Zyrtec daily Continue Flonase at night   Follow-up with our office in June/2020 or sooner if symptoms do not improve   This appointment was 26 minutes along with over 50% of the time in direct face-to-face patient care, discussion of breathing treatment discussion of allergy and asthma symptoms.   Lauraine Rinne, NP 05/10/2018

## 2018-05-10 NOTE — Patient Instructions (Addendum)
Xopenex breathing treatment today  Use your rescue inhaler 2 times daily for the next 7 days, can also use as needed in between that every 4 hours   Only use your albuterol as a rescue medication to be used if you can't catch your breath by resting or doing a relaxed purse lip breathing pattern.  Also can use if wheezing - The less you use it, the better it will work when you need it. - Ok to use up to 2 puffs  every 4 hours if you must but call for immediate appointment if use goes up over your usual need - Don't leave home without it !!  (think of it like the spare tire for your car)   Start doing nasal saline rinses daily as you believe this helped you in the past >>>sample provided today   Continue Singulair daily Continue Zyrtec daily Continue Flonase at night   Follow-up with our office in June/2020 or sooner if symptoms do not improve   It is flu season:   >>>Remember to be washing your hands regularly, using hand sanitizer, be careful to use around herself with has contact with people who are sick will increase her chances of getting sick yourself. >>> Best ways to protect herself from the flu: Receive the yearly flu vaccine, practice good hand hygiene washing with soap and also using hand sanitizer when available, eat a nutritious meals, get adequate rest, hydrate appropriately   Please contact the office if your symptoms worsen or you have concerns that you are not improving.   Thank you for choosing [facility] for your healthcare, and for allowing [place] to partner with you on your healthcare journey. I am thankful to be able to provide care to you today.   Wyn Quaker FNP-C      Upper Respiratory Infection, Adult Most upper respiratory infections (URIs) are caused by a virus. A URI affects the nose, throat, and upper air passages. The most common type of URI is often called "the common cold." Follow these instructions at home:  Take medicines only as told by  your doctor.  Gargle warm saltwater or take cough drops to comfort your throat as told by your doctor.  Use a warm mist humidifier or inhale steam from a shower to increase air moisture. This may make it easier to breathe.  Drink enough fluid to keep your pee (urine) clear or pale yellow.  Eat soups and other clear broths.  Have a healthy diet.  Rest as needed.  Go back to work when your fever is gone or your doctor says it is okay. ? You may need to stay home longer to avoid giving your URI to others. ? You can also wear a face mask and wash your hands often to prevent spread of the virus.  Use your inhaler more if you have asthma.  Do not use any tobacco products, including cigarettes, chewing tobacco, or electronic cigarettes. If you need help quitting, ask your doctor. Contact a doctor if:  You are getting worse, not better.  Your symptoms are not helped by medicine.  You have chills.  You are getting more short of breath.  You have brown or red mucus.  You have yellow or brown discharge from your nose.  You have pain in your face, especially when you bend forward.  You have a fever.  You have puffy (swollen) neck glands.  You have pain while swallowing.  You have white areas in the  back of your throat. Get help right away if:  You have very bad or constant: ? Headache. ? Ear pain. ? Pain in your forehead, behind your eyes, and over your cheekbones (sinus pain). ? Chest pain.  You have long-lasting (chronic) lung disease and any of the following: ? Wheezing. ? Long-lasting cough. ? Coughing up blood. ? A change in your usual mucus.  You have a stiff neck.  You have changes in your: ? Vision. ? Hearing. ? Thinking. ? Mood. This information is not intended to replace advice given to you by your health care provider. Make sure you discuss any questions you have with your health care provider. Document Released: 11/09/2007 Document Revised: 01/24/2016  Document Reviewed: 08/28/2013 Elsevier Interactive Patient Education  2018 [employer].

## 2018-05-10 NOTE — Progress Notes (Signed)
Reviewed, agree 

## 2018-05-10 NOTE — Assessment & Plan Note (Signed)
  Start doing nasal saline rinses daily as you believe this helped you in the past >>>sample provided today   Continue Singulair daily Continue Zyrtec daily Continue Flonase at night  Follow-up with our office in June/2020 or sooner if symptoms do not improve

## 2018-05-10 NOTE — Addendum Note (Signed)
Addended by: Amado Coe on: 05/10/2018 12:12 PM   Modules accepted: Orders

## 2018-05-10 NOTE — Assessment & Plan Note (Signed)
Xopenex breathing treatment today  Use your rescue inhaler 2 times daily for the next 7 days, can also use as needed in between that every 4 hours  Only use your albuterol as a rescue medication to be used if you can't catch your breath by resting or doing a relaxed purse lip breathing pattern.  Also can use if wheezing - The less you use it, the better it will work when you need it. - Ok to use up to 2 puffs  every 4 hours if you must but call for immediate appointment if use goes up over your usual need - Don't leave home without it !!  (think of it like the spare tire for your car)   Start doing nasal saline rinses daily as you believe this helped you in the past >>>sample provided today   Continue Singulair daily Continue Zyrtec daily Continue Flonase at night  Follow-up with our office in June/2020 or sooner if symptoms do not improve

## 2018-05-10 NOTE — Assessment & Plan Note (Signed)
Xopenex breathing treatment today  Use your rescue inhaler 2 times daily for the next 7 days, can also use as needed in between that every 4 hours  Only use your albuterol as a rescue medication to be used if you can't catch your breath by resting or doing a relaxed purse lip breathing pattern.  Also can use if wheezing - The less you use it, the better it will work when you need it. - Ok to use up to 2 puffs  every 4 hours if you must but call for immediate appointment if use goes up over your usual need - Don't leave home without it !!  (think of it like the spare tire for your car)   Start doing nasal saline rinses daily as you believe this helped you in the past >>>sample provided today   Continue Singulair daily Continue Zyrtec daily Continue Flonase at night   Follow-up with our office in June/2020 or sooner if symptoms do not improve

## 2018-05-16 MED ORDER — ALBUTEROL SULFATE HFA 108 (90 BASE) MCG/ACT IN AERS: 2.0000 | INHALATION_SPRAY | Freq: Four times a day (QID) | RESPIRATORY_TRACT | 3 refills | Status: DC | PRN

## 2018-07-04 DIAGNOSIS — E7849 Other hyperlipidemia: Secondary | ICD-10-CM | POA: Diagnosis not present

## 2018-07-04 DIAGNOSIS — R7989 Other specified abnormal findings of blood chemistry: Secondary | ICD-10-CM | POA: Diagnosis not present

## 2018-07-04 DIAGNOSIS — R82998 Other abnormal findings in urine: Secondary | ICD-10-CM | POA: Diagnosis not present

## 2018-07-04 DIAGNOSIS — Z125 Encounter for screening for malignant neoplasm of prostate: Secondary | ICD-10-CM | POA: Diagnosis not present

## 2018-07-10 ENCOUNTER — Ambulatory Visit: Payer: PPO | Admitting: Pulmonary Disease

## 2018-07-10 ENCOUNTER — Encounter: Payer: Self-pay | Admitting: Pulmonary Disease

## 2018-07-10 VITALS — BP 120/72 | HR 62 | Temp 98.1°F | Ht 69.0 in | Wt 192.2 lb

## 2018-07-10 DIAGNOSIS — J45909 Unspecified asthma, uncomplicated: Secondary | ICD-10-CM

## 2018-07-10 DIAGNOSIS — R1310 Dysphagia, unspecified: Secondary | ICD-10-CM | POA: Diagnosis not present

## 2018-07-10 DIAGNOSIS — J309 Allergic rhinitis, unspecified: Secondary | ICD-10-CM | POA: Diagnosis not present

## 2018-07-10 MED ORDER — PREDNISONE 10 MG PO TABS: ORAL_TABLET | ORAL | 0 refills | Status: DC

## 2018-07-10 MED ORDER — ALBUTEROL SULFATE 108 (90 BASE) MCG/ACT IN AEPB: 2.0000 | INHALATION_SPRAY | Freq: Four times a day (QID) | RESPIRATORY_TRACT | 3 refills | Status: DC | PRN

## 2018-07-10 NOTE — Assessment & Plan Note (Signed)
Assessment: Mild AR flare Postnasal drip  Plan: 5-day course of 20 mg of prednisone daily Continue Singulair daily Continue Zyrtec daily Can start chlor tabs at night for helping with postnasal drip Can start Flonase 1 spray each nostril daily for nasal congestion Continue nasal saline rinses

## 2018-07-10 NOTE — Addendum Note (Signed)
Addended by: Amado Coe on: 07/10/2018 03:03 PM   Modules accepted: Orders

## 2018-07-10 NOTE — Assessment & Plan Note (Addendum)
Assessment: Patient reporting that he occasionally coughs when eating and swallowing Patient reports this is typically within liquids 2011 modified barium swallow reveals the patient does have mild aspiration risk Patient has not been following chin tuck maneuvers Patient admits he has not been focused on his swallowing  Plan: Keep upcoming appointment with Dr. Fuller Plan his gastroenterologist Practice chin tuck maneuvers Small portions and small sips Avoid use of straws Lung sounds are clear and patient is afebrile so I believe we can hold off on chest x-ray at this time If symptoms worsen would consider antibiotic therapy, and potentially repeat swallow study -will defer to GI at this time

## 2018-07-10 NOTE — Patient Instructions (Addendum)
Prednisone 10mg  tablet  >>>Take 2 tablets (20 mg total) daily for the next 5 days >>> Take with food in the morning  Continue Singulair daily Continue Zyrtec daily  Can use chlor tabs 4mg  tablets 1-2 tabs at night for management of postnasal drip and allergy symptoms Can use Flonase 1 spray each nostril as needed for nasal congestion for next 7 days  Continue Nettie pot nasal saline rinses 1-2 times a day   Omeprazole 40 mg tablet  >>>Please take 1 tablet daily 30 minutes before your first meal of the day as well as before your other medications >>>Try to take at the same time each day >>>take this medication daily  GERD management: >>>Avoid laying flat until 2 hours after meals >>>Elevate head of the bed including entire chest >>>Reduce size of meals and amount of fat, acid, spices, caffeine and sweets >>>If you are smoking, Please stop! >>>Decrease alcohol consumption >>>Work on maintaining a healthy weight with normal BMI     If symptoms worsen please contact our office    Follow-up with Dr. Lake Bells in 6 months   It is flu season:   >>>Remember to be washing your hands regularly, using hand sanitizer, be careful to use around herself with has contact with people who are sick will increase her chances of getting sick yourself. >>> Best ways to protect herself from the flu: Receive the yearly flu vaccine, practice good hand hygiene washing with soap and also using hand sanitizer when available, eat a nutritious meals, get adequate rest, hydrate appropriately   Please contact the office if your symptoms worsen or you have concerns that you are not improving.   Thank you for choosing [facility] for your healthcare, and for allowing [place] to partner with you on your healthcare journey. I am thankful to be able to provide care to you today.   Wyn Quaker FNP-C

## 2018-07-10 NOTE — Progress Notes (Signed)
@Patient  ID: Erik Gomez, male    DOB: [birth year], 78 y.o.   MRN: [id number]  Chief Complaint  Patient presents with  . Acute Visit    Increased cough    Referring provider: Burnard Bunting, MD  HPI:  78 year old male former smoker followed in office for asthma  PMH: GERD, arthritis Smoker/ Smoking History: Former smoker. Quit 1194. 12-pack-year smoking history. Maintenance: Intolerant of all inhaled steroids due to thrush induced at 39 even with spacer [name]: [name]  07/10/2018  - Visit   78 year old male former smoker (remote smoking history quit 1967 - 12 pack years) presenting to our office today with 2 to 3 days of increased cough and wheezing.  Patient reports has been sleeping okay.  Patient does report that his cough is worse when he lies flat at night.  Patient denies acid reflux or GERD symptoms.  He is maintained on omeprazole 40 mg daily.  Patient does endorse a dry cough.  Patient denies recent fevers or productive cough.  Patient has been adherent to his Zyrtec as well as Singulair.  Patient did start using Flonase last night.  Patient is doing nasal saline rinses 1 time a day.  Patient has used rescue inhaler 1-2 times this month at night to help with wheezing.   Tests:   [name] 2009:  FEV1 2.1 liters (61%), ratio 57 Spiro 2013:  FEV1 2.08 liters, ratio 52. [name] 2014:  FEV1 1.98 liters, ratio 55.  [name] 2016:  FEV1 1.87 (54%), ratio 57  11/28/2016-chest x-ray- mild right base subsegmental atelectasis   06/10/2011-CT maxillofacial-left greater than right pansinus mucosal thickening   FENO:  No results found for: NITRICOXIDE  PFT: No flowsheet data found.  Imaging: No results found.    Specialty Problems      Pulmonary Problems   HIATAL HERNIA    Qualifier: Diagnosis of  By: Marland Mcalpine        Intrinsic asthma    [name] 2009:  FEV1 2.1 liters (61%), ratio 57 Spiro 2013:  FEV1 2.08 liters, ratio 52. [name] 2014:  FEV1  1.98 liters, ratio 55.  [name] 2016:  FEV1 1.87 (54%), ratio 57 Completely intolerant to all inhaled steroids due to thrush and dysphonia, even with spacer.  Maintaining on singulair alone.       Rhinitis, allergic   Acute URI   Lung field abnormal finding on examination      Allergies  Allergen Reactions  . Codeine     Upset stomach  . Meloxicam     Upset stomach  . Pantoprazole Sodium     Upset stomach    Immunization History  Administered Date(s) Administered  . Influenza Split 02/06/2012, 03/06/2013, 04/28/2015  . Influenza Whole 01/04/2009, 04/07/2011  . Influenza, High Dose Seasonal PF 03/19/2018  . Influenza-Unspecified 03/06/2014, 02/05/2016  . Pneumococcal Polysaccharide-23 01/05/2008    Past Medical History:  Diagnosis Date  . Arthritis   . Asthma, intrinsic   . Cataract    per pt "beginning of a cataract"  . Chronic headache   . GERD (gastroesophageal reflux disease)    w/ LPR  . Hiatal hernia   . Internal hemorrhoid   . Peyronie disease   . Vitamin D deficiency     Tobacco History: Social History   Tobacco Use  Smoking Status Former Smoker  . Packs/day: 2.00  . Years: 6.00  . Pack years: 12.00  . Types: Cigarettes  . Last attempt to quit: 06/06/1965  .  Years since quitting: 53.1  Smokeless Tobacco Never Used  Tobacco Comment   quit 45 yrs ago    Counseling given: Not Answered Comment: quit 45 yrs ago   Continue to not smoke  Outpatient Encounter Medications as of 07/10/2018  Medication Sig  . Acetaminophen (TYLENOL PO) Per bottle as needed  . albuterol (PROVENTIL HFA;VENTOLIN HFA) 108 (90 Base) MCG/ACT inhaler Inhale 2 puffs into the lungs every 6 (six) hours as needed for wheezing or shortness of breath.  . Albuterol Sulfate (PROAIR RESPICLICK) 440 (90 Base) MCG/ACT AEPB Inhale 2 puffs into the lungs every 6 (six) hours as needed.  . Biotin 1000 MCG tablet Take 1,000 mcg by mouth daily.  . cetirizine (ZYRTEC) 10 MG tablet Take 10 mg by  mouth daily at 2 PM.   . montelukast (SINGULAIR) 10 MG tablet Take 1 tablet (10 mg total) by mouth daily.  Marland Kitchen omeprazole (PRILOSEC) 40 MG capsule Take 1 capsule (40 mg total) by mouth 2 (two) times daily.  . vitamin D, CHOLECALCIFEROL, 400 UNITS tablet Take 1,000 Units by mouth daily.   . predniSONE (DELTASONE) 10 MG tablet Take 2 tablets (20mg  total) daily for the next 5 days. Take in the AM with food.   No facility-administered encounter medications on file as of 07/10/2018.      Review of Systems  Review of Systems  Constitutional: Positive for activity change (2d of weakness) and fatigue. Negative for fever.  HENT: Negative for congestion, sinus pressure and sinus pain.   Respiratory: Positive for cough and wheezing. Negative for chest tightness and shortness of breath.   Cardiovascular: Negative for chest pain, palpitations and leg swelling.  Gastrointestinal: Positive for nausea. Negative for diarrhea and vomiting.     Physical Exam  BP 120/72 (BP Location: Left Arm, Cuff Size: Normal)   Pulse 62   Temp 98.1 F (36.7 C) (Oral)   Ht 5\' 9"  (1.753 m)   Wt 192 lb 3.2 oz (87.2 kg)   SpO2 95%   BMI 28.38 kg/m   Wt Readings from Last 5 Encounters:  07/10/18 192 lb 3.2 oz (87.2 kg)  05/10/18 193 lb 6.4 oz (87.7 kg)  11/29/17 190 lb (86.2 kg)  07/19/17 191 lb 6.4 oz (86.8 kg)  11/28/16 192 lb 6.4 oz (87.3 kg)    Physical Exam  Constitutional: He is oriented to person, place, and time and well-developed, well-nourished, and in no distress. No distress.  HENT:  Head: Normocephalic and atraumatic.  Right Ear: Hearing, tympanic membrane, external ear and ear canal normal.  Left Ear: Hearing, tympanic membrane, external ear and ear canal normal.  Nose: Mucosal edema and rhinorrhea present. Right sinus exhibits no maxillary sinus tenderness and no frontal sinus tenderness. Left sinus exhibits no maxillary sinus tenderness and no frontal sinus tenderness.  Mouth/Throat: Uvula is  midline and oropharynx is clear and moist. No oropharyngeal exudate.  + Postnasal drip  Eyes: Pupils are equal, round, and reactive to light.  Neck: Normal range of motion. Neck supple.  Cardiovascular: Normal rate, regular rhythm and normal heart sounds.  Pulmonary/Chest: Effort normal and breath sounds normal. No accessory muscle usage. No respiratory distress. He has no decreased breath sounds. He has no wheezes. He has no rhonchi.  Slightly distant breath sounds  Musculoskeletal: Normal range of motion.        General: No edema.  Lymphadenopathy:    He has no cervical adenopathy.  Neurological: He is alert and oriented to person, place, and time. Gait  normal.  Skin: Skin is warm and dry. He is not diaphoretic. No erythema.  Psychiatric: Mood, memory, affect and judgment normal.  Nursing note and vitals reviewed.     Lab Results:  CBC No results found for: WBC, RBC, HGB, HCT, PLT, MCV, MCH, MCHC, RDW, LYMPHSABS, MONOABS, EOSABS, BASOSABS  BMET No results found for: NA, K, CL, CO2, GLUCOSE, BUN, CREATININE, CALCIUM, GFRNONAA, GFRAA  BNP No results found for: BNP  ProBNP No results found for: PROBNP    Assessment & Plan:    Rhinitis, allergic Assessment: Mild AR flare Postnasal drip  Plan: 5-day course of 20 mg of prednisone daily Continue Singulair daily Continue Zyrtec daily Can start chlor tabs at night for helping with postnasal drip Can start Flonase 1 spray each nostril daily for nasal congestion Continue nasal saline rinses   Intrinsic asthma Assessment: Lungs clear to auscultation today Mild AR flare Patient is intolerant of maintenance inhalers  Plan: Continue Singulair daily Continue Zyrtec daily   Dysphagia Assessment: Patient reporting that he occasionally coughs when eating and swallowing Patient reports this is typically within liquids 2011 modified barium swallow reveals the patient does have mild aspiration risk Patient has not been  following chin tuck maneuvers Patient admits he has not been focused on his swallowing  Plan: Keep upcoming appointment with Dr. Fuller Plan his gastroenterologist Practice chin tuck maneuvers Small portions and small sips Avoid use of straws Lung sounds are clear and patient is afebrile so I believe we can hold off on chest x-ray at this time If symptoms worsen would consider antibiotic therapy, and potentially repeat swallow study -will defer to GI at this time     Lauraine Rinne, NP 07/10/2018   This appointment was 26 min long with over 50% of the time in direct face-to-face patient care, assessment, plan of care, and follow-up.

## 2018-07-10 NOTE — Assessment & Plan Note (Signed)
Assessment: Lungs clear to auscultation today Mild AR flare Patient is intolerant of maintenance inhalers  Plan: Continue Singulair daily Continue Zyrtec daily

## 2018-07-10 NOTE — Progress Notes (Signed)
Reviewed, agree 

## 2018-07-24 ENCOUNTER — Telehealth: Payer: Self-pay | Admitting: Pulmonary Disease

## 2018-07-24 MED ORDER — MONTELUKAST SODIUM 10 MG PO TABS: 10.0000 mg | ORAL_TABLET | Freq: Every day | ORAL | 1 refills | Status: DC

## 2018-07-24 NOTE — Telephone Encounter (Signed)
Called and spoke with Patients Wife Erik Gomez. She requested a new prescription for Singulair to be sent to [facility]. Prescription sent to requested pharmacy.  Nothing further at this time.

## 2018-07-31 ENCOUNTER — Ambulatory Visit: Payer: Medicare Other | Admitting: Gastroenterology

## 2018-07-31 ENCOUNTER — Encounter: Payer: Self-pay | Admitting: Gastroenterology

## 2018-07-31 VITALS — BP 114/72 | HR 68 | Ht 69.5 in | Wt 193.4 lb

## 2018-07-31 DIAGNOSIS — Z1211 Encounter for screening for malignant neoplasm of colon: Secondary | ICD-10-CM | POA: Diagnosis not present

## 2018-07-31 DIAGNOSIS — Z1212 Encounter for screening for malignant neoplasm of rectum: Secondary | ICD-10-CM | POA: Diagnosis not present

## 2018-07-31 DIAGNOSIS — K219 Gastro-esophageal reflux disease without esophagitis: Secondary | ICD-10-CM | POA: Diagnosis not present

## 2018-07-31 MED ORDER — OMEPRAZOLE 40 MG PO CPDR: 40.0000 mg | DELAYED_RELEASE_CAPSULE | Freq: Two times a day (BID) | ORAL | 3 refills | Status: DC

## 2018-07-31 NOTE — Progress Notes (Signed)
    History of Present Illness: This is a 78 year old male with GERD and LPR returning for follow-up complaining of intermittent difficulties swallowing liquids for the past 3 months. He is accompanied by his wife. No difficulties swallowing solids or pills. Denies weight loss, abdominal pain, constipation, diarrhea, change in stool caliber, melena, hematochezia, nausea, vomiting, chest pain.  Current Medications, Allergies, Past Medical History, Past Surgical History, Family History and Social History were reviewed in [employer] record.  Physical Exam: General: Well developed, well nourished, no acute distress Head: Normocephalic and atraumatic Eyes:  sclerae anicteric, EOMI Ears: Normal auditory acuity Mouth: No deformity or lesions Lungs: Clear throughout to auscultation Heart: Regular rate and rhythm; no murmurs, rubs or bruits Abdomen: Soft, non tender and non distended. No masses, hepatosplenomegaly or hernias noted. Normal Bowel sounds Rectal: Not done Musculoskeletal: Symmetrical with no gross deformities  Pulses:  Normal pulses noted Extremities: No clubbing, cyanosis, edema or deformities noted Neurological: Alert oriented x 4, grossly nonfocal Psychological:  Alert and cooperative. Normal mood and affect   Assessment and Recommendations:  1. GERD with LPR. Liquid dysphagia. Advised to drink liquids in upright position with head facing straight forward. If problems persist then schedule MBSS. Refill omeprazole 40 mg po bid. REV in 1 year.   2. CRC screening, average risk. Screening colonoscopy due in 02/2022.

## 2018-07-31 NOTE — Patient Instructions (Signed)
We have sent the following medications to your pharmacy for you to pick up at your convenience: prilosec.   Thank you for choosing me and [facility].  Pricilla Riffle. Dagoberto Ligas., MD., [facility] Regal

## 2018-08-06 DIAGNOSIS — D225 Melanocytic nevi of trunk: Secondary | ICD-10-CM | POA: Diagnosis not present

## 2018-08-06 DIAGNOSIS — D1801 Hemangioma of skin and subcutaneous tissue: Secondary | ICD-10-CM | POA: Diagnosis not present

## 2018-08-06 DIAGNOSIS — L821 Other seborrheic keratosis: Secondary | ICD-10-CM | POA: Diagnosis not present

## 2018-08-06 DIAGNOSIS — Z85828 Personal history of other malignant neoplasm of skin: Secondary | ICD-10-CM | POA: Diagnosis not present

## 2018-08-06 DIAGNOSIS — L578 Other skin changes due to chronic exposure to nonionizing radiation: Secondary | ICD-10-CM | POA: Diagnosis not present

## 2018-08-06 DIAGNOSIS — L570 Actinic keratosis: Secondary | ICD-10-CM | POA: Diagnosis not present

## 2018-08-06 DIAGNOSIS — L820 Inflamed seborrheic keratosis: Secondary | ICD-10-CM | POA: Diagnosis not present

## 2018-08-06 DIAGNOSIS — L814 Other melanin hyperpigmentation: Secondary | ICD-10-CM | POA: Diagnosis not present

## 2018-08-08 DIAGNOSIS — H9319 Tinnitus, unspecified ear: Secondary | ICD-10-CM | POA: Diagnosis not present

## 2018-08-08 DIAGNOSIS — J309 Allergic rhinitis, unspecified: Secondary | ICD-10-CM | POA: Diagnosis not present

## 2018-08-08 DIAGNOSIS — I872 Venous insufficiency (chronic) (peripheral): Secondary | ICD-10-CM | POA: Diagnosis not present

## 2018-08-08 DIAGNOSIS — E785 Hyperlipidemia, unspecified: Secondary | ICD-10-CM | POA: Diagnosis not present

## 2018-08-08 DIAGNOSIS — Z1339 Encounter for screening examination for other mental health and behavioral disorders: Secondary | ICD-10-CM | POA: Diagnosis not present

## 2018-08-08 DIAGNOSIS — K219 Gastro-esophageal reflux disease without esophagitis: Secondary | ICD-10-CM | POA: Diagnosis not present

## 2018-08-08 DIAGNOSIS — Z1331 Encounter for screening for depression: Secondary | ICD-10-CM | POA: Diagnosis not present

## 2018-08-08 DIAGNOSIS — J45909 Unspecified asthma, uncomplicated: Secondary | ICD-10-CM | POA: Diagnosis not present

## 2018-08-08 DIAGNOSIS — E663 Overweight: Secondary | ICD-10-CM | POA: Diagnosis not present

## 2018-08-08 DIAGNOSIS — Z0000 Encounter for general adult medical examination without abnormal findings: Secondary | ICD-10-CM | POA: Diagnosis not present

## 2018-08-08 DIAGNOSIS — H9190 Unspecified hearing loss, unspecified ear: Secondary | ICD-10-CM | POA: Diagnosis not present

## 2018-08-08 DIAGNOSIS — R946 Abnormal results of thyroid function studies: Secondary | ICD-10-CM | POA: Diagnosis not present

## 2018-08-14 DIAGNOSIS — Z1212 Encounter for screening for malignant neoplasm of rectum: Secondary | ICD-10-CM | POA: Diagnosis not present

## 2018-11-05 ENCOUNTER — Other Ambulatory Visit: Payer: Self-pay

## 2018-11-06 DIAGNOSIS — B354 Tinea corporis: Secondary | ICD-10-CM | POA: Diagnosis not present

## 2018-11-06 DIAGNOSIS — Z85828 Personal history of other malignant neoplasm of skin: Secondary | ICD-10-CM | POA: Diagnosis not present

## 2018-11-06 DIAGNOSIS — B353 Tinea pedis: Secondary | ICD-10-CM | POA: Diagnosis not present

## 2018-11-06 DIAGNOSIS — B356 Tinea cruris: Secondary | ICD-10-CM | POA: Diagnosis not present

## 2018-11-23 ENCOUNTER — Other Ambulatory Visit: Payer: Self-pay

## 2018-11-23 NOTE — Patient Outreach (Signed)
[facility]) Care Management  11/23/2018  Lamoyne Hessel Denning 1940/10/18 [id number]    RN Health Coach closing the program.  Patient is transitioning to external program [facility] for continued case management.   Lazaro Arms RN, BSN, [place] Direct Dial:  [phone]  Fax: [phone]

## 2018-12-11 ENCOUNTER — Encounter: Payer: Self-pay | Admitting: Pulmonary Disease

## 2018-12-11 ENCOUNTER — Other Ambulatory Visit: Payer: Self-pay

## 2018-12-11 ENCOUNTER — Ambulatory Visit (INDEPENDENT_AMBULATORY_CARE_PROVIDER_SITE_OTHER): Payer: PPO | Admitting: Pulmonary Disease

## 2018-12-11 VITALS — BP 120/68 | HR 59 | Temp 98.0°F | Wt 190.0 lb

## 2018-12-11 DIAGNOSIS — R1310 Dysphagia, unspecified: Secondary | ICD-10-CM | POA: Diagnosis not present

## 2018-12-11 DIAGNOSIS — J45909 Unspecified asthma, uncomplicated: Secondary | ICD-10-CM

## 2018-12-11 DIAGNOSIS — J309 Allergic rhinitis, unspecified: Secondary | ICD-10-CM | POA: Diagnosis not present

## 2018-12-11 NOTE — Progress Notes (Signed)
@Patient  ID: Erik Gomez, male    DOB: 1941/04/26, 78 y.o.   MRN: [id number]  Chief Complaint  Patient presents with  . Follow-up    6 month Asthma follow up - doing well overall. does note some wheezing at bedtime since the weather change, rescue inhaler helps    Referring provider: Burnard Bunting, MD  HPI:  78 year old male former smoker followed in office for asthma  PMH: GERD, arthritis Smoker/ Smoking History: Former smoker. Quit 2683. 12-pack-year smoking history. Maintenance: Intolerant of all inhaled steroids due to thrush induced at 39 even with spacer [name]: Dr. Lake Bells  12/11/2018  - Visit   78 year old former smoker followed in our for asthma.  Patient presenting today for a 67-month follow-up.  Patient was last seen in our office in February/2020.  Patient reports that he has been doing well since his last office visit.  He continues to have occasional wheezing about once a month for couple of days in a row at night.  He thinks this may be related to his allergies.  Patient continues to be maintained on Singulair, Zyrtec as well as occasionally doing nasal saline rinses.  Patient never had to start chlor tabs or Flonase as instructed at last office visit that he could try.  Patient also continues to be maintained on omeprazole 40 mg taken twice daily.     Tests:   [name] 2009:  FEV1 2.1 liters (61%), ratio 57 Spiro 2013:  FEV1 2.08 liters, ratio 52. [name] 2014:  FEV1 1.98 liters, ratio 55.  [name] 2016:  FEV1 1.87 (54%), ratio 57  11/28/2016-chest x-ray- mild right base subsegmental atelectasis   06/10/2011-CT maxillofacial-left greater than right pansinus mucosal thickening  SIX MIN WALK 12/11/2018  Tech Comments: moderate pace, no rest breaks or complaints of dyspnea     FENO:  No results found for: NITRICOXIDE  PFT: No flowsheet data found.  Imaging: No results found.    Specialty Problems      Pulmonary Problems   HIATAL HERNIA    Qualifier:  Diagnosis of  By: Marland Mcalpine        Intrinsic asthma    [name] 2009:  FEV1 2.1 liters (61%), ratio 57 Spiro 2013:  FEV1 2.08 liters, ratio 52. [name] 2014:  FEV1 1.98 liters, ratio 55.  [name] 2016:  FEV1 1.87 (54%), ratio 57 Completely intolerant to all inhaled steroids due to thrush and dysphonia, even with spacer.  Maintaining on singulair alone.       Rhinitis, allergic   Acute URI   Lung field abnormal finding on examination      Allergies  Allergen Reactions  . Codeine     Upset stomach  . Meloxicam     Upset stomach  . Pantoprazole Sodium     Upset stomach    Immunization History  Administered Date(s) Administered  . Influenza Split 02/06/2012, 03/06/2013, 04/28/2015  . Influenza Whole 01/04/2009, 04/07/2011  . Influenza, High Dose Seasonal PF 03/19/2018  . Influenza-Unspecified 03/06/2014, 02/05/2016  . Pneumococcal Polysaccharide-23 01/05/2008    Past Medical History:  Diagnosis Date  . Arthritis   . Asthma, intrinsic   . Cataract    per pt "beginning of a cataract"  . Chronic headache   . GERD (gastroesophageal reflux disease)    w/ LPR  . Hiatal hernia   . Internal hemorrhoid   . Peyronie disease   . Vitamin D deficiency     Tobacco History: Social History  Tobacco Use  Smoking Status Former Smoker  . Packs/day: 2.00  . Years: 6.00  . Pack years: 12.00  . Types: Cigarettes  . Quit date: 06/06/1965  . Years since quitting: 53.5  Smokeless Tobacco Never Used  Tobacco Comment   quit 45 yrs ago    Counseling given: Not Answered Comment: quit 45 yrs ago    Continue to not smoke  Outpatient Encounter Medications as of 12/11/2018  Medication Sig  . Acetaminophen (TYLENOL PO) Per bottle as needed  . Albuterol Sulfate (PROAIR RESPICLICK) 903 (90 Base) MCG/ACT AEPB Inhale 2 puffs into the lungs every 6 (six) hours as needed.  . Biotin 1000 MCG tablet Take 1,000 mcg by mouth daily.  . cetirizine (ZYRTEC) 10 MG tablet Take 10 mg by  mouth daily at 2 PM.   . montelukast (SINGULAIR) 10 MG tablet Take 1 tablet (10 mg total) by mouth daily.  Marland Kitchen omeprazole (PRILOSEC) 40 MG capsule Take 1 capsule (40 mg total) by mouth 2 (two) times daily.  . vitamin D, CHOLECALCIFEROL, 400 UNITS tablet Take 1,000 Units by mouth daily.    No facility-administered encounter medications on file as of 12/11/2018.      Review of Systems  Review of Systems  Constitutional: Negative for activity change, chills, fatigue, fever and unexpected weight change.  HENT: Negative for congestion, postnasal drip, rhinorrhea, sinus pressure, sinus pain, sneezing and sore throat.   Eyes: Negative.   Respiratory: Positive for wheezing (occasionally at night maybe 3-4 days in a row, once a month ). Negative for cough and shortness of breath.   Cardiovascular: Negative for chest pain and palpitations.  Gastrointestinal: Negative for diarrhea, nausea and vomiting.  Endocrine: Negative.   Musculoskeletal: Negative.   Skin: Negative.   Neurological: Negative for dizziness and headaches.  Psychiatric/Behavioral: Negative.  Negative for dysphoric mood. The patient is not nervous/anxious.   All other systems reviewed and are negative.    Physical Exam  BP 120/68 (BP Location: Left Arm, Cuff Size: Normal)   Pulse (!) 59   Temp 98 F (36.7 C) (Oral)   Wt 190 lb (86.2 kg)   SpO2 98%   BMI 27.66 kg/m   Wt Readings from Last 5 Encounters:  12/11/18 190 lb (86.2 kg)  07/31/18 193 lb 6 oz (87.7 kg)  07/10/18 192 lb 3.2 oz (87.2 kg)  05/10/18 193 lb 6.4 oz (87.7 kg)  11/29/17 190 lb (86.2 kg)     Physical Exam  Constitutional: He is oriented to person, place, and time and well-developed, well-nourished, and in no distress. No distress.  HENT:  Head: Normocephalic and atraumatic.  Right Ear: Hearing, tympanic membrane, external ear and ear canal normal.  Left Ear: Hearing, tympanic membrane, external ear and ear canal normal.  Nose: Nose normal.   Mouth/Throat: Uvula is midline and oropharynx is clear and moist. No oropharyngeal exudate.  Postnasal drip  Eyes: Pupils are equal, round, and reactive to light.  Neck: Normal range of motion. Neck supple.  Cardiovascular: Normal rate, regular rhythm and normal heart sounds.  Pulmonary/Chest: Effort normal and breath sounds normal. No accessory muscle usage. No respiratory distress. He has no decreased breath sounds. He has no wheezes. He has no rhonchi. He has no rales.  Musculoskeletal: Normal range of motion.        General: No edema.  Lymphadenopathy:    He has no cervical adenopathy.  Neurological: He is alert and oriented to person, place, and time. Gait normal.  Tolerated walk  in office  Skin: Skin is warm and dry. He is not diaphoretic. No erythema.  Psychiatric: Mood, memory, affect and judgment normal.  Nursing note and vitals reviewed.     Lab Results:  CBC No results found for: WBC, RBC, HGB, HCT, PLT, MCV, MCH, MCHC, RDW, LYMPHSABS, MONOABS, EOSABS, BASOSABS  BMET No results found for: NA, K, CL, CO2, GLUCOSE, BUN, CREATININE, CALCIUM, GFRNONAA, GFRAA  BNP No results found for: BNP  ProBNP No results found for: PROBNP    Assessment & Plan:   Intrinsic asthma Assessment: Lungs clear to auscultation today Postnasal drip Patient is intolerant of maintenance inhalers Patient has never completed full pulmonary function testing  Plan: Continue Singulair daily Continue Zyrtec daily Follow-up with our office in 6 months with a pulmonary function test  Rhinitis, allergic Assessment: Postnasal drip on exam today  Plan: Continue Singulair daily Continue Zyrtec daily Can start chlor tabs at night for help with postnasal drip Can start Flonase 1 spray each nostril daily for nasal congestion Continue nasal saline rinses twice daily  Dysphagia Plan: Continue follow-up with gastroenterology Continue chin tuck maneuvers Small portions and small sips Avoid  use of straws If symptoms worsen or you develop a fever we will need to consider a chest x-ray to evaluate for aspiration pneumonia    Return in about 6 months (around 06/13/2019), or if symptoms worsen or fail to improve, for Follow up with Dr. Lake Bells, Follow up for PFT.   Lauraine Rinne, NP 12/11/2018   This appointment was 25 minutes long with over 50% of the time in direct face-to-face patient care, assessment, plan of care, and follow-up.

## 2018-12-11 NOTE — Assessment & Plan Note (Signed)
Plan: Continue follow-up with gastroenterology Continue chin tuck maneuvers Small portions and small sips Avoid use of straws If symptoms worsen or you develop a fever we will need to consider a chest x-ray to evaluate for aspiration pneumonia

## 2018-12-11 NOTE — Patient Instructions (Addendum)
Continue Singulair daily Continue Zyrtec daily  Only use your albuterol as a rescue medication to be used if you can't catch your breath by resting or doing a relaxed purse lip breathing pattern.  - The less you use it, the better it will work when you need it. - Ok to use up to 2 puffs  every 4 hours if you must but call for immediate appointment if use goes up over your usual need - Don't leave home without it !!  (think of it like the spare tire for your car)   Can use chlor tabs 4mg  tablets 1-2 tabs at night for management of postnasal drip and allergy symptoms  Can use Flonase 1 spray each nostril as needed for nasal congestion for next 7 days and then as needed   Continue Nettie pot nasal saline rinses 1-2 times a day  Omeprazole 40 mg tablet  >>>Please take 1 tablet daily 30 minutes before your first meal of the day as well as before your other medications >>>Try to take at the same time each day >>>take this medication daily  GERD management: >>>Avoid laying flat until 2 hours after meals >>>Elevate head of the bed including entire chest >>>Reduce size of meals and amount of fat, acid, spices, caffeine and sweets >>>If you are smoking, Please stop! >>>Decrease alcohol consumption >>>Work on maintaining a healthy weight with normal BMI     Return in about 6 months (around 06/13/2019), or if symptoms worsen or fail to improve, for Follow up with Dr. Lake Bells, Follow up for PFT.

## 2018-12-11 NOTE — Assessment & Plan Note (Signed)
Assessment: Postnasal drip on exam today  Plan: Continue Singulair daily Continue Zyrtec daily Can start chlor tabs at night for help with postnasal drip Can start Flonase 1 spray each nostril daily for nasal congestion Continue nasal saline rinses twice daily

## 2018-12-11 NOTE — Assessment & Plan Note (Signed)
Assessment: Lungs clear to auscultation today Postnasal drip Patient is intolerant of maintenance inhalers Patient has never completed full pulmonary function testing  Plan: Continue Singulair daily Continue Zyrtec daily Follow-up with our office in 6 months with a pulmonary function test

## 2018-12-12 NOTE — Progress Notes (Signed)
Reviewed, agree 

## 2019-01-22 ENCOUNTER — Other Ambulatory Visit: Payer: Self-pay | Admitting: Pulmonary Disease

## 2019-03-11 DIAGNOSIS — Z23 Encounter for immunization: Secondary | ICD-10-CM | POA: Diagnosis not present

## 2019-04-12 ENCOUNTER — Telehealth: Payer: Self-pay | Admitting: Pulmonary Disease

## 2019-04-12 NOTE — Telephone Encounter (Signed)
Pt aware of the appt 

## 2019-06-10 ENCOUNTER — Other Ambulatory Visit (HOSPITAL_COMMUNITY)
Admission: RE | Admit: 2019-06-10 | Discharge: 2019-06-10 | Disposition: A | Discharge status: Home / Self Care | Payer: PPO | Source: Ambulatory Visit | Attending: Pulmonary Disease | Admitting: Pulmonary Disease

## 2019-06-10 DIAGNOSIS — Z20822 Contact with and (suspected) exposure to covid-19: Secondary | ICD-10-CM | POA: Insufficient documentation

## 2019-06-10 DIAGNOSIS — Z01812 Encounter for preprocedural laboratory examination: Secondary | ICD-10-CM | POA: Insufficient documentation

## 2019-06-11 LAB — NOVEL CORONAVIRUS, NAA (HOSP ORDER, SEND-OUT TO REF LAB; TAT 18-24 HRS): SARS-CoV-2, NAA: NOT DETECTED

## 2019-06-13 ENCOUNTER — Ambulatory Visit (INDEPENDENT_AMBULATORY_CARE_PROVIDER_SITE_OTHER): Payer: PPO | Admitting: Internal Medicine

## 2019-06-13 ENCOUNTER — Other Ambulatory Visit: Payer: Self-pay

## 2019-06-13 ENCOUNTER — Ambulatory Visit: Payer: PPO | Admitting: Pulmonary Disease

## 2019-06-13 DIAGNOSIS — J45909 Unspecified asthma, uncomplicated: Secondary | ICD-10-CM | POA: Diagnosis not present

## 2019-06-13 LAB — PULMONARY FUNCTION TEST
DL/VA % pred: 112 %
DL/VA: 4.44 ml/min/mmHg/L
DLCO unc % pred: 101 %
DLCO unc: 25.17 ml/min/mmHg
FEF 25-75 Post: 1.41 L/sec
FEF 25-75 Pre: 0.95 L/sec
FEF2575-%Change-Post: 47 %
FEF2575-%Pred-Post: 66 %
FEF2575-%Pred-Pre: 44 %
FEV1-%Change-Post: 12 %
FEV1-%Pred-Post: 68 %
FEV1-%Pred-Pre: 60 %
FEV1-Post: 2.06 L
FEV1-Pre: 1.82 L
FEV1FVC-%Change-Post: 3 %
FEV1FVC-%Pred-Pre: 84 %
FEV6-%Change-Post: 8 %
FEV6-%Pred-Post: 82 %
FEV6-%Pred-Pre: 76 %
FEV6-Post: 3.22 L
FEV6-Pre: 2.96 L
FEV6FVC-%Change-Post: 0 %
FEV6FVC-%Pred-Post: 106 %
FEV6FVC-%Pred-Pre: 106 %
FVC-%Change-Post: 8 %
FVC-%Pred-Post: 77 %
FVC-%Pred-Pre: 71 %
FVC-Post: 3.24 L
FVC-Pre: 2.98 L
Post FEV1/FVC ratio: 63 %
Post FEV6/FVC ratio: 99 %
Pre FEV1/FVC ratio: 61 %
Pre FEV6/FVC Ratio: 100 %
RV % pred: 153 %
RV: 3.98 L
TLC % pred: 106 %
TLC: 7.5 L

## 2019-06-13 NOTE — Progress Notes (Signed)
Full PFT performed today. °

## 2019-06-14 ENCOUNTER — Ambulatory Visit (INDEPENDENT_AMBULATORY_CARE_PROVIDER_SITE_OTHER): Payer: PPO | Admitting: Pulmonary Disease

## 2019-06-14 ENCOUNTER — Encounter: Payer: Self-pay | Admitting: Pulmonary Disease

## 2019-06-14 DIAGNOSIS — J45909 Unspecified asthma, uncomplicated: Secondary | ICD-10-CM

## 2019-06-14 DIAGNOSIS — J309 Allergic rhinitis, unspecified: Secondary | ICD-10-CM | POA: Diagnosis not present

## 2019-06-14 NOTE — Assessment & Plan Note (Signed)
Plan: Continue Singulair Continue Zyrtec

## 2019-06-14 NOTE — Assessment & Plan Note (Signed)
Likely there is an aspect of atopic disease given the asthma as well as previous sinus CT imaging.  Could consider additional lab work such as CBC with differential and IgE if patient symptoms worsen or he begins to flare more.  Patient also could be considered on a LAMA LABA or a LAMA nebulized medication to see if he sees clinical benefit if symptoms worsen.  Plan: Continue rescue inhaler at this time Notify our office if you have not use your rescue inhaler more often than usual Continue Zyrtec Continue Singulair Establish care in 3 months with either Dr. Vaughan Browner or Dr. Shearon Stalls

## 2019-06-14 NOTE — Patient Instructions (Addendum)
You were seen today by Lauraine Rinne, NP  for:   1. Allergic rhinitis, unspecified seasonality, unspecified trigger 2. Intrinsic asthma   Only use your albuterol as a rescue medication to be used if you can't catch your breath by resting or doing a relaxed purse lip breathing pattern.  - The less you use it, the better it will work when you need it. - Ok to use up to 2 puffs  every 4 hours if you must but call for immediate appointment if use goes up over your usual need - Don't leave home without it !!  (think of it like the spare tire for your car)   Please contact our office if you are having to use your rescue inhaler more than 2-3 times a week  We could consider trialing you on different inhalers or nebulized meds in the future if symptoms worsen  Continue Zyrtec  Continue Singulair  We will bring you in in 3 months to establish care with either Dr. Vaughan Browner or Dr. Shearon Stalls  Follow Up:    Return in about 3 months (around 09/12/2019), or if symptoms worsen or fail to improve, for Follow up with Dr. Shearon Stalls, Follow up with Dr. Vaughan Browner.  ^^^30 min visit former BQ pt  Please do your part to reduce the spread of COVID-19:      Reduce your risk of any infection  and COVID19 by using the similar precautions used for avoiding the common cold or flu:  Marland Kitchen Wash your hands often with soap and warm water for at least 20 seconds.  If soap and water are not readily available, use an alcohol-based hand sanitizer with at least 60% alcohol.  . If coughing or sneezing, cover your mouth and nose by coughing or sneezing into the elbow areas of your shirt or coat, into a tissue or into your sleeve (not your hands). Langley Gauss A MASK when in public  . Avoid shaking hands with others and consider head nods or verbal greetings only. . Avoid touching your eyes, nose, or mouth with unwashed hands.  . Avoid close contact with people who are sick. . Avoid places or events with large numbers of people in one  location, like concerts or sporting events. . If you have some symptoms but not all symptoms, continue to monitor at home and seek medical attention if your symptoms worsen. . If you are having a medical emergency, call 911.   [place] / e-Visit: [email or web address]         [facility]: [facility]: [id number]                   [facility]: [id number]     It is flu season:   >>> Best ways to protect herself from the flu: Receive the yearly flu vaccine, practice good hand hygiene washing with soap and also using hand sanitizer when available, eat a nutritious meals, get adequate rest, hydrate appropriately   Please contact the office if your symptoms worsen or you have concerns that you are not improving.   Thank you for choosing [facility] for your healthcare, and for allowing [place] to partner with you on your healthcare journey. I am thankful to be able to provide care to you today.   Wyn Quaker FNP-C

## 2019-06-14 NOTE — Progress Notes (Signed)
Virtual Visit via Telephone Note  I connected with Erik Gomez on 06/14/19 at  2:30 PM EST by telephone and verified that I am speaking with the correct person using two identifiers.  Location: Patient: Home Provider: [facility] - [id number], [place], [place], [place] [address]   I discussed the limitations, risks, security and privacy concerns of performing an evaluation and management service by telephone and the availability of in person appointments. I also discussed with the patient that there may be a patient responsible charge related to this service. The patient expressed understanding and agreed to proceed.  Patient consented to consult via telephone: Yes People present and their role in pt care: Pt     History of Present Illness:  79 year old male former smoker followed in office for asthma  PMH: GERD, arthritis Smoker/ Smoking History: Former smoker. Quit [id number]. 12-pack-year smoking history. Maintenance: Intolerant of all inhaled steroids due to thrush induced at 39 even with spacer [name]: Dr. Lake Bells  Chief complaint: Follow-up after pulmonary function test  79 year old male former smoker followed in our office for asthma.  Patient completed full pulmonary function testing on 06/13/2019.  Those results are listed below:  06/13/2019-pulmonary function test-FVC 2.98 (71% predicted), postbronchodilator ratio 63, postbronchodilator FEV1 2.06 (68% predicted), positive bronchodilator response, mid flow reversibility, DLCO 25.17 (101% predicted)  Patient continues to be not maintained on maintenance inhalers.  He is intolerant of ICS.  Even when using a spacer and low-dose ICS patient developed significant thrush.  This has been a barrier to his care.  Even without being on maintenance inhalers he is doing quite well.  He has to use his rescue inhaler about 2 times a month.  He sometimes has occasional wheezing at night.  He is maintained on Singulair as well  as Zyrtec.   He is a former [name].  He needs to establish with a new pulmonologist in our office.  Observations/Objective:  [name] 2009:  FEV1 2.1 liters (61%), ratio 57 Spiro 2013:  FEV1 2.08 liters, ratio 52. [name] 2014:  FEV1 1.98 liters, ratio 55.  [name] 2016:  FEV1 1.87 (54%), ratio 57  11/28/2016-chest x-ray- mild right base subsegmental atelectasis   06/10/2011-CT maxillofacial-left greater than right pansinus mucosal thickening  06/13/2019-pulmonary function test-FVC 2.98 (71% predicted), postbronchodilator ratio 63, postbronchodilator FEV1 2.06 (68% predicted), positive bronchodilator response, mid flow reversibility, DLCO 25.17 (101% predicted)  Social History   Tobacco Use  Smoking Status Former Smoker  . Packs/day: 2.00  . Years: 6.00  . Pack years: 12.00  . Types: Cigarettes  . Quit date: 06/06/1965  . Years since quitting: 54.0  Smokeless Tobacco Never Used  Tobacco Comment   quit 45 yrs ago    Immunization History  Administered Date(s) Administered  . Influenza Split 02/06/2012, 03/06/2013, 04/28/2015  . Influenza Whole 01/04/2009, 04/07/2011  . Influenza, High Dose Seasonal PF 03/19/2018  . Influenza-Unspecified 03/06/2014, 02/05/2016  . Pneumococcal Polysaccharide-23 01/05/2008    Assessment and Plan:  Intrinsic asthma Likely there is an aspect of atopic disease given the asthma as well as previous sinus CT imaging.  Could consider additional lab work such as CBC with differential and IgE if patient symptoms worsen or he begins to flare more.  Patient also could be considered on a LAMA LABA or a LAMA nebulized medication to see if he sees clinical benefit if symptoms worsen.  Plan: Continue rescue inhaler at this time Notify our office if you have  not use your rescue inhaler more often than usual Continue Zyrtec Continue Singulair Establish care in 3 months with either Dr. Vaughan Browner or Dr. Shearon Stalls    Rhinitis, allergic Plan: Continue  Singulair Continue Zyrtec   Follow Up Instructions:  Return in about 3 months (around 09/12/2019), or if symptoms worsen or fail to improve, for Follow up with Dr. Shearon Stalls, Follow up with Dr. Vaughan Browner.   I discussed the assessment and treatment plan with the patient. The patient was provided an opportunity to ask questions and all were answered. The patient agreed with the plan and demonstrated an understanding of the instructions.   The patient was advised to call back or seek an in-person evaluation if the symptoms worsen or if the condition fails to improve as anticipated.  I provided 23 minutes of non-face-to-face time during this encounter.   Lauraine Rinne, NP

## 2019-07-22 ENCOUNTER — Other Ambulatory Visit: Payer: Self-pay | Admitting: Gastroenterology

## 2019-07-22 ENCOUNTER — Other Ambulatory Visit: Payer: Self-pay | Admitting: Pulmonary Disease

## 2019-08-07 DIAGNOSIS — Z125 Encounter for screening for malignant neoplasm of prostate: Secondary | ICD-10-CM | POA: Diagnosis not present

## 2019-08-07 DIAGNOSIS — E7849 Other hyperlipidemia: Secondary | ICD-10-CM | POA: Diagnosis not present

## 2019-08-07 DIAGNOSIS — R946 Abnormal results of thyroid function studies: Secondary | ICD-10-CM | POA: Diagnosis not present

## 2019-08-13 DIAGNOSIS — D225 Melanocytic nevi of trunk: Secondary | ICD-10-CM | POA: Diagnosis not present

## 2019-08-13 DIAGNOSIS — B356 Tinea cruris: Secondary | ICD-10-CM | POA: Diagnosis not present

## 2019-08-13 DIAGNOSIS — L570 Actinic keratosis: Secondary | ICD-10-CM | POA: Diagnosis not present

## 2019-08-13 DIAGNOSIS — Z85828 Personal history of other malignant neoplasm of skin: Secondary | ICD-10-CM | POA: Diagnosis not present

## 2019-08-13 DIAGNOSIS — D485 Neoplasm of uncertain behavior of skin: Secondary | ICD-10-CM | POA: Diagnosis not present

## 2019-08-13 DIAGNOSIS — L111 Transient acantholytic dermatosis [Grover]: Secondary | ICD-10-CM | POA: Diagnosis not present

## 2019-08-13 DIAGNOSIS — L814 Other melanin hyperpigmentation: Secondary | ICD-10-CM | POA: Diagnosis not present

## 2019-08-13 DIAGNOSIS — L821 Other seborrheic keratosis: Secondary | ICD-10-CM | POA: Diagnosis not present

## 2019-08-14 DIAGNOSIS — R946 Abnormal results of thyroid function studies: Secondary | ICD-10-CM | POA: Diagnosis not present

## 2019-08-14 DIAGNOSIS — I872 Venous insufficiency (chronic) (peripheral): Secondary | ICD-10-CM | POA: Diagnosis not present

## 2019-08-14 DIAGNOSIS — Z0000 Encounter for general adult medical examination without abnormal findings: Secondary | ICD-10-CM | POA: Diagnosis not present

## 2019-08-14 DIAGNOSIS — E785 Hyperlipidemia, unspecified: Secondary | ICD-10-CM | POA: Diagnosis not present

## 2019-08-14 DIAGNOSIS — R82998 Other abnormal findings in urine: Secondary | ICD-10-CM | POA: Diagnosis not present

## 2019-08-14 DIAGNOSIS — K219 Gastro-esophageal reflux disease without esophagitis: Secondary | ICD-10-CM | POA: Diagnosis not present

## 2019-08-14 DIAGNOSIS — J309 Allergic rhinitis, unspecified: Secondary | ICD-10-CM | POA: Diagnosis not present

## 2019-08-14 DIAGNOSIS — Z1331 Encounter for screening for depression: Secondary | ICD-10-CM | POA: Diagnosis not present

## 2019-08-14 DIAGNOSIS — Z1339 Encounter for screening examination for other mental health and behavioral disorders: Secondary | ICD-10-CM | POA: Diagnosis not present

## 2019-08-14 DIAGNOSIS — J45909 Unspecified asthma, uncomplicated: Secondary | ICD-10-CM | POA: Diagnosis not present

## 2019-08-19 DIAGNOSIS — Z1212 Encounter for screening for malignant neoplasm of rectum: Secondary | ICD-10-CM | POA: Diagnosis not present

## 2019-08-27 ENCOUNTER — Ambulatory Visit (INDEPENDENT_AMBULATORY_CARE_PROVIDER_SITE_OTHER): Payer: PPO | Admitting: Gastroenterology

## 2019-08-27 ENCOUNTER — Encounter: Payer: Self-pay | Admitting: Gastroenterology

## 2019-08-27 ENCOUNTER — Other Ambulatory Visit: Payer: Self-pay

## 2019-08-27 VITALS — BP 120/68 | HR 70 | Temp 97.7°F | Ht 69.5 in | Wt 192.0 lb

## 2019-08-27 DIAGNOSIS — R1312 Dysphagia, oropharyngeal phase: Secondary | ICD-10-CM

## 2019-08-27 DIAGNOSIS — K219 Gastro-esophageal reflux disease without esophagitis: Secondary | ICD-10-CM | POA: Diagnosis not present

## 2019-08-27 NOTE — Progress Notes (Signed)
    History of Present Illness: This is a 79 year old male GERD with LPR and oropharyngeal dysphagia.  He relates a few episodes of mild choking and strangling not drinking water or other thin liquids.  Symptoms happened several months ago but have not recurred.  Evaluated for similar problems 10 years ago with MBSS.  He notes frequent problems with belching intestinal gas and flatulence.  He states that at times often helps with belching but not his other gas symptoms.   EGD 2006 small hiatal hernia MBSS 2011 moderate oropharyngeal dysphagia Colonoscopy 2013 small hemorrhoids otherwise  Current Medications, Allergies, Past Medical History, Past Surgical History, Family History and Social History were reviewed in [employer] record.    Physical Exam: General: Well developed, well nourished, no acute distress Head: Normocephalic and atraumatic Eyes:  sclerae anicteric, EOMI Ears: Normal auditory acuity Mouth: Not examined, mask on during Covid-19 pandemic Lungs: Clear throughout to auscultation Heart: Regular rate and rhythm; no murmurs, rubs or bruits Abdomen: Soft, non tender and non distended. No masses, hepatosplenomegaly or hernias noted. Normal Bowel sounds Rectal: Not done Musculoskeletal: Symmetrical with no gross deformities  Pulses:  Normal pulses noted Extremities: No clubbing, cyanosis, edema or deformities noted Neurological: Alert oriented x 4, grossly nonfocal Psychological:  Alert and cooperative. Normal mood and affect   Assessment and Recommendations:  1. GERD with LPR. History of oropharyngeal dysphagia. Omeprazole 40 mg po bid and follow antireflux measures.  If oropharyngeal symptoms are frequent or worsening consider speech pathology reevaluation. REV in 1 year.   2. Gas, belching, flatulence.  Given dietary instructions on high gas food avoidance.  Avoid any foods that appear to cause difficulties.  Gas-X or similar product 3 times daily  as needed

## 2019-08-27 NOTE — Patient Instructions (Addendum)
Have your pharmacy contact us when you are ready for the omeprazole refill.   You have been given a gas prevention diet to follow.   You can take over the counter Gas-X three times a day for gas and bloating.  Thank you for choosing me and [facility].  Pricilla Riffle. Dagoberto Ligas., MD., Marval Regal

## 2019-09-27 ENCOUNTER — Other Ambulatory Visit: Payer: Self-pay

## 2019-09-27 ENCOUNTER — Encounter: Payer: Self-pay | Admitting: Pulmonary Disease

## 2019-09-27 ENCOUNTER — Ambulatory Visit (INDEPENDENT_AMBULATORY_CARE_PROVIDER_SITE_OTHER): Payer: PPO

## 2019-09-27 ENCOUNTER — Ambulatory Visit: Payer: PPO | Admitting: Pulmonary Disease

## 2019-09-27 VITALS — BP 114/68 | HR 73 | Temp 98.3°F | Wt 190.8 lb

## 2019-09-27 DIAGNOSIS — J45909 Unspecified asthma, uncomplicated: Secondary | ICD-10-CM | POA: Diagnosis not present

## 2019-09-27 DIAGNOSIS — R0600 Dyspnea, unspecified: Secondary | ICD-10-CM | POA: Diagnosis not present

## 2019-09-27 NOTE — Progress Notes (Signed)
         Erik Gomez    [id number]    [birth year]  Primary Care Physician:Aronson, Delfino Lovett, MD  Referring Physician: Burnard Bunting, MD [address] [place],  [place] [address]  Chief complaint: Follow-up for asthma  HPI: 79 year old with history of asthma, GERD, vitamin D deficiency Previously followed by Dr. Lake Bells.  Per notes he is intolerant of ICS. Even when using a spacer and low-dose ICS patient developed significant thrush.  Though the patient does not seem to recall this.  Currently on just albuterol which he uses 1-2 times every month.  States that his breathing is under good control with no issues.  Pets: No pets Occupation: Retired Administrator Exposures: No mold, hot tub, Jacuzzi or asbestos exposure.  He has down pillows on the couch but hardly sits on it Smoking history: 12-pack-year smoker.  Quit in 1970 Travel history: No significant travel history Relevant family history: No significant family history of lung disease  Outpatient Encounter Medications as of 09/27/2019  Medication Sig  . Acetaminophen (TYLENOL PO) Per bottle as needed  . Albuterol Sulfate (PROAIR RESPICLICK) [id number] (90 Base) MCG/ACT AEPB Inhale 2 puffs into the lungs every 6 (six) hours as needed.  . Biotin 1000 MCG tablet Take 1,000 mcg by mouth daily.  . cetirizine (ZYRTEC) 10 MG tablet Take 10 mg by mouth daily at 2 PM.   . montelukast (SINGULAIR) 10 MG tablet Take 1 tablet by mouth once daily  . omeprazole (PRILOSEC) 40 MG capsule Take 1 capsule by mouth twice daily  . vitamin D, CHOLECALCIFEROL, 400 UNITS tablet Take 1,000 Units by mouth daily.    No facility-administered encounter medications on file as of 09/27/2019.   Physical Exam: Blood pressure 114/68, pulse 73, temperature 98.3 F (36.8 C), temperature source Temporal, weight 190 lb 12.8 oz (86.5 kg), SpO2 96 %. Gen:      No acute distress HEENT:  EOMI, sclera anicteric Neck:     No masses; no thyromegaly Lungs:    Clear to  auscultation bilaterally; normal respiratory effort CV:         Regular rate and rhythm; no murmurs Abd:      + bowel sounds; soft, non-tender; no palpable masses, no distension Ext:    No edema; adequate peripheral perfusion Skin:      Warm and dry; no rash Neuro: alert and oriented x 3 Psych: normal mood and affect  Data Reviewed: Imaging: Chest x-ray 11/28/2016-mild continue still look forward he got labs recently at his right base subsegmental atelectasis.  Borderline cardiomegaly. I have reviewed the images personally.  PFTs: 06/13/2019 FVC 3.24 [77%], FEV1 2.06 [68%], F/F 63, TLC 7.50 [96%], DLCO 25.17 [101%] Moderate obstructive airway disease with air trapping and bronchodilator response.  Labs:  Assessment:  Mild intermittent asthma Appears to be well controlled on short-acting beta agonist as needed. We will get chest x-ray today Continue albuterol as needed  He had labs recently at his primary care.  We will get these for review  Plan/Recommendations: Continue albuterol as needed Chest x-ray Get labs from primary care  Marshell Garfinkel MD [facility] 09/27/2019, 2:22 PM  CC: Burnard Bunting, MD

## 2019-09-27 NOTE — Patient Instructions (Signed)
I am glad you are doing well with regard to your breathing We will get a chest x-ray today Continue albuterol as needed  Follow-up in a year.

## 2019-09-30 NOTE — Progress Notes (Signed)
Patient identification verified. Results of recent CXR reviewed. Per Dr. Vaughan Browner, chest x ray was normal. Patient verbalized understanding of results with no questions at this time.

## 2019-10-29 ENCOUNTER — Other Ambulatory Visit: Payer: Self-pay | Admitting: Gastroenterology

## 2020-01-01 DIAGNOSIS — R2231 Localized swelling, mass and lump, right upper limb: Secondary | ICD-10-CM | POA: Diagnosis not present

## 2020-01-01 DIAGNOSIS — R238 Other skin changes: Secondary | ICD-10-CM | POA: Diagnosis not present

## 2020-01-07 DIAGNOSIS — Z85828 Personal history of other malignant neoplasm of skin: Secondary | ICD-10-CM | POA: Diagnosis not present

## 2020-01-07 DIAGNOSIS — L309 Dermatitis, unspecified: Secondary | ICD-10-CM | POA: Diagnosis not present

## 2020-02-18 DIAGNOSIS — H0100A Unspecified blepharitis right eye, upper and lower eyelids: Secondary | ICD-10-CM | POA: Diagnosis not present

## 2020-02-18 DIAGNOSIS — H43813 Vitreous degeneration, bilateral: Secondary | ICD-10-CM | POA: Diagnosis not present

## 2020-02-18 DIAGNOSIS — Z961 Presence of intraocular lens: Secondary | ICD-10-CM | POA: Diagnosis not present

## 2020-02-18 DIAGNOSIS — H52203 Unspecified astigmatism, bilateral: Secondary | ICD-10-CM | POA: Diagnosis not present

## 2020-03-14 DIAGNOSIS — Z23 Encounter for immunization: Secondary | ICD-10-CM | POA: Diagnosis not present

## 2020-04-16 DIAGNOSIS — Z4789 Encounter for other orthopedic aftercare: Secondary | ICD-10-CM | POA: Diagnosis not present

## 2020-04-16 DIAGNOSIS — M71341 Other bursal cyst, right hand: Secondary | ICD-10-CM | POA: Diagnosis not present

## 2020-05-18 ENCOUNTER — Encounter: Payer: Self-pay | Admitting: Primary Care

## 2020-05-18 ENCOUNTER — Ambulatory Visit (INDEPENDENT_AMBULATORY_CARE_PROVIDER_SITE_OTHER): Payer: PPO

## 2020-05-18 ENCOUNTER — Ambulatory Visit: Payer: PPO | Admitting: Primary Care

## 2020-05-18 ENCOUNTER — Other Ambulatory Visit: Payer: Self-pay

## 2020-05-18 VITALS — BP 138/74 | HR 70 | Temp 98.0°F | Ht 70.0 in | Wt 184.0 lb

## 2020-05-18 DIAGNOSIS — J45909 Unspecified asthma, uncomplicated: Secondary | ICD-10-CM

## 2020-05-18 MED ORDER — PROAIR RESPICLICK 108 (90 BASE) MCG/ACT IN AEPB: 2.0000 | INHALATION_SPRAY | Freq: Four times a day (QID) | RESPIRATORY_TRACT | 3 refills | Status: DC | PRN

## 2020-05-18 MED ORDER — PREDNISONE 10 MG PO TABS: ORAL_TABLET | ORAL | 0 refills | Status: DC

## 2020-05-18 NOTE — Progress Notes (Signed)
@Patient  ID: Erik Gomez, male    DOB: January 01, 1941, 79 y.o.   MRN: [id number]  Chief Complaint  Patient presents with  . Follow-up    Reports "tight" breathing and nonproductive cough primarily at night over the past few weeks.    Referring provider: Burnard Bunting, MD  HPI: 79 year old male, former smoker quit 1967 (12-pack-year history).  Past medical history significant for intrinsic asthma, allergic rhinitis, hiatal hernia, GERD.  Patient of Dr. Vaughan Browner, last seen in office by him on 09/27/2019.  Previous [facility] pulmonary encounter: 09/27/19- Dr. Vaughan Browner  79 year old with history of asthma, GERD, vitamin D deficiency Previously followed by Dr. Lake Bells.  Per notes he is intolerant of ICS. Even when using a spacer and low-dose ICS patient developed significant thrush.  Though the patient does not seem to recall this.  Currently on just albuterol which he uses 1-2 times every month.  States that his breathing is under good control with no issues.  Pets: No pets Occupation: Retired Administrator Exposures: No mold, hot tub, Jacuzzi or asbestos exposure.  He has down pillows on the couch but hardly sits on it Smoking history: 12-pack-year smoker.  Quit in 1970 Travel history: No significant travel history Relevant family history: No significant family history of lung disease  05/18/2020 - Interim hx  Patient presents today for an acute visit with complaints of cough and wheeze. He is intolerant to ICS inhalers, even with spacer he developed significant thrush symptoms. Maintained on SABA as needed and Singulair.   Accompanied by his wife today. Reports developing chest tightness 3-4 days ago but has since resolved. In the evening he has been coughing more and needing to use albuterol rescue inhaler. Waking up short of breath and coughing. He is compliant with Prilosec twice daily as prescribed.   Data Reviewed: Imaging: Chest x-ray 11/28/2016-mild continue still look forward he got  labs recently at his right base subsegmental atelectasis.  Borderline cardiomegaly. I have reviewed the images personally.  PFTs: 06/13/2019 FVC 3.24 [77%], FEV1 2.06 [68%], F/F 63, TLC 7.50 [96%], DLCO 25.17 [101%] Moderate obstructive airway disease with air trapping and bronchodilator response.   Allergies  Allergen Reactions  . Codeine     Upset stomach  . Meloxicam     Upset stomach  . Pantoprazole Sodium     Upset stomach    Immunization History  Administered Date(s) Administered  . Influenza Split 02/06/2012, 03/06/2013, 04/28/2015  . Influenza Whole 01/04/2009, 04/07/2011  . Influenza, High Dose Seasonal PF 03/19/2018  . Influenza-Unspecified 03/06/2014, 02/05/2016  . PFIZER SARS-COV-2 Vaccination 07/18/2019, 08/25/2019  . Pneumococcal Polysaccharide-23 01/05/2008  . Zoster Recombinat (Shingrix) 10/20/2017, 12/22/2017    Past Medical History:  Diagnosis Date  . Arthritis   . Asthma, intrinsic   . Cataract    per pt "beginning of a cataract"  . Chronic headache   . GERD (gastroesophageal reflux disease)    w/ LPR  . Hiatal hernia   . Internal hemorrhoid   . Peyronie disease   . Vitamin D deficiency     Tobacco History: Social History   Tobacco Use  Smoking Status Former Smoker  . Packs/day: 2.00  . Years: 6.00  . Pack years: 12.00  . Types: Cigarettes  . Quit date: 06/06/1965  . Years since quitting: 54.9  Smokeless Tobacco Never Used  Tobacco Comment   quit 45 yrs ago    Counseling given: Not Answered Comment: quit 45 yrs ago    Outpatient Medications Prior to  Visit  Medication Sig Dispense Refill  . Acetaminophen (TYLENOL PO) Per bottle as needed    . Biotin 1000 MCG tablet Take 1,000 mcg by mouth daily.    . cetirizine (ZYRTEC) 10 MG tablet Take 10 mg by mouth daily at 2 PM.     . montelukast (SINGULAIR) 10 MG tablet Take 1 tablet by mouth once daily 90 tablet 3  . omeprazole (PRILOSEC) 40 MG capsule TAKE 1 CAPSULE BY MOUTH TWICE DAILY .  APPOINTMENT REQUIRED FOR FUTURE REFILLS 180 capsule 2  . vitamin D, CHOLECALCIFEROL, 400 UNITS tablet Take 1,000 Units by mouth daily.     . Albuterol Sulfate (PROAIR RESPICLICK) 734 (90 Base) MCG/ACT AEPB Inhale 2 puffs into the lungs every 6 (six) hours as needed. 1 each 3   No facility-administered medications prior to visit.   Review of Systems  Review of Systems  Respiratory: Positive for cough and chest tightness.    Physical Exam  BP 138/74   Pulse 70   Temp 98 F (36.7 C)   Ht 5\' 10"  (1.778 m)   Wt 184 lb (83.5 kg)   SpO2 96%   BMI 26.40 kg/m  Physical Exam Constitutional:      Appearance: Normal appearance.  HENT:     Head: Normocephalic and atraumatic.  Cardiovascular:     Rate and Rhythm: Normal rate and regular rhythm.  Pulmonary:     Effort: Pulmonary effort is normal.     Breath sounds: Normal breath sounds. No wheezing or rhonchi.  Musculoskeletal:        General: Normal range of motion.  Skin:    General: Skin is warm and dry.  Neurological:     General: No focal deficit present.     Mental Status: He is alert and oriented to person, place, and time. Mental status is at baseline.  Psychiatric:        Mood and Affect: Mood normal.        Behavior: Behavior normal.        Thought Content: Thought content normal.        Judgment: Judgment normal.      Lab Results:  CBC No results found for: WBC, RBC, HGB, HCT, PLT, MCV, MCH, MCHC, RDW, LYMPHSABS, MONOABS, EOSABS, BASOSABS  BMET No results found for: NA, K, CL, CO2, GLUCOSE, BUN, CREATININE, CALCIUM, GFRNONAA, GFRAA  BNP No results found for: BNP  ProBNP No results found for: PROBNP  Imaging: No results found.   Assessment & Plan:   Intrinsic asthma Patient reports chest tightness 3-4 days ago which has now resolved. New nocturnal cough and dyspnea. Previously we was well controlled up until recently. He is intolerant to ICS d/t thrush. Asthma symptoms managed with PRN SABA, which he has  been using more at night. He is compliance with Prilosec twice daily. Will treat for suspected acute asthma exacerbation and check routine CXR. If he continues to have breakthrough symptoms would consider adding LAMA such as Spiriva as he did have obstruction on PFTs and was a former smoker.   RX: Prednisone 20mg  x 5 days Refill Albuterol 2 puffs every 6 hours prn sob/wheezing  Follow-up: 6 months with Dr. Vaughan Browner or sooner if not better    Martyn Ehrich, NP 05/18/2020

## 2020-05-18 NOTE — Progress Notes (Signed)
Please let patient know CXR looked normal. It showed clear lungs, no active cardiopulmonary process

## 2020-05-18 NOTE — Patient Instructions (Addendum)
Pleasure meeting you today  You have intrinsic asthma which we normally treat with inhaled steriods, however, you were intolerant to these due to thrush symptoms. If you find yourself needing to you your Albuterol rescue inhaler more frequently that is a sign that you need to notify our office for treatment of an asthma exacerbation with oral steroids. If you continue to have breakthrough asthma symptoms consider adding a daily maintenance called Spiriva   RX: Prednisone 20mg  x 5 days Refill Albuterol   Follow-up: 6 months with Dr. Vaughan Browner or sooner if not better

## 2020-05-18 NOTE — Assessment & Plan Note (Signed)
Patient reports chest tightness 3-4 days ago which has now resolved. New nocturnal cough and dyspnea. Previously we was well controlled up until recently. He is intolerant to ICS d/t thrush. Asthma symptoms managed with PRN SABA, which he has been using more at night. He is compliance with Prilosec twice daily. Will treat for suspected acute asthma exacerbation and check routine CXR. If he continues to have breakthrough symptoms would consider adding LAMA such as Spiriva as he did have obstruction on PFTs and was a former smoker.   RX: Prednisone 20mg  x 5 days Refill Albuterol 2 puffs every 6 hours prn sob/wheezing  Follow-up: 6 months with Dr. Vaughan Browner or sooner if not better

## 2020-05-19 ENCOUNTER — Encounter: Payer: Self-pay | Admitting: *Deleted

## 2020-07-14 ENCOUNTER — Other Ambulatory Visit: Payer: Self-pay | Admitting: Gastroenterology

## 2020-07-14 ENCOUNTER — Other Ambulatory Visit: Payer: Self-pay | Admitting: Pulmonary Disease

## 2020-08-12 DIAGNOSIS — Z85828 Personal history of other malignant neoplasm of skin: Secondary | ICD-10-CM | POA: Diagnosis not present

## 2020-08-12 DIAGNOSIS — D225 Melanocytic nevi of trunk: Secondary | ICD-10-CM | POA: Diagnosis not present

## 2020-08-12 DIAGNOSIS — L821 Other seborrheic keratosis: Secondary | ICD-10-CM | POA: Diagnosis not present

## 2020-08-12 DIAGNOSIS — L570 Actinic keratosis: Secondary | ICD-10-CM | POA: Diagnosis not present

## 2020-08-27 ENCOUNTER — Other Ambulatory Visit: Payer: Self-pay

## 2020-08-27 ENCOUNTER — Other Ambulatory Visit (HOSPITAL_COMMUNITY): Payer: Self-pay

## 2020-08-27 ENCOUNTER — Ambulatory Visit: Payer: PPO | Admitting: Gastroenterology

## 2020-08-27 ENCOUNTER — Encounter: Payer: Self-pay | Admitting: Gastroenterology

## 2020-08-27 VITALS — BP 108/70 | HR 59 | Ht 70.0 in | Wt 182.4 lb

## 2020-08-27 DIAGNOSIS — K219 Gastro-esophageal reflux disease without esophagitis: Secondary | ICD-10-CM | POA: Diagnosis not present

## 2020-08-27 DIAGNOSIS — F40233 Fear of injury: Secondary | ICD-10-CM

## 2020-08-27 DIAGNOSIS — R1310 Dysphagia, unspecified: Secondary | ICD-10-CM

## 2020-08-27 MED ORDER — OMEPRAZOLE 40 MG PO CPDR: DELAYED_RELEASE_CAPSULE | ORAL | 11 refills | Status: DC

## 2020-08-27 NOTE — Patient Instructions (Signed)
You have been scheduled for a modified barium swallow on 09/15/20 at 10:00am. Please arrive 15 minutes prior to your test for registration. You can not have food or drink 3 hours prior to this test. You will go to [facility] (1st Floor) for your appointment. Should you need to cancel or reschedule your appointment, please contact [phone] Gershon Mussel [place]) or [phone] [place]). _____________________________________________________________________ A Modified Barium Swallow Study, or MBS, is a special x-ray that is taken to check swallowing skills. It is carried out by a Stage manager and a Psychologist, clinical (SLP). During this test, yourmouth, throat, and esophagus, a muscular tube which connects your mouth to your stomach, is checked. The test will help you, your doctor, and the SLP plan what types of foods and liquids are easier for you to swallow. The SLP will also identify positions and ways to help you swallow more easily and safely. What will happen during an MBS? You will be taken to an x-ray room and seated comfortably. You will be asked to swallow small amounts of food and liquid mixed with barium. Barium is a liquid or paste that allows images of your mouth, throat and esophagus to be seen on x-ray. The x-ray captures moving images of the food you are swallowing as it travels from your mouth through your throat and into your esophagus. This test helps identify whether food or liquid is entering your lungs (aspiration). The test also shows which part of your mouth or throat lacks strength or coordination to move the food or liquid in the right direction. This test typically takes 30 minutes to 1 hour to complete. ___________________________________________________________ Thank you for choosing me and [facility].  Pricilla Riffle. Dagoberto Ligas., MD., [facility] Regal

## 2020-08-27 NOTE — Addendum Note (Signed)
Addended by: Dorisann Frames L on: 08/27/2020 12:19 PM   Modules accepted: Orders

## 2020-08-27 NOTE — Progress Notes (Signed)
    History of Present Illness: This is a 80 year old male returning for follow-up of GERD with LPR.  He states his reflux symptoms remain under very good control.  He has noted more frequent intermittent problems with choking and coughing when swallowing liquids.  Previously underwent speech pathology M BSS evaluation and 2011.  I am unable to access the complete note however C4-C5 and C5-C6 osteophytes were noted.  Small single sips and double swallows every 2-3 sips was recommended.  Current Medications, Allergies, Past Medical History, Past Surgical History, Family History and Social History were reviewed in [employer] record.   Physical Exam: General: Well developed, well nourished, no acute distress Head: Normocephalic and atraumatic Eyes: Sclerae anicteric, EOMI Ears: Normal auditory acuity Mouth: Not examined, mask on during Covid-19 pandemic Lungs: Clear throughout to auscultation Heart: Regular rate and rhythm; no murmurs, rubs or bruits Abdomen: Soft, non tender and non distended. No masses, hepatosplenomegaly or hernias noted. Normal Bowel sounds Rectal: Not done Musculoskeletal: Symmetrical with no gross deformities  Pulses:  Normal pulses noted Extremities: No clubbing, cyanosis, edema or deformities noted Neurological: Alert oriented x 4, grossly nonfocal Psychological:  Alert and cooperative. Normal mood and affect   Assessment and Recommendations:  1. GERD with LPR. Intermittent choking, coughing with liquids.  Continue to follow antireflux measures.  Continue omeprazole 40 mg p.o. twice daily.  Schedule MBSS and and barium esophagram.  2.  CRC screening, average risk. Up-to-date with a normal colonoscopy in September 2013.

## 2020-08-28 ENCOUNTER — Telehealth: Payer: Self-pay | Admitting: Gastroenterology

## 2020-08-28 MED ORDER — OMEPRAZOLE 40 MG PO CPDR: DELAYED_RELEASE_CAPSULE | ORAL | 3 refills | Status: DC

## 2020-08-28 NOTE — Telephone Encounter (Signed)
Apologized to patient and told him I was unaware he wanted a 3 month supply. Informed patient that in the future I will send a 3 month supply. Patient verbalized understanding.

## 2020-09-14 DIAGNOSIS — R946 Abnormal results of thyroid function studies: Secondary | ICD-10-CM | POA: Diagnosis not present

## 2020-09-14 DIAGNOSIS — E785 Hyperlipidemia, unspecified: Secondary | ICD-10-CM | POA: Diagnosis not present

## 2020-09-14 DIAGNOSIS — Z125 Encounter for screening for malignant neoplasm of prostate: Secondary | ICD-10-CM | POA: Diagnosis not present

## 2020-09-15 ENCOUNTER — Ambulatory Visit (HOSPITAL_COMMUNITY)
Admission: RE | Admit: 2020-09-15 | Discharge: 2020-09-15 | Disposition: A | Discharge status: Home / Self Care | Payer: PPO | Source: Ambulatory Visit | Attending: Gastroenterology | Admitting: Gastroenterology

## 2020-09-15 ENCOUNTER — Other Ambulatory Visit: Payer: Self-pay

## 2020-09-15 DIAGNOSIS — K219 Gastro-esophageal reflux disease without esophagitis: Secondary | ICD-10-CM | POA: Insufficient documentation

## 2020-09-15 DIAGNOSIS — R1310 Dysphagia, unspecified: Secondary | ICD-10-CM | POA: Diagnosis not present

## 2020-09-15 DIAGNOSIS — F40233 Fear of injury: Secondary | ICD-10-CM | POA: Diagnosis not present

## 2020-09-15 DIAGNOSIS — R079 Chest pain, unspecified: Secondary | ICD-10-CM | POA: Diagnosis not present

## 2020-09-15 DIAGNOSIS — K2289 Other specified disease of esophagus: Secondary | ICD-10-CM | POA: Diagnosis not present

## 2020-09-16 DIAGNOSIS — E663 Overweight: Secondary | ICD-10-CM | POA: Diagnosis not present

## 2020-09-16 DIAGNOSIS — Z1339 Encounter for screening examination for other mental health and behavioral disorders: Secondary | ICD-10-CM | POA: Diagnosis not present

## 2020-09-16 DIAGNOSIS — J45909 Unspecified asthma, uncomplicated: Secondary | ICD-10-CM | POA: Diagnosis not present

## 2020-09-16 DIAGNOSIS — R82998 Other abnormal findings in urine: Secondary | ICD-10-CM | POA: Diagnosis not present

## 2020-09-16 DIAGNOSIS — K219 Gastro-esophageal reflux disease without esophagitis: Secondary | ICD-10-CM | POA: Diagnosis not present

## 2020-09-16 DIAGNOSIS — Z1331 Encounter for screening for depression: Secondary | ICD-10-CM | POA: Diagnosis not present

## 2020-09-16 DIAGNOSIS — R1310 Dysphagia, unspecified: Secondary | ICD-10-CM | POA: Diagnosis not present

## 2020-09-16 DIAGNOSIS — Z0000 Encounter for general adult medical examination without abnormal findings: Secondary | ICD-10-CM | POA: Diagnosis not present

## 2020-09-16 DIAGNOSIS — E785 Hyperlipidemia, unspecified: Secondary | ICD-10-CM | POA: Diagnosis not present

## 2020-09-16 DIAGNOSIS — I872 Venous insufficiency (chronic) (peripheral): Secondary | ICD-10-CM | POA: Diagnosis not present

## 2020-09-22 DIAGNOSIS — Z1212 Encounter for screening for malignant neoplasm of rectum: Secondary | ICD-10-CM | POA: Diagnosis not present

## 2020-10-02 ENCOUNTER — Other Ambulatory Visit: Payer: Self-pay

## 2020-10-02 ENCOUNTER — Encounter: Payer: Self-pay | Admitting: Pulmonary Disease

## 2020-10-02 ENCOUNTER — Ambulatory Visit: Payer: PPO | Admitting: Pulmonary Disease

## 2020-10-02 VITALS — BP 124/64 | HR 58 | Temp 98.2°F | Ht 70.0 in | Wt 181.8 lb

## 2020-10-02 DIAGNOSIS — J45909 Unspecified asthma, uncomplicated: Secondary | ICD-10-CM

## 2020-10-02 MED ORDER — BUDESONIDE-FORMOTEROL FUMARATE 160-4.5 MCG/ACT IN AERO: 2.0000 | INHALATION_SPRAY | Freq: Two times a day (BID) | RESPIRATORY_TRACT | 5 refills | Status: DC

## 2020-10-02 NOTE — Progress Notes (Signed)
Erik Gomez    [id number]    [birth year]  Primary Care Physician:Aronson, Delfino Lovett, MD  Referring Physician: Burnard Bunting, MD [address] [place],  [place] [address]  Chief complaint: Follow-up for asthma  HPI: 80 year old with history of asthma, GERD, vitamin D deficiency Previously followed by Dr. Lake Bells.  Per notes he is intolerant of ICS. Even when using a spacer and low-dose ICS patient developed significant thrush.  Though the patient does not seem to recall this.  Currently on just albuterol which he uses 1-2 times every month.  States that his breathing is under good control with no issues.  Pets: No pets Occupation: Retired Administrator Exposures: No mold, hot tub, Jacuzzi or asbestos exposure.  He has down pillows on the couch but hardly sits on it Smoking history: 12-pack-year smoker.  Quit in 1970 Travel history: No significant travel history Relevant family history: No significant family history of lung disease  Interval history: Maintained on albuterol Reports increasing dyspnea, cough, clear mucus production with congestion and wheezing for the past few months Wife reports wheezing at night when he lays down. He starting to use the albuterol more often.  Continues on Singulair  Outpatient Encounter Medications as of 10/02/2020  Medication Sig  . Acetaminophen (TYLENOL PO) Per bottle as needed  . Albuterol Sulfate (PROAIR RESPICLICK) 016 (90 Base) MCG/ACT AEPB Inhale 2 puffs into the lungs every 6 (six) hours as needed.  . Biotin 1000 MCG tablet Take 1,000 mcg by mouth daily.  . cetirizine (ZYRTEC) 10 MG tablet Take 10 mg by mouth daily at 2 PM.   . montelukast (SINGULAIR) 10 MG tablet Take 1 tablet by mouth once daily  . omeprazole (PRILOSEC) 40 MG capsule TAKE 1 CAPSULE BY MOUTH TWICE DAILY .  . vitamin D, CHOLECALCIFEROL, 400 UNITS tablet Take 1,000 Units by mouth daily.   No facility-administered encounter medications on file as of  10/02/2020.   Physical Exam: Blood pressure 124/64, pulse (!) 58, temperature 98.2 F (36.8 C), temperature source Temporal, height 5\' 10"  (1.778 m), weight 181 lb 12.8 oz (82.5 kg), SpO2 100 %. Gen:      No acute distress HEENT:  EOMI, sclera anicteric Neck:     No masses; no thyromegaly Lungs:    Clear to auscultation bilaterally; normal respiratory effort CV:         Regular rate and rhythm; no murmurs Abd:      + bowel sounds; soft, non-tender; no palpable masses, no distension Ext:    No edema; adequate peripheral perfusion Skin:      Warm and dry; no rash Neuro: alert and oriented x 3 Psych: normal mood and affect  Data Reviewed: Imaging: Chest x-ray 11/28/2016-mild continue still look forward he got labs recently at his right base subsegmental atelectasis.  Borderline cardiomegaly.  Chest x-ray 05/18/2020- no active cardiopulmonary disease   Modified barium swallow 09/15/2020-no aspiration I have reviewed the images personally.  PFTs: 06/13/2019 FVC 3.24 [77%], FEV1 2.06 [68%], F/F 63, TLC 7.50 [96%], DLCO 25.17 [101%] Moderate obstructive airway disease with air trapping and bronchodilator response.  Labs:  Assessment:  Asthma Has worsening symptoms with exacerbation He is just on albuterol as needed.  Previously intolerant of ICS due to thrush.  Check CBC differential, IgE Start controller medication with Symbicort 160/4.5 with spacer.  Reminded to rinse mouth after each use. Continue Singulair  GERD, LPR Follows with [facility] Continue PPI  Plan/Recommendations: CBC, IgE Symbicort  Marshell Garfinkel MD [facility] 10/02/2020, 3:08 PM  CC: Burnard Bunting, MD

## 2020-10-02 NOTE — Patient Instructions (Addendum)
We will get some labs today CBC with differential, IgE for evaluation of asthma Start Symbicort 160.  Take 2 puffs in the morning and 2 puffs at evening Continue albuterol rescue inhaler Follow-up in 3 months

## 2020-10-05 LAB — CBC WITH DIFFERENTIAL/PLATELET
Absolute Monocytes: 377 cells/uL (ref 200–950)
Basophils Absolute: 60 cells/uL (ref 0–200)
Basophils Relative: 1.3 %
Eosinophils Absolute: 101 cells/uL (ref 15–500)
Eosinophils Relative: 2.2 %
HCT: 42.3 % (ref 38.5–50.0)
Hemoglobin: 14.5 g/dL (ref 13.2–17.1)
Lymphs Abs: 1168 cells/uL (ref 850–3900)
MCH: 31.5 pg (ref 27.0–33.0)
MCHC: 34.3 g/dL (ref 32.0–36.0)
MCV: 92 fL (ref 80.0–100.0)
MPV: 10.8 fL (ref 7.5–12.5)
Monocytes Relative: 8.2 %
Neutro Abs: 2893 cells/uL (ref 1500–7800)
Neutrophils Relative %: 62.9 %
Platelets: 199 10*3/uL (ref 140–400)
RBC: 4.6 10*6/uL (ref 4.20–5.80)
RDW: 12.3 % (ref 11.0–15.0)
Total Lymphocyte: 25.4 %
WBC: 4.6 10*3/uL (ref 3.8–10.8)

## 2020-10-05 LAB — IGE: IgE (Immunoglobulin E), Serum: 527 kU/L — ABNORMAL HIGH (ref <=114)

## 2020-10-08 ENCOUNTER — Telehealth: Payer: Self-pay | Admitting: Pulmonary Disease

## 2020-10-08 NOTE — Telephone Encounter (Signed)
Called and spoke to pt. Informed him of the results and recs per Dr. Vaughan Browner. Pt verbalized understanding and requesting results be mailed to his home address, address verified.   Will forward to triage to print and mail results.   ---------------------------------------------------------- Labs show elevated IgE which is consistent with asthma and allergies. Continue current therapy.

## 2020-10-08 NOTE — Telephone Encounter (Signed)
Labs have been printed and placed in mail to go to patient. Nothing further needed at this time.

## 2020-11-03 DIAGNOSIS — L817 Pigmented purpuric dermatosis: Secondary | ICD-10-CM | POA: Diagnosis not present

## 2020-11-03 DIAGNOSIS — Z85828 Personal history of other malignant neoplasm of skin: Secondary | ICD-10-CM | POA: Diagnosis not present

## 2020-12-23 DIAGNOSIS — M67811 Other specified disorders of synovium, right shoulder: Secondary | ICD-10-CM | POA: Diagnosis not present

## 2020-12-23 DIAGNOSIS — M25511 Pain in right shoulder: Secondary | ICD-10-CM | POA: Diagnosis not present

## 2020-12-30 ENCOUNTER — Other Ambulatory Visit: Payer: Self-pay

## 2020-12-30 ENCOUNTER — Encounter: Payer: Self-pay | Admitting: Pulmonary Disease

## 2020-12-30 ENCOUNTER — Ambulatory Visit: Payer: PPO | Admitting: Pulmonary Disease

## 2020-12-30 VITALS — BP 132/62 | HR 57 | Ht 70.0 in | Wt 179.6 lb

## 2020-12-30 DIAGNOSIS — J45909 Unspecified asthma, uncomplicated: Secondary | ICD-10-CM

## 2020-12-30 NOTE — Patient Instructions (Signed)
I am glad you are doing well with your breathing He can cut down the Symbicort to 1 puff twice daily Continue antiacid medication  Follow-up in 6 months

## 2020-12-30 NOTE — Progress Notes (Signed)
Erik Gomez    [id number]    Nov 27, 1940  Primary Care Physician:Aronson, Delfino Lovett, MD  Referring Physician: Burnard Bunting, MD [address] [place],  [place] [address]  Chief complaint: Follow-up for asthma  HPI: 80 year old with history of asthma, GERD, vitamin D deficiency Per notes he is intolerant of ICS. Even when using a spacer and low-dose ICS patient developed significant thrush.  Though the patient does not seem to recall this.  Currently on just albuterol which he uses 1-2 times every month.   Pets: No pets Occupation: Retired Administrator Exposures: No mold, hot tub, Jacuzzi or asbestos exposure.  He has down pillows on the couch but hardly sits on it Smoking history: 12-pack-year smoker.  Quit in 1970 Travel history: No significant travel history Relevant family history: No significant family history of lung disease  Interval history: Started on Symbicort at last visit for worsening dyspnea with wheezing.  States that his breathing is improved. Continues on Singulair  Has intermittent hoarseness of voice Follows with Dr. Fuller Plan for GERD, LPR.  Recently had a modified barium swallow on 09/15/2020 which did not show any evidence of aspiration  Outpatient Encounter Medications as of 12/30/2020  Medication Sig   Acetaminophen (TYLENOL PO) Per bottle as needed   Albuterol Sulfate (PROAIR RESPICLICK) 123XX123 (90 Base) MCG/ACT AEPB Inhale 2 puffs into the lungs every 6 (six) hours as needed.   Biotin 1000 MCG tablet Take 1,000 mcg by mouth daily.   budesonide-formoterol (SYMBICORT) 160-4.5 MCG/ACT inhaler Inhale 2 puffs into the lungs in the morning and at bedtime.   cetirizine (ZYRTEC) 10 MG tablet Take 10 mg by mouth daily at 2 PM.    montelukast (SINGULAIR) 10 MG tablet Take 1 tablet by mouth once daily   omeprazole (PRILOSEC) 40 MG capsule TAKE 1 CAPSULE BY MOUTH TWICE DAILY .   vitamin D, CHOLECALCIFEROL, 400 UNITS tablet Take 1,000 Units by mouth daily.    No facility-administered encounter medications on file as of 12/30/2020.   Physical Exam: Blood pressure 132/62, pulse (!) 57, height '5\' 10"'$  (1.778 m), weight 179 lb 9.6 oz (81.5 kg), SpO2 98 %. Gen:      No acute distress HEENT:  EOMI, sclera anicteric Neck:     No masses; no thyromegaly Lungs:    Clear to auscultation bilaterally; normal respiratory effort CV:         Regular rate and rhythm; no murmurs Abd:      + bowel sounds; soft, non-tender; no palpable masses, no distension Ext:    No edema; adequate peripheral perfusion Skin:      Warm and dry; no rash Neuro: alert and oriented x 3 Psych: normal mood and affect   Data Reviewed: Imaging: Chest x-ray 11/28/2016-mild continue still look forward he got labs recently at his right base subsegmental atelectasis.  Borderline cardiomegaly.  Chest x-ray 05/18/2020- no active cardiopulmonary disease   Modified barium swallow 09/15/2020-no aspiration I have reviewed the images personally.  PFTs: 06/13/2019 FVC 3.24 [77%], FEV1 2.06 [68%], F/F 63, TLC 7.50 [96%], DLCO 25.17 [101%] Moderate obstructive airway disease with air trapping and bronchodilator response.  Labs: CBC 10/02/2020-WBC 4.6, eos 2.2%, absolute eosinophil count 101 IgE 10/02/2020-527   Assessment:  Asthma Started on Symbicort in April 2022 with good control of symptoms Continues to have intermittent hoarseness of voice secondary to GERD, LPR  He is using Symbicort with spacer.  Reminded to rinse mouth and gargle after each use  Reduce Symbicort dose to 1 puff twice daily.  Consider further dose reduction if his symptoms are stable Continue Singulair  GERD, LPR Follows with [facility] Continue PPI  Discussed ENT evaluation for persistent hoarseness but he would like to hold off for now  Plan/Recommendations: Symbicort.  Reduce dose to 1 puff twice daily  Marshell Garfinkel MD [facility] 12/30/2020, 11:42 AM  CC: Burnard Bunting,  MD

## 2021-01-21 ENCOUNTER — Other Ambulatory Visit: Payer: Self-pay | Admitting: Pulmonary Disease

## 2021-02-05 DIAGNOSIS — M19011 Primary osteoarthritis, right shoulder: Secondary | ICD-10-CM | POA: Diagnosis not present

## 2021-02-05 DIAGNOSIS — M1990 Unspecified osteoarthritis, unspecified site: Secondary | ICD-10-CM

## 2021-02-05 DIAGNOSIS — M25511 Pain in right shoulder: Secondary | ICD-10-CM | POA: Diagnosis not present

## 2021-02-05 HISTORY — DX: Unspecified osteoarthritis, unspecified site: M19.90

## 2021-02-18 DIAGNOSIS — M25511 Pain in right shoulder: Secondary | ICD-10-CM | POA: Diagnosis not present

## 2021-02-18 DIAGNOSIS — M19011 Primary osteoarthritis, right shoulder: Secondary | ICD-10-CM | POA: Diagnosis not present

## 2021-02-19 DIAGNOSIS — H35363 Drusen (degenerative) of macula, bilateral: Secondary | ICD-10-CM | POA: Diagnosis not present

## 2021-02-19 DIAGNOSIS — Z961 Presence of intraocular lens: Secondary | ICD-10-CM | POA: Diagnosis not present

## 2021-02-19 DIAGNOSIS — H524 Presbyopia: Secondary | ICD-10-CM | POA: Diagnosis not present

## 2021-02-19 DIAGNOSIS — H52203 Unspecified astigmatism, bilateral: Secondary | ICD-10-CM | POA: Diagnosis not present

## 2021-03-04 DIAGNOSIS — M25511 Pain in right shoulder: Secondary | ICD-10-CM | POA: Diagnosis not present

## 2021-03-04 DIAGNOSIS — M19011 Primary osteoarthritis, right shoulder: Secondary | ICD-10-CM | POA: Diagnosis not present

## 2021-03-13 DIAGNOSIS — Z23 Encounter for immunization: Secondary | ICD-10-CM | POA: Diagnosis not present

## 2021-03-19 DIAGNOSIS — M25511 Pain in right shoulder: Secondary | ICD-10-CM | POA: Diagnosis not present

## 2021-03-19 DIAGNOSIS — M19011 Primary osteoarthritis, right shoulder: Secondary | ICD-10-CM | POA: Diagnosis not present

## 2021-03-30 DIAGNOSIS — M19011 Primary osteoarthritis, right shoulder: Secondary | ICD-10-CM | POA: Diagnosis not present

## 2021-03-30 DIAGNOSIS — M25511 Pain in right shoulder: Secondary | ICD-10-CM | POA: Diagnosis not present

## 2021-04-02 DIAGNOSIS — M19011 Primary osteoarthritis, right shoulder: Secondary | ICD-10-CM | POA: Diagnosis not present

## 2021-04-26 DIAGNOSIS — S46001A Unspecified injury of muscle(s) and tendon(s) of the rotator cuff of right shoulder, initial encounter: Secondary | ICD-10-CM | POA: Diagnosis not present

## 2021-04-26 DIAGNOSIS — M19011 Primary osteoarthritis, right shoulder: Secondary | ICD-10-CM | POA: Diagnosis not present

## 2021-05-09 DIAGNOSIS — M25511 Pain in right shoulder: Secondary | ICD-10-CM | POA: Diagnosis not present

## 2021-05-24 DIAGNOSIS — M75101 Unspecified rotator cuff tear or rupture of right shoulder, not specified as traumatic: Secondary | ICD-10-CM | POA: Diagnosis not present

## 2021-06-16 DIAGNOSIS — Z01818 Encounter for other preprocedural examination: Secondary | ICD-10-CM | POA: Diagnosis not present

## 2021-06-16 DIAGNOSIS — J4540 Moderate persistent asthma, uncomplicated: Secondary | ICD-10-CM | POA: Diagnosis not present

## 2021-06-16 DIAGNOSIS — M25511 Pain in right shoulder: Secondary | ICD-10-CM | POA: Diagnosis not present

## 2021-06-16 DIAGNOSIS — K219 Gastro-esophageal reflux disease without esophagitis: Secondary | ICD-10-CM | POA: Diagnosis not present

## 2021-06-21 ENCOUNTER — Other Ambulatory Visit: Payer: Self-pay

## 2021-06-21 MED ORDER — OMEPRAZOLE 40 MG PO CPDR: DELAYED_RELEASE_CAPSULE | ORAL | 0 refills | Status: DC

## 2021-07-02 ENCOUNTER — Telehealth: Payer: Self-pay | Admitting: Pulmonary Disease

## 2021-07-02 NOTE — Telephone Encounter (Signed)
Called and left message for someone to call office back so we can get the correct information to them and for them to give [place] a fax number.

## 2021-07-05 ENCOUNTER — Encounter: Payer: Self-pay | Admitting: Pulmonary Disease

## 2021-07-05 ENCOUNTER — Other Ambulatory Visit: Payer: Self-pay

## 2021-07-05 ENCOUNTER — Ambulatory Visit: Payer: PPO | Admitting: Pulmonary Disease

## 2021-07-05 VITALS — BP 114/62 | HR 65 | Temp 97.6°F | Ht 70.0 in | Wt 185.0 lb

## 2021-07-05 DIAGNOSIS — J4540 Moderate persistent asthma, uncomplicated: Secondary | ICD-10-CM | POA: Diagnosis not present

## 2021-07-05 MED ORDER — BUDESONIDE-FORMOTEROL FUMARATE 80-4.5 MCG/ACT IN AERO: 1.0000 | INHALATION_SPRAY | Freq: Two times a day (BID) | RESPIRATORY_TRACT | 6 refills | Status: DC

## 2021-07-05 MED ORDER — MONTELUKAST SODIUM 10 MG PO TABS: 10.0000 mg | ORAL_TABLET | Freq: Every day | ORAL | 1 refills | Status: DC

## 2021-07-05 NOTE — Progress Notes (Signed)
Erik Gomez    [id number]    [birth year]  Primary Care Physician:Gomez, Erik Lovett, MD  Referring Physician: Burnard Bunting, MD [address] [place],  [place] [address]  Chief complaint: Follow-up for asthma  HPI: 81 year old with history of asthma, GERD, vitamin D deficiency Maintained on Symbicort Follows with Dr. Fuller Plan for GERD, LPR.  Had a modified barium swallow on 09/15/2020 which did not show any evidence of aspiration  Pets: No pets Occupation: Retired Administrator Exposures: No mold, hot tub, Jacuzzi or asbestos exposure.  He has down pillows on the couch but hardly sits on it Smoking history: 12-pack-year smoker.  Quit in 1970 Travel history: No significant travel history Relevant family history: No significant family history of lung disease  Interval history: He continues on Symbicort 160.  Dose was reduced to 1 puff twice daily and he is doing well with the reduction Continues on Singulair  Is due for arthroscopic right shoulder surgery next week.  Outpatient Encounter Medications as of 07/05/2021  Medication Sig   Acetaminophen (TYLENOL PO) Per bottle as needed   Albuterol Sulfate (PROAIR RESPICLICK) 967 (90 Base) MCG/ACT AEPB Inhale 2 puffs into the lungs every 6 (six) hours as needed.   Biotin 1000 MCG tablet Take 1,000 mcg by mouth daily.   budesonide-formoterol (SYMBICORT) 160-4.5 MCG/ACT inhaler Inhale 2 puffs into the lungs in the morning and at bedtime. (Patient taking differently: Inhale 1 puff into the lungs in the morning and at bedtime.)   cetirizine (ZYRTEC) 10 MG tablet Take 10 mg by mouth daily at 2 PM.    montelukast (SINGULAIR) 10 MG tablet Take 1 tablet by mouth once daily   omeprazole (PRILOSEC) 40 MG capsule TAKE 1 CAPSULE BY MOUTH TWICE DAILY .   vitamin D, CHOLECALCIFEROL, 400 UNITS tablet Take 1,000 Units by mouth daily.   No facility-administered encounter medications on file as of 07/05/2021.   Physical Exam: Blood  pressure 114/62, pulse 65, temperature 97.6 F (36.4 C), temperature source Oral, height 5\' 10"  (1.778 m), weight 185 lb (83.9 kg), SpO2 97 %. Gen:      No acute distress HEENT:  EOMI, sclera anicteric Neck:     No masses; no thyromegaly Lungs:    Clear to auscultation bilaterally; normal respiratory effort CV:         Regular rate and rhythm; no murmurs Abd:      + bowel sounds; soft, non-tender; no palpable masses, no distension Ext:    No edema; adequate peripheral perfusion Skin:      Warm and dry; no rash Neuro: alert and oriented x 3 Psych: normal mood and affect   Data Reviewed: Imaging: Chest x-ray 11/28/2016-mild continue still look forward he got labs recently at his right base subsegmental atelectasis.  Borderline cardiomegaly.  Chest x-ray 05/18/2020- no active cardiopulmonary disease   Modified barium swallow 09/15/2020-no aspiration I have reviewed the images personally.  PFTs: 06/13/2019 FVC 3.24 [77%], FEV1 2.06 [68%], F/F 63, TLC 7.50 [96%], DLCO 25.17 [101%] Moderate obstructive airway disease with air trapping and bronchodilator response.  ACT score 07/05/2020- 25  Labs: CBC 10/02/2020-WBC 4.6, eos 2.2%, absolute eosinophil count 101 IgE 10/02/2020-527  Assessment:  Asthma Started on Symbicort in April 2022 with good control of symptoms Continues to have intermittent hoarseness of voice secondary to GERD, LPR  He is using Symbicort with spacer.  Reminded to rinse mouth and gargle after each use Since his symptoms are well controlled will do  further dose reduction to Symbicort 80.  Use 1 puff twice daily Continue Singulair  GERD, LPR Follows with [facility] Continue PPI  Discussed ENT evaluation for persistent hoarseness but he would like to hold off for now  Plan/Recommendations: Symbicort.  Reduce dose to 80.  1 puff twice daily Continue Singulair  Erik Garfinkel MD [facility] 07/05/2021, 1:35 PM  CC: Erik Bunting, MD

## 2021-07-05 NOTE — Patient Instructions (Signed)
Reduce Symbicort dose to 80.  Use 1 puff twice daily Continue Singulair We will send in renewal for these medications Follow-up in 6 months

## 2021-07-05 NOTE — Addendum Note (Signed)
Addended by: Elton Sin on: 07/05/2021 01:55 PM   Modules accepted: Orders

## 2021-07-06 NOTE — Telephone Encounter (Incomplete Revision)
Called [employer] and was placed on hold x 10 min  Will call back later

## 2021-07-06 NOTE — Telephone Encounter (Addendum)
Called [employer] and was placed on hold x 10 min  Will call back later

## 2021-07-07 NOTE — Telephone Encounter (Addendum)
Called [employer] and again placed on a long hold. When they call back need information on what exactly they need and a good fax number to send them what they need.

## 2021-07-07 NOTE — Telephone Encounter (Incomplete Revision)
Called [employer] and again placed on a long hold. When they call back need information on what exactly they need and a good fax number to send them what they need.

## 2021-07-08 DIAGNOSIS — M19011 Primary osteoarthritis, right shoulder: Secondary | ICD-10-CM | POA: Diagnosis not present

## 2021-07-08 DIAGNOSIS — Y999 Unspecified external cause status: Secondary | ICD-10-CM | POA: Diagnosis not present

## 2021-07-08 DIAGNOSIS — M75111 Incomplete rotator cuff tear or rupture of right shoulder, not specified as traumatic: Secondary | ICD-10-CM | POA: Diagnosis not present

## 2021-07-08 DIAGNOSIS — S46111A Strain of muscle, fascia and tendon of long head of biceps, right arm, initial encounter: Secondary | ICD-10-CM | POA: Diagnosis not present

## 2021-07-08 DIAGNOSIS — M7541 Impingement syndrome of right shoulder: Secondary | ICD-10-CM | POA: Diagnosis not present

## 2021-07-08 DIAGNOSIS — X58XXXA Exposure to other specified factors, initial encounter: Secondary | ICD-10-CM | POA: Diagnosis not present

## 2021-07-08 DIAGNOSIS — G8918 Other acute postprocedural pain: Secondary | ICD-10-CM | POA: Diagnosis not present

## 2021-07-12 ENCOUNTER — Telehealth: Payer: Self-pay | Admitting: Pharmacy Technician

## 2021-07-12 ENCOUNTER — Other Ambulatory Visit (HOSPITAL_COMMUNITY): Payer: Self-pay

## 2021-07-12 NOTE — Telephone Encounter (Signed)
Patient Advocate Encounter  Received notification from [employer] that prior authorization for SYMBICORT 160MCG is required.   PA NOT NEEDED on 2.6.23 Key [id number] Test claim submitted returned success. [facility] test claim is $40   [facility] will continue to follow  Luciano Cutter, CPhT Patient Advocate Phone: [phone] Fax:  [phone]

## 2021-07-13 NOTE — Telephone Encounter (Signed)
See phone encounter from 07/12/21. Symbicort does not need a PA.

## 2021-07-15 DIAGNOSIS — M25511 Pain in right shoulder: Secondary | ICD-10-CM | POA: Diagnosis not present

## 2021-07-21 DIAGNOSIS — Z4789 Encounter for other orthopedic aftercare: Secondary | ICD-10-CM | POA: Diagnosis not present

## 2021-07-22 DIAGNOSIS — M25511 Pain in right shoulder: Secondary | ICD-10-CM | POA: Diagnosis not present

## 2021-07-28 DIAGNOSIS — M25511 Pain in right shoulder: Secondary | ICD-10-CM | POA: Diagnosis not present

## 2021-08-04 DIAGNOSIS — M25511 Pain in right shoulder: Secondary | ICD-10-CM | POA: Diagnosis not present

## 2021-08-11 DIAGNOSIS — M25511 Pain in right shoulder: Secondary | ICD-10-CM | POA: Diagnosis not present

## 2021-08-12 DIAGNOSIS — L570 Actinic keratosis: Secondary | ICD-10-CM | POA: Diagnosis not present

## 2021-08-12 DIAGNOSIS — D225 Melanocytic nevi of trunk: Secondary | ICD-10-CM | POA: Diagnosis not present

## 2021-08-12 DIAGNOSIS — L814 Other melanin hyperpigmentation: Secondary | ICD-10-CM | POA: Diagnosis not present

## 2021-08-12 DIAGNOSIS — L821 Other seborrheic keratosis: Secondary | ICD-10-CM | POA: Diagnosis not present

## 2021-08-12 DIAGNOSIS — D2272 Melanocytic nevi of left lower limb, including hip: Secondary | ICD-10-CM | POA: Diagnosis not present

## 2021-08-12 DIAGNOSIS — Z85828 Personal history of other malignant neoplasm of skin: Secondary | ICD-10-CM | POA: Diagnosis not present

## 2021-08-16 ENCOUNTER — Telehealth: Payer: Self-pay | Admitting: Pulmonary Disease

## 2021-08-17 ENCOUNTER — Other Ambulatory Visit (HOSPITAL_COMMUNITY): Payer: Self-pay

## 2021-08-17 NOTE — Telephone Encounter (Signed)
Patient's spouse, patricia(DPR) is aware of below message and voiced her understanding.  ?Nothing further needed at this time.  ? ?

## 2021-08-17 NOTE — Telephone Encounter (Signed)
Test billing was submitted and returned a copay of $40. Will call the pharmacy for clarification and follow up with the patient (or wife).

## 2021-08-17 NOTE — Telephone Encounter (Signed)
Spoke to patient's spouse, Erik Gomez [facility]). ?Erik Gomez stated that Symbicort is not covered by insurance.  ? ?PA team, please advise. Thanks ?

## 2021-08-19 DIAGNOSIS — M25511 Pain in right shoulder: Secondary | ICD-10-CM | POA: Diagnosis not present

## 2021-08-25 DIAGNOSIS — M25511 Pain in right shoulder: Secondary | ICD-10-CM | POA: Diagnosis not present

## 2021-08-27 DIAGNOSIS — M25511 Pain in right shoulder: Secondary | ICD-10-CM | POA: Diagnosis not present

## 2021-09-01 DIAGNOSIS — M25511 Pain in right shoulder: Secondary | ICD-10-CM | POA: Diagnosis not present

## 2021-09-03 DIAGNOSIS — M25511 Pain in right shoulder: Secondary | ICD-10-CM | POA: Diagnosis not present

## 2021-09-08 DIAGNOSIS — M25511 Pain in right shoulder: Secondary | ICD-10-CM | POA: Diagnosis not present

## 2021-09-10 DIAGNOSIS — M25511 Pain in right shoulder: Secondary | ICD-10-CM | POA: Diagnosis not present

## 2021-09-15 DIAGNOSIS — M25511 Pain in right shoulder: Secondary | ICD-10-CM | POA: Diagnosis not present

## 2021-09-17 DIAGNOSIS — M25511 Pain in right shoulder: Secondary | ICD-10-CM | POA: Diagnosis not present

## 2021-09-20 DIAGNOSIS — E785 Hyperlipidemia, unspecified: Secondary | ICD-10-CM | POA: Diagnosis not present

## 2021-09-20 DIAGNOSIS — Z125 Encounter for screening for malignant neoplasm of prostate: Secondary | ICD-10-CM | POA: Diagnosis not present

## 2021-09-20 DIAGNOSIS — R946 Abnormal results of thyroid function studies: Secondary | ICD-10-CM | POA: Diagnosis not present

## 2021-09-24 DIAGNOSIS — M25511 Pain in right shoulder: Secondary | ICD-10-CM | POA: Diagnosis not present

## 2021-09-25 ENCOUNTER — Other Ambulatory Visit: Payer: Self-pay | Admitting: Gastroenterology

## 2021-09-27 ENCOUNTER — Telehealth: Payer: Self-pay | Admitting: Gastroenterology

## 2021-09-27 DIAGNOSIS — Z0000 Encounter for general adult medical examination without abnormal findings: Secondary | ICD-10-CM | POA: Diagnosis not present

## 2021-09-27 DIAGNOSIS — Z1339 Encounter for screening examination for other mental health and behavioral disorders: Secondary | ICD-10-CM | POA: Diagnosis not present

## 2021-09-27 DIAGNOSIS — I872 Venous insufficiency (chronic) (peripheral): Secondary | ICD-10-CM | POA: Diagnosis not present

## 2021-09-27 DIAGNOSIS — K219 Gastro-esophageal reflux disease without esophagitis: Secondary | ICD-10-CM | POA: Diagnosis not present

## 2021-09-27 DIAGNOSIS — E785 Hyperlipidemia, unspecified: Secondary | ICD-10-CM | POA: Diagnosis not present

## 2021-09-27 DIAGNOSIS — R82998 Other abnormal findings in urine: Secondary | ICD-10-CM | POA: Diagnosis not present

## 2021-09-27 DIAGNOSIS — Z1212 Encounter for screening for malignant neoplasm of rectum: Secondary | ICD-10-CM | POA: Diagnosis not present

## 2021-09-27 DIAGNOSIS — E663 Overweight: Secondary | ICD-10-CM | POA: Diagnosis not present

## 2021-09-27 DIAGNOSIS — Z1331 Encounter for screening for depression: Secondary | ICD-10-CM | POA: Diagnosis not present

## 2021-09-27 MED ORDER — OMEPRAZOLE 40 MG PO CPDR: DELAYED_RELEASE_CAPSULE | ORAL | 0 refills | Status: DC

## 2021-09-27 NOTE — Telephone Encounter (Signed)
Patient called requesting Prilosec refill. Per patient, pharmacy wouldn't refill and I advised we needed to have an OV in order to continue medication refills. Patient has been scheduled 10/07/21. Patient would like a 90 day supply. Please advise. ?

## 2021-09-27 NOTE — Telephone Encounter (Signed)
Prescription sent to patient's pharmacy until scheduled appt. 

## 2021-09-29 DIAGNOSIS — M25511 Pain in right shoulder: Secondary | ICD-10-CM | POA: Diagnosis not present

## 2021-10-05 DIAGNOSIS — M25511 Pain in right shoulder: Secondary | ICD-10-CM | POA: Diagnosis not present

## 2021-10-07 ENCOUNTER — Ambulatory Visit: Payer: PPO | Admitting: Gastroenterology

## 2021-10-12 DIAGNOSIS — M25511 Pain in right shoulder: Secondary | ICD-10-CM | POA: Diagnosis not present

## 2021-10-14 DIAGNOSIS — M25511 Pain in right shoulder: Secondary | ICD-10-CM | POA: Diagnosis not present

## 2021-10-19 DIAGNOSIS — M25511 Pain in right shoulder: Secondary | ICD-10-CM | POA: Diagnosis not present

## 2021-10-22 DIAGNOSIS — M25511 Pain in right shoulder: Secondary | ICD-10-CM | POA: Diagnosis not present

## 2021-11-10 ENCOUNTER — Encounter: Payer: Self-pay | Admitting: Gastroenterology

## 2021-11-10 ENCOUNTER — Ambulatory Visit: Payer: PPO | Admitting: Gastroenterology

## 2021-11-10 VITALS — BP 100/70 | HR 59 | Ht 69.0 in | Wt 184.0 lb

## 2021-11-10 DIAGNOSIS — Z1212 Encounter for screening for malignant neoplasm of rectum: Secondary | ICD-10-CM

## 2021-11-10 DIAGNOSIS — K219 Gastro-esophageal reflux disease without esophagitis: Secondary | ICD-10-CM

## 2021-11-10 DIAGNOSIS — Z1211 Encounter for screening for malignant neoplasm of colon: Secondary | ICD-10-CM | POA: Diagnosis not present

## 2021-11-10 DIAGNOSIS — Z4789 Encounter for other orthopedic aftercare: Secondary | ICD-10-CM | POA: Diagnosis not present

## 2021-11-10 MED ORDER — OMEPRAZOLE 40 MG PO CPDR: DELAYED_RELEASE_CAPSULE | ORAL | 3 refills | Status: DC

## 2021-11-10 NOTE — Progress Notes (Signed)
    Assessment     GERD with LPR Esophageal dysmotility  CRC screening, average risk   Recommendations    Continue omeprazole 40 mg p.o. twice daily and follow antireflux measures Trial of using a straw for thin liquids, avoiding cold liquids and making adjustments with foods and beverages appropriate to minimize his symptoms. It is reasonable for him to discontinue CRC screening and also reasonable for 1 more screening colonoscopy.  He will decide and let us know   HPI    This is a 81 year old male returning for follow-up of GERD with LPR.  He is accompanied by his wife.  He has intermittent coughing and choking when swallowing thin liquids.  His reflux symptoms are under good control.  He underwent barium esophagram and MBSS in April 2022 showing moderate esophageal dysmotility and swallowing that was felt to be within normal limits for his age.  We discussed considering colorectal cancer screening vs discontinuing.  His last colonoscopy was performed in September 2013 showing only internal hemorrhoids.  He has no history of colon polyps.   Labs / Imaging        View : No data to display.             Latest Ref Rng & Units 10/02/2020    3:27 PM  CBC  WBC 3.8 - 10.8 Thousand/uL 4.6    Hemoglobin 13.2 - 17.1 g/dL 14.5    Hematocrit 38.5 - 50.0 % 42.3    Platelets 140 - 400 Thousand/uL 199      Current Medications, Allergies, Past Medical History, Past Surgical History, Family History and Social History were reviewed in [employer] record.   Physical Exam: General: Well developed, well nourished, no acute distress Head: Normocephalic and atraumatic Eyes: Sclerae anicteric, EOMI Ears: Normal auditory acuity Mouth: Not examined Lungs: Clear throughout to auscultation Heart: Regular rate and rhythm; no murmurs, rubs or bruits Abdomen: Soft, non tender and non distended. No masses, hepatosplenomegaly or hernias noted. Normal Bowel sounds Rectal:  Not done Musculoskeletal: Symmetrical with no gross deformities  Pulses:  Normal pulses noted Extremities: No clubbing, cyanosis, edema or deformities noted Neurological: Alert oriented x 4, grossly nonfocal Psychological:  Alert and cooperative. Normal mood and affect   Erik Gomez Plan, MD 11/10/2021, 10:49 AM

## 2021-11-10 NOTE — Patient Instructions (Signed)
We have sent the following medications to your pharmacy for you to pick up at your convenience: omeprazole.  ? ?The [facility] providers would like to encourage you to use MYCHART to communicate with providers for non-urgent requests or questions.  Due to long hold times on the telephone, sending your provider a message by MYCHART may be a faster and more efficient way to get a response.  Please allow 48 business hours for a response.  Please remember that this is for non-urgent requests.  ? ?Thank you for choosing me and [facility]. ? ?Malcolm T. Stark, Jr., MD., FACG ? ? ?

## 2021-11-20 IMAGING — RF DG ESOPHAGUS
7 of 9 series · 12 of 18 positions shown · non-contrast
Comparison: Modified barium swallow same day

CLINICAL DATA: Dysphagia.  Coughing

EXAM:
ESOPHOGRAM / BARIUM SWALLOW / BARIUM TABLET STUDY
TECHNIQUE: Combined double contrast and single contrast examination performed
using effervescent crystals, thick barium liquid, and thin barium
liquid. The patient was observed with fluoroscopy swallowing a 13 mm
barium sulphate tablet.
FLUOROSCOPY TIME:  Fluoroscopy Time:  2 minutes 18 seconds
Radiation Exposure Index (if provided by the fluoroscopic device):
57.1 mGy
Number of Acquired Spot Images: 7

[Series 9: fluoro_barium 2fps_bw · 0.17mm/px · 1 of 2 frames shown (1 of 6)]
[frame 1/2]
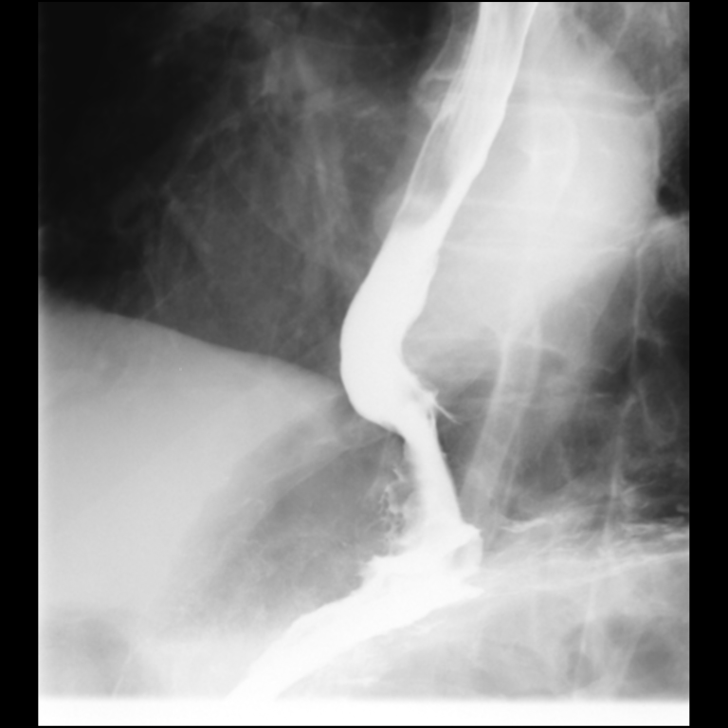

[Series 10: fluoro_barium 2fps_bw · 0.17mm/px · 2 of 2 frames shown (2 of 6)]
[frame 1/2]
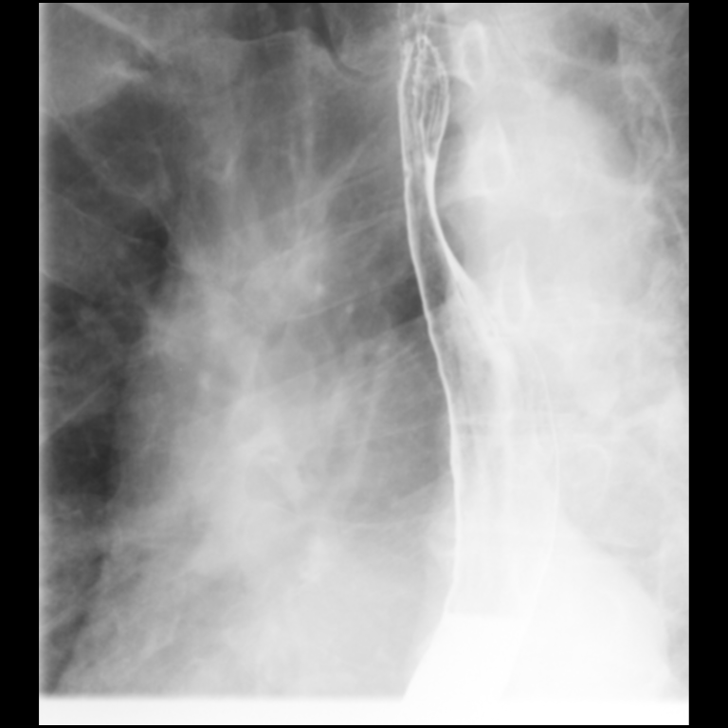
[frame 2/2]
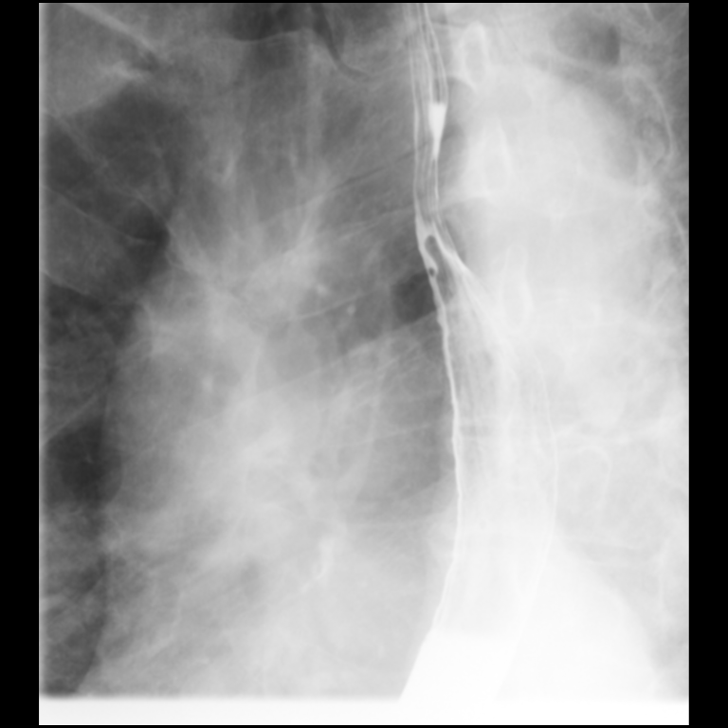

[Series 12: cp_standard · 0.35mm/px · 3 of 99 frames shown]
[frame 3/99]
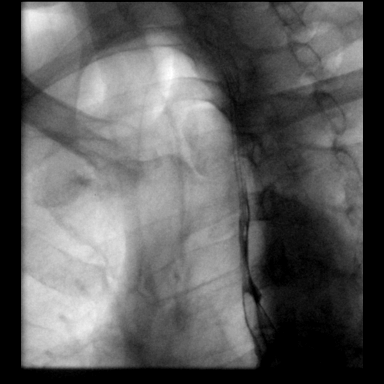
[frame 15/99]
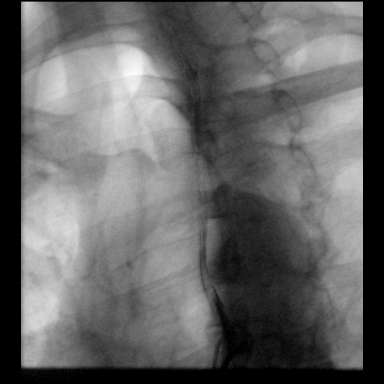
[frame 85/99]
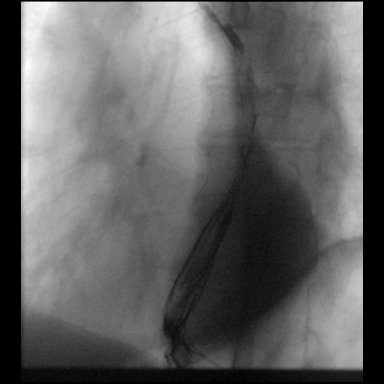

[Series 13: fluoro_barium 2fps_bw · 0.17mm/px · 1 of 2 frames shown (3 of 6)]
[frame 1/2]
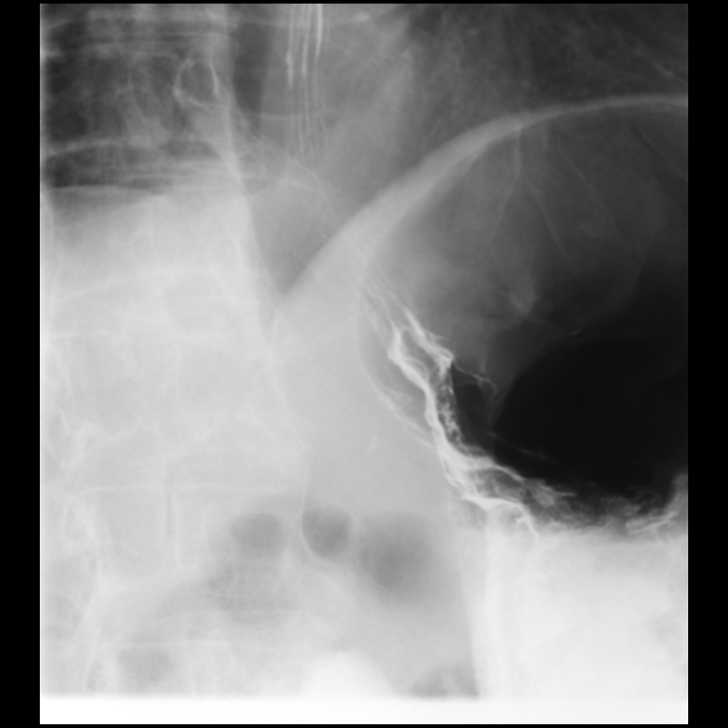

[Series 14: fluoro_barium 2fps_bw · 0.17mm/px · 2 of 2 frames shown (4 of 6)]
[frame 1/2]
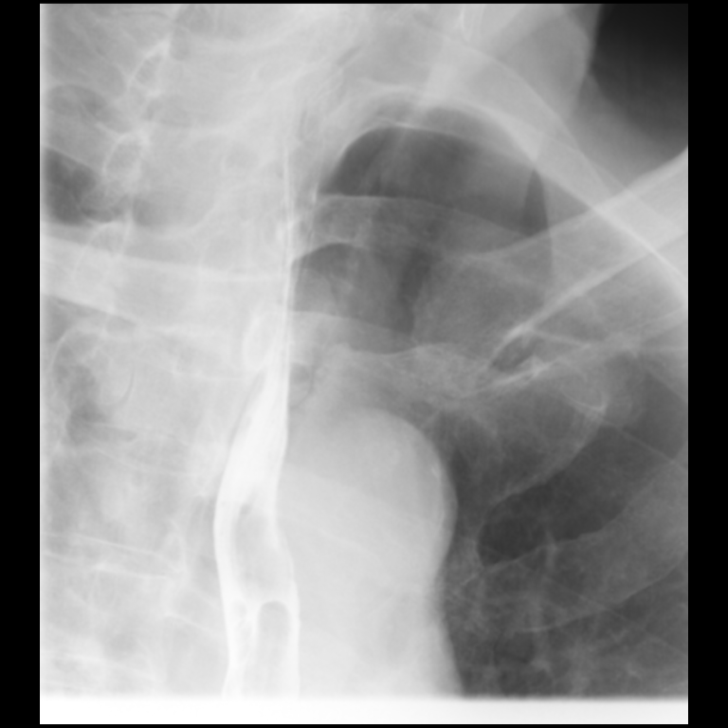
[frame 2/2]
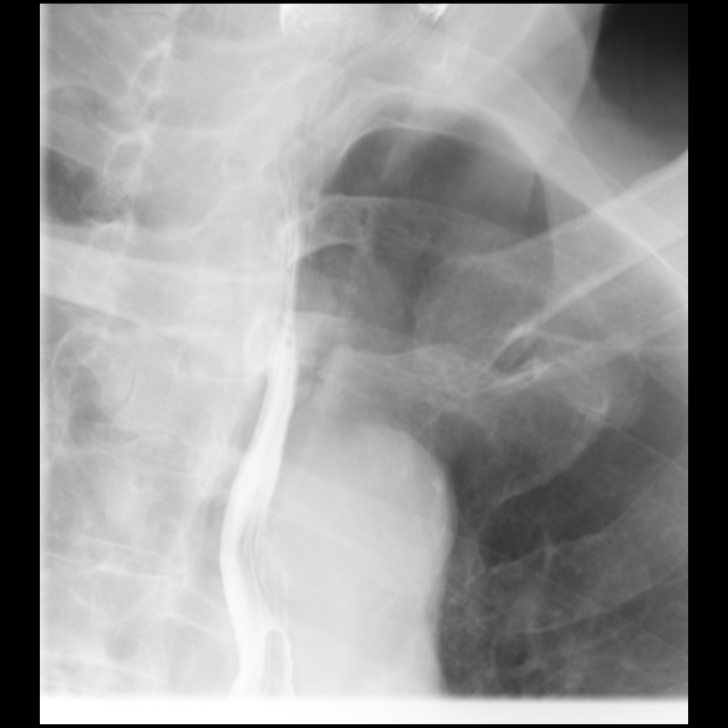

[Series 16: fluoro_barium 2fps_bw · 0.18mm/px · 2 of 2 frames shown (5 of 6)]
[frame 1/2]
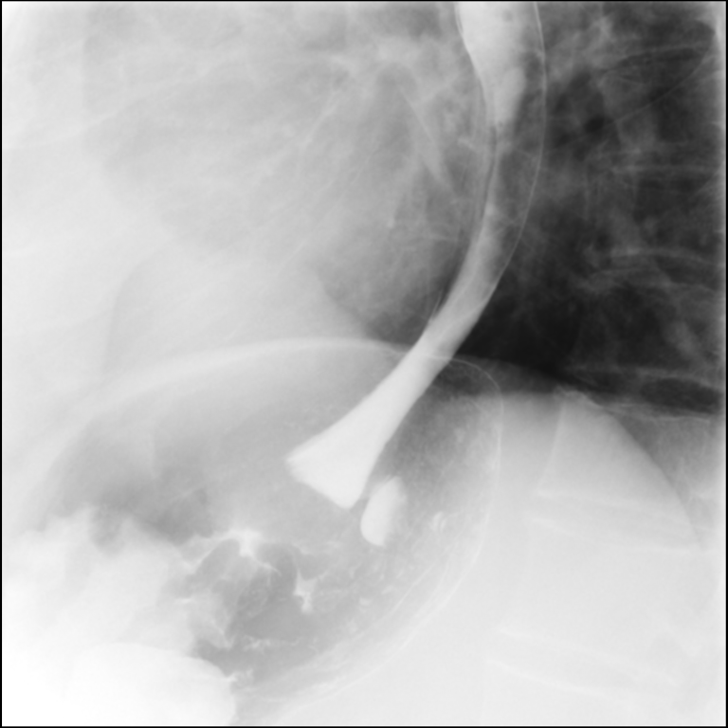
[frame 2/2]
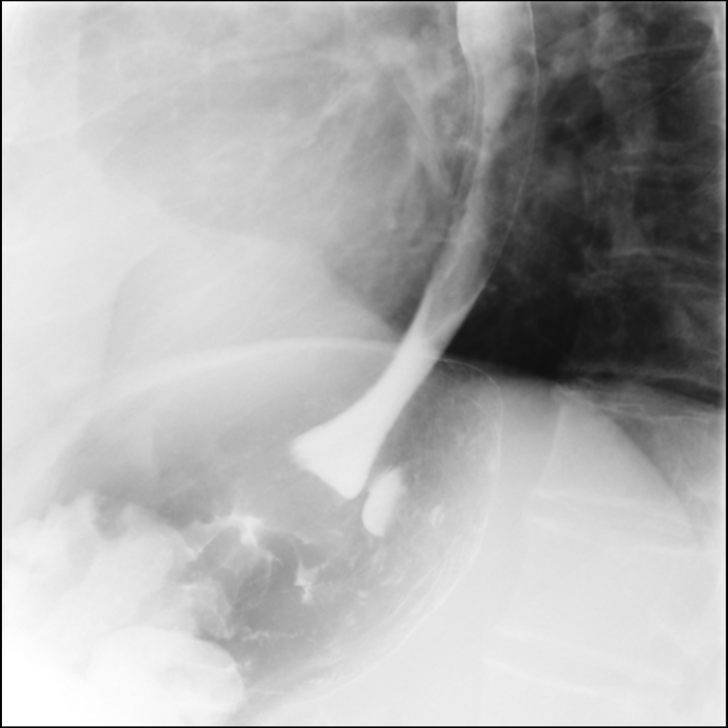

[Series 17: fluoro_barium 2fps_bw · 0.18mm/px · 1 of 2 frames shown (6 of 6)]
[frame 2/2]
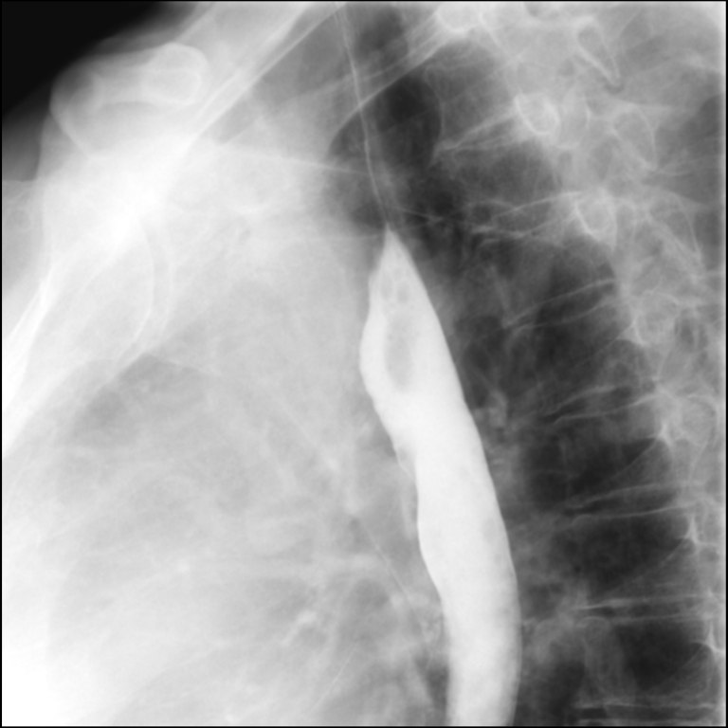

[12 of 18 positions shown; findings below may reference images not displayed]

FINDINGS: Normal swallowing function high cervical esophagus.

No mucosal irregularity stricture or mass within the thoracic
esophagus or distal esophagus.

With patient in prone position, esophageal motility was assessed.
There is poor initiation of the primary stripping wave. This
resulted in retention of barium within the esophagus with to and fro
motion of barium into the high cervical esophagus. No tertiary
contractions.

No hiatal hernia. No gastroesophageal reflux demonstrated in the
course exam.

Barium tablet passed GE junction easily.
IMPRESSION: 1. Poor initiation primary stripping wave and to and fro motion of
barium bolus in the high cervical esophagus. Mild-to-moderate
esophageal dysmotility favored presbyesophagus.
2. No mucosal irregularity stricture or mass identified in the
esophagus.
3. Barium tablet passed GE junction easily.

## 2021-12-02 ENCOUNTER — Telehealth: Payer: Self-pay | Admitting: Pulmonary Disease

## 2021-12-02 MED ORDER — PROAIR RESPICLICK 108 (90 BASE) MCG/ACT IN AEPB: 2.0000 | INHALATION_SPRAY | Freq: Four times a day (QID) | RESPIRATORY_TRACT | 3 refills | Status: DC | PRN

## 2021-12-02 NOTE — Telephone Encounter (Signed)
Called and spoke with Mardene Celeste. She stated that the patient needed a refill on his ProAir inhaler. The current one he has is expired. I advised her that I would go ahead and send in the RX. She verbalized understanding.   Nothing further needed at time of call.

## 2021-12-31 ENCOUNTER — Ambulatory Visit: Payer: PPO | Admitting: Pulmonary Disease

## 2021-12-31 ENCOUNTER — Encounter: Payer: Self-pay | Admitting: Pulmonary Disease

## 2021-12-31 VITALS — BP 112/60 | HR 57 | Temp 97.9°F | Ht 70.0 in | Wt 185.6 lb

## 2021-12-31 DIAGNOSIS — J4540 Moderate persistent asthma, uncomplicated: Secondary | ICD-10-CM | POA: Diagnosis not present

## 2021-12-31 NOTE — Progress Notes (Signed)
Erik Gomez    [id number]    [birth year]  Primary Care Physician:Aronson, Delfino Lovett, MD  Referring Physician: Burnard Bunting, MD [address] [place],  [place] [address]  Chief complaint: Follow-up for asthma  HPI: 81 year old with history of asthma, GERD, vitamin D deficiency Maintained on Symbicort Follows with Dr. Fuller Plan for GERD, LPR.  Had a modified barium swallow on 09/15/2020 which did not show any evidence of aspiration  Pets: No pets Occupation: Retired Administrator Exposures: No mold, hot tub, Jacuzzi or asbestos exposure.  He has down pillows on the couch but hardly sits on it Smoking history: 12-pack-year smoker.  Quit in 1970 Travel history: No significant travel history Relevant family history: No significant family history of lung disease  Interval history: Continues on Symbicort 39 which he is taking 1 puff twice daily Doing well with regard to his breathing.  No issues  Outpatient Encounter Medications as of 12/31/2021  Medication Sig   Acetaminophen (TYLENOL PO) Per bottle as needed   Albuterol Sulfate (PROAIR RESPICLICK) 841 (90 Base) MCG/ACT AEPB Inhale 2 puffs into the lungs every 6 (six) hours as needed.   Biotin 1000 MCG tablet Take 1,000 mcg by mouth daily.   budesonide-formoterol (SYMBICORT) 80-4.5 MCG/ACT inhaler Inhale 1 puff into the lungs in the morning and at bedtime.   cetirizine (ZYRTEC) 10 MG tablet Take 10 mg by mouth daily at 2 PM.    montelukast (SINGULAIR) 10 MG tablet Take 1 tablet (10 mg total) by mouth daily.   omeprazole (PRILOSEC) 40 MG capsule TAKE 1 CAPSULE BY MOUTH TWICE DAILY .   vitamin D, CHOLECALCIFEROL, 400 UNITS tablet Take 1,000 Units by mouth daily.   No facility-administered encounter medications on file as of 12/31/2021.   Physical Exam: Blood pressure 112/60, pulse (!) 57, temperature 97.9 F (36.6 C), temperature source Oral, height '5\' 10"'$  (1.778 m), weight 185 lb 9.6 oz (84.2 kg), SpO2 97 %. Gen:       No acute distress HEENT:  EOMI, sclera anicteric Neck:     No masses; no thyromegaly Lungs:    Clear to auscultation bilaterally; normal respiratory effort CV:         Regular rate and rhythm; no murmurs Abd:      + bowel sounds; soft, non-tender; no palpable masses, no distension Ext:    No edema; adequate peripheral perfusion Skin:      Warm and dry; no rash Neuro: alert and oriented x 3 Psych: normal mood and affect   Data Reviewed: Imaging: Chest x-ray 11/28/2016-mild continue still look forward he got labs recently at his right base subsegmental atelectasis.  Borderline cardiomegaly.  Chest x-ray 05/18/2020- no active cardiopulmonary disease   Modified barium swallow 09/15/2020-no aspiration I have reviewed the images personally.  PFTs: 06/13/2019 FVC 3.24 [77%], FEV1 2.06 [68%], F/F 63, TLC 7.50 [96%], DLCO 25.17 [101%] Moderate obstructive airway disease with air trapping and bronchodilator response.  ACT score  07/05/2020- 25 12/31/2021- 24  Labs: CBC 10/02/2020-WBC 4.6, eos 2.2%, absolute eosinophil count 101 IgE 10/02/2020-527  Assessment:  Asthma Started on Symbicort in April 2022 with good control of symptoms Continues to have intermittent hoarseness of voice secondary to GERD, LPR  He is using Symbicort with spacer.  Reminded to rinse mouth and gargle after each use Continue Singulair  GERD, LPR Follows with [facility] Continue PPI  Discussed ENT evaluation for persistent hoarseness but he would like to hold off for now  Plan/Recommendations: Symbicort. Continue Singulair  Marshell Garfinkel MD [facility] 12/31/2021, 2:47 PM  CC: Burnard Bunting, MD

## 2021-12-31 NOTE — Patient Instructions (Signed)
I am glad you are doing well with your breathing Continue Symbicort at current dose Follow-up in 1 year

## 2022-01-11 ENCOUNTER — Other Ambulatory Visit: Payer: Self-pay | Admitting: Pulmonary Disease

## 2022-02-01 DIAGNOSIS — R21 Rash and other nonspecific skin eruption: Secondary | ICD-10-CM | POA: Diagnosis not present

## 2022-02-01 DIAGNOSIS — B356 Tinea cruris: Secondary | ICD-10-CM | POA: Diagnosis not present

## 2022-02-28 DIAGNOSIS — L309 Dermatitis, unspecified: Secondary | ICD-10-CM | POA: Diagnosis not present

## 2022-02-28 DIAGNOSIS — Z85828 Personal history of other malignant neoplasm of skin: Secondary | ICD-10-CM | POA: Diagnosis not present

## 2022-03-19 DIAGNOSIS — Z23 Encounter for immunization: Secondary | ICD-10-CM | POA: Diagnosis not present

## 2022-03-22 ENCOUNTER — Telehealth: Payer: Self-pay | Admitting: Pulmonary Disease

## 2022-03-22 MED ORDER — AZITHROMYCIN 250 MG PO TABS: ORAL_TABLET | ORAL | 0 refills | Status: DC

## 2022-03-22 NOTE — Telephone Encounter (Signed)
Called and spoke with patient. He verbalized understanding. RX has been sent to the pharmacy.   Nothing further needed at time of call.

## 2022-03-22 NOTE — Telephone Encounter (Signed)
The patient reports that he is having some nasal congestion, headache, cough, and was negative for covid this morning. He is having some green stuff coming out of his nose. This has been going on for about 1 week. He feels that he may have a sinus infection. Please advise?

## 2022-03-22 NOTE — Telephone Encounter (Signed)
Please call in Z-Pak.

## 2022-03-22 NOTE — Telephone Encounter (Signed)
Pt also states he has been feeling fatigued.Please advise.  Pharmacy : [facility] [address] [place]

## 2022-04-06 DIAGNOSIS — M25552 Pain in left hip: Secondary | ICD-10-CM | POA: Diagnosis not present

## 2022-04-15 DIAGNOSIS — H35363 Drusen (degenerative) of macula, bilateral: Secondary | ICD-10-CM | POA: Diagnosis not present

## 2022-04-15 DIAGNOSIS — H52203 Unspecified astigmatism, bilateral: Secondary | ICD-10-CM | POA: Diagnosis not present

## 2022-04-15 DIAGNOSIS — H26491 Other secondary cataract, right eye: Secondary | ICD-10-CM | POA: Diagnosis not present

## 2022-04-15 DIAGNOSIS — H524 Presbyopia: Secondary | ICD-10-CM | POA: Diagnosis not present

## 2022-05-16 DIAGNOSIS — M25552 Pain in left hip: Secondary | ICD-10-CM | POA: Diagnosis not present

## 2022-06-02 DIAGNOSIS — M25552 Pain in left hip: Secondary | ICD-10-CM | POA: Diagnosis not present

## 2022-06-08 DIAGNOSIS — M25552 Pain in left hip: Secondary | ICD-10-CM | POA: Diagnosis not present

## 2022-06-23 DIAGNOSIS — M25552 Pain in left hip: Secondary | ICD-10-CM | POA: Diagnosis not present

## 2022-06-29 DIAGNOSIS — M25552 Pain in left hip: Secondary | ICD-10-CM | POA: Diagnosis not present

## 2022-06-30 ENCOUNTER — Telehealth: Payer: Self-pay | Admitting: Pulmonary Disease

## 2022-06-30 NOTE — Telephone Encounter (Signed)
Called and spoke to patient and he states that the Symbicort is too expensive and that for 2 months it is 100. He was stating that we need to call and get it taken back a tier.   Can we run a ticket to see what is covered and if the Symbicort needs to be generic or what.   Thank you

## 2022-07-01 ENCOUNTER — Other Ambulatory Visit (HOSPITAL_COMMUNITY): Payer: Self-pay

## 2022-07-01 NOTE — Telephone Encounter (Signed)
Sir,  When I called patient today he was stating that his Symbicort was costing him over $100. SO I sent a message to pharmacy and the claim showed:  If would like to stay with ICS+LABA, test claims show the following:    Name Bran Breo $47.00 Generic Advair Diskus $0.00  Please advise

## 2022-07-01 NOTE — Telephone Encounter (Signed)
If would like to stay with ICS+LABA, test claims show the following:   Name Bran Breo $47.00 Generic Advair Diskus $0.00

## 2022-07-04 DIAGNOSIS — M25552 Pain in left hip: Secondary | ICD-10-CM | POA: Diagnosis not present

## 2022-07-06 ENCOUNTER — Other Ambulatory Visit (HOSPITAL_COMMUNITY): Payer: Self-pay

## 2022-07-06 NOTE — Telephone Encounter (Signed)
Addendum:  PT states he had a terrible time on other inhalers and really want to keep the Symbicort brand.

## 2022-07-06 NOTE — Telephone Encounter (Signed)
I have not found any other inhalers tried, except for emergency inhaler albuterol.  Called and spoke with patient's wife.  Patient was not at home at this time.  Advised pharmacy team had found inhalers Breo and generic Advair that insurance would cover, but pharmacy team would need to know if patient had tried other inhalers in the past for any appeal with insurance. Patient's wife stated she would have patient call office back with inhalers he has used or if he would like to try inhaler approved by his insurance.

## 2022-07-06 NOTE — Telephone Encounter (Signed)
PT calling back. He can not remember what other inhalers he has used. He had a Dr. Abbe Amsterdam years ago/ He was off of Lawrence Santiago w/Dr. Fuller Plan. He was his Pulmonary Dr\. That Dr. Lu Duffel have records of previous usage. In short in the last 5years he has used only the Symbicort and Albuterol. PT is saying he will go to one puff a day. I told him no, to take as prescribed.

## 2022-07-06 NOTE — Telephone Encounter (Signed)
Called and spoke with patient's wife who stated patient does not want to change from Symbicort inhaler, because other inhalers do not work as good for him.  She stated they received a letter from patients insurance stating patient can be approved for Symbicort at a cheaper rate if Dr. Vaughan Browner will complete an appeal.   Patient is bringing letter for appeal to office this afternoon for pharmacy team to review and start appeal process.   Message routed to pharmacy team as [facility]

## 2022-07-06 NOTE — Telephone Encounter (Signed)
Routing this info to prior auth team.

## 2022-07-06 NOTE — Telephone Encounter (Signed)
What other inhalers has the patient tried, there is no notation in the chart other than experiencing thrush with inhaled steroids which Symbicort is.

## 2022-07-06 NOTE — Telephone Encounter (Signed)
PT wife calling. She got a letter saying she can get her husbands Symbicort for less money and she wonders if she needs to send [place] the letter or can this be done by phone.   Letter is from: [place] [phone] Joylene Igo)   Coverage Determination/Exception are some words on the letter.    [phone] is wife

## 2022-07-08 NOTE — Telephone Encounter (Signed)
Waiting for a response from prior auth team.

## 2022-07-08 NOTE — Telephone Encounter (Signed)
PT calling again. I let him know about the PA team calling him. He got another letter from the ins co he wanted [place] to know.

## 2022-07-08 NOTE — Telephone Encounter (Signed)
Is there any update with the PA for the Symbicort. Patient is calling asking for information.

## 2022-07-11 NOTE — Telephone Encounter (Signed)
Dr Vaughan Browner,  [facility] is calling us to see what else patient has tried other then Symbicort. A PA for Symbicort was requested last week for patient.   Sir please advise on what other inhalers patients have tried  Thank you

## 2022-07-12 MED ORDER — FLUTICASONE FUROATE-VILANTEROL 100-25 MCG/ACT IN AEPB: 1.0000 | INHALATION_SPRAY | Freq: Every day | RESPIRATORY_TRACT | 11 refills | Status: DC

## 2022-07-12 NOTE — Telephone Encounter (Signed)
Patient states Symbicort not covered by insurance. Patient phone number is [phone]. May leave detailed message on voicemail.

## 2022-07-12 NOTE — Telephone Encounter (Signed)
Spoke with the pt and notified of response per Dr Vaughan Browner  He verbalized understanding  Rx was sent

## 2022-07-12 NOTE — Telephone Encounter (Signed)
Looks like [facility] is covered by his insurance.  Please order Breo 100.

## 2022-07-13 DIAGNOSIS — M25552 Pain in left hip: Secondary | ICD-10-CM | POA: Diagnosis not present

## 2022-07-14 ENCOUNTER — Telehealth: Payer: Self-pay | Admitting: Pulmonary Disease

## 2022-07-15 ENCOUNTER — Other Ambulatory Visit (HOSPITAL_COMMUNITY): Payer: Self-pay

## 2022-07-19 DIAGNOSIS — M25552 Pain in left hip: Secondary | ICD-10-CM | POA: Diagnosis not present

## 2022-07-28 DIAGNOSIS — M25552 Pain in left hip: Secondary | ICD-10-CM | POA: Diagnosis not present

## 2022-08-02 DIAGNOSIS — M25552 Pain in left hip: Secondary | ICD-10-CM | POA: Diagnosis not present

## 2022-08-09 DIAGNOSIS — M25552 Pain in left hip: Secondary | ICD-10-CM | POA: Diagnosis not present

## 2022-08-16 ENCOUNTER — Other Ambulatory Visit: Payer: Self-pay | Admitting: Pulmonary Disease

## 2022-08-16 ENCOUNTER — Telehealth: Payer: Self-pay | Admitting: Pulmonary Disease

## 2022-08-16 DIAGNOSIS — M25552 Pain in left hip: Secondary | ICD-10-CM | POA: Diagnosis not present

## 2022-08-17 NOTE — Telephone Encounter (Signed)
Na

## 2022-08-18 DIAGNOSIS — Z85828 Personal history of other malignant neoplasm of skin: Secondary | ICD-10-CM | POA: Diagnosis not present

## 2022-08-18 DIAGNOSIS — L720 Epidermal cyst: Secondary | ICD-10-CM | POA: Diagnosis not present

## 2022-08-18 DIAGNOSIS — L821 Other seborrheic keratosis: Secondary | ICD-10-CM | POA: Diagnosis not present

## 2022-08-18 DIAGNOSIS — B356 Tinea cruris: Secondary | ICD-10-CM | POA: Diagnosis not present

## 2022-09-20 DIAGNOSIS — M25552 Pain in left hip: Secondary | ICD-10-CM | POA: Diagnosis not present

## 2022-09-21 DIAGNOSIS — M25552 Pain in left hip: Secondary | ICD-10-CM | POA: Diagnosis not present

## 2022-09-26 DIAGNOSIS — Z125 Encounter for screening for malignant neoplasm of prostate: Secondary | ICD-10-CM | POA: Diagnosis not present

## 2022-09-26 DIAGNOSIS — R946 Abnormal results of thyroid function studies: Secondary | ICD-10-CM | POA: Diagnosis not present

## 2022-09-26 DIAGNOSIS — E785 Hyperlipidemia, unspecified: Secondary | ICD-10-CM | POA: Diagnosis not present

## 2022-09-26 DIAGNOSIS — R7989 Other specified abnormal findings of blood chemistry: Secondary | ICD-10-CM | POA: Diagnosis not present

## 2022-09-26 DIAGNOSIS — K219 Gastro-esophageal reflux disease without esophagitis: Secondary | ICD-10-CM | POA: Diagnosis not present

## 2022-10-04 DIAGNOSIS — H0100A Unspecified blepharitis right eye, upper and lower eyelids: Secondary | ICD-10-CM | POA: Diagnosis not present

## 2022-10-04 DIAGNOSIS — H0100B Unspecified blepharitis left eye, upper and lower eyelids: Secondary | ICD-10-CM | POA: Diagnosis not present

## 2022-10-04 DIAGNOSIS — H00015 Hordeolum externum left lower eyelid: Secondary | ICD-10-CM | POA: Diagnosis not present

## 2022-10-05 DIAGNOSIS — N401 Enlarged prostate with lower urinary tract symptoms: Secondary | ICD-10-CM | POA: Diagnosis not present

## 2022-10-05 DIAGNOSIS — Z0000 Encounter for general adult medical examination without abnormal findings: Secondary | ICD-10-CM | POA: Diagnosis not present

## 2022-10-05 DIAGNOSIS — Z1331 Encounter for screening for depression: Secondary | ICD-10-CM | POA: Diagnosis not present

## 2022-10-05 DIAGNOSIS — E785 Hyperlipidemia, unspecified: Secondary | ICD-10-CM | POA: Diagnosis not present

## 2022-10-05 DIAGNOSIS — R82998 Other abnormal findings in urine: Secondary | ICD-10-CM | POA: Diagnosis not present

## 2022-10-05 DIAGNOSIS — E663 Overweight: Secondary | ICD-10-CM | POA: Diagnosis not present

## 2022-10-05 DIAGNOSIS — I872 Venous insufficiency (chronic) (peripheral): Secondary | ICD-10-CM | POA: Diagnosis not present

## 2022-10-05 DIAGNOSIS — Z1339 Encounter for screening examination for other mental health and behavioral disorders: Secondary | ICD-10-CM | POA: Diagnosis not present

## 2022-11-03 DIAGNOSIS — M5451 Vertebrogenic low back pain: Secondary | ICD-10-CM | POA: Diagnosis not present

## 2022-11-03 DIAGNOSIS — M25552 Pain in left hip: Secondary | ICD-10-CM | POA: Diagnosis not present

## 2022-11-08 DIAGNOSIS — M5451 Vertebrogenic low back pain: Secondary | ICD-10-CM | POA: Diagnosis not present

## 2022-11-24 ENCOUNTER — Encounter: Payer: Self-pay | Admitting: Gastroenterology

## 2022-11-24 ENCOUNTER — Ambulatory Visit: Payer: PPO | Admitting: Gastroenterology

## 2022-11-24 VITALS — BP 120/70 | HR 60 | Ht 70.0 in | Wt 187.0 lb

## 2022-11-24 DIAGNOSIS — K219 Gastro-esophageal reflux disease without esophagitis: Secondary | ICD-10-CM | POA: Diagnosis not present

## 2022-11-24 MED ORDER — OMEPRAZOLE 40 MG PO CPDR: DELAYED_RELEASE_CAPSULE | ORAL | 3 refills | Status: DC

## 2022-11-24 NOTE — Progress Notes (Signed)
Assessment     GERD with LPR Esophageal dysmotility  Pharyngeal phase dysphagia per MBSS CRC screening, average risk   Recommendations    Change omeprazole to 40 mg daily.  If reflux or LPR symptoms return resume 40 mg twice daily. Follow antireflux measures Gaviscon tid prn following meals Use a straw for beverages as recommended by speech pathology It is reasonable for him to discontinue CRC screening and also reasonable for 1 more screening colonoscopy.  He will decide and let us know   HPI    This is an 82 year old male with GERD and LPR returning for follow-up.  He is accompanied by his wife.  He relates frequent belching after meals.  Gas-X was not helpful.  He would wonders whether he can reduce omeprazole to daily dosing.   Labs / Imaging        No data to display             Latest Ref Rng & Units 10/02/2020    3:27 PM  CBC  WBC 3.8 - 10.8 Thousand/uL 4.6   Hemoglobin 13.2 - 17.1 g/dL 96.2   Hematocrit 95.2 - 50.0 % 42.3   Platelets 140 - 400 Thousand/uL 199      DG SWALLOW FUNC OP MEDICARE SPEECH PATH Objective Swallowing Evaluation: Type of Study: MBS-Modified Barium  Swallow Study   Patient Details  Name: Erik Gomez MRN: [id number] Date of Birth: 01/31/1941  Today's Date: 09/15/2020 Time: SLP Start Time (ACUTE ONLY): 1016 -SLP Stop Time (ACUTE ONLY): 1037  SLP Time Calculation (min) (ACUTE ONLY): 21 min  Past Medical History:  Past Medical History:  Diagnosis Date   Arthritis    Asthma, intrinsic    Cataract    per pt "beginning of a cataract"   Chronic headache    GERD (gastroesophageal reflux disease)    w/ LPR   Hiatal hernia    Internal hemorrhoid    Peyronie disease    Vitamin D deficiency    Past Surgical History:  Past Surgical History:  Procedure Laterality Date   CATARACT EXTRACTION, BILATERAL Bilateral 2018   COLONOSCOPY     FINGER SURGERY     middle finger of right hand   KNEE SURGERY     Left   Peyronie's  Disease surgery  2001   SKIN CANCER EXCISION     Right leg   HPI: 82 year old male referred by Dr Russella Gomez for OP MBS to be followed by  esophagram. Per Dr Ardell Isaacs notes, pt has h/o GERD and LPR.  Pt reported to  MD more frequent intermittent problems with choking and coughing when  swallowing liquids.  He states this may not occur for days - nothing  exacerbates or improves it. Pt has prior MBSS evaluation 2011 without full  note but C4-C5 and C5-C6 osteophytes were noted.  Small single sips and  double swallows every 2-3 sips was recommended.  Pt denies weight loss,  requiring heimlich manuever nor recent pna.  Subjectively his voice sounds  hoarse with frequent breaks - he has not seen an ENT.  Has h/o smoking  heavily for approximately 7 years (stopped 54 years ago).  Pt also reports  allergies, arthritis, and per CXRs has h/o asthma and sinusitis- o/w no  consistent hx reported.Pt reports dysphagia for years but admits it has  worsened recently.    Of note, SLP advised pt to consider seeing an ENT given voice changes and  remote h/o smoking.  Subjective: pt awake in chair  Assessment / Plan / Recommendation  CHL IP CLINICAL IMPRESSIONS 09/15/2020  Clinical Impression Patient presents with a normal oropharyngeal swallow  ability for his age without aspiration or frank penetration of any  consistency tested.  Swallow was timely; trace flash penetration of thin  liquid observed.  Observed patient with small boluses and sequential  swallows of thin * x10.  Mild vallecular retention observed with  puree/cracker more than with thin liquid *approximately 20% of cracker  bolus*.  Pt sensed retention evidenced by pharynx being clear upon return  to flouroscopy *he conducted independent dry swallow.  SLP recommends  continue regular/thin diet with general aspiration precautions.  Thanks  for this referral.  SLP Visit Diagnosis Dysphagia, pharyngeal phase (R13.13)  Attention and  concentration deficit following --  Frontal lobe and executive function deficit following --  Impact on safety and function No limitations      No flowsheet data found.   No flowsheet data found.  CHL IP DIET RECOMMENDATION 09/15/2020  SLP Diet Recommendations Regular solids;Thin liquid  Liquid Administration via Straw;Cup  Medication Administration Whole meds with liquid  Compensations --  Postural Changes Remain semi-upright after after feeds/meals  (Comment);Seated upright at 90 degrees      No flowsheet data found.    No flowsheet data found.    No flowsheet data found.         CHL IP ORAL PHASE 09/15/2020  Oral Phase WFL  Oral - Pudding Teaspoon --  Oral - Pudding Cup --  Oral - Honey Teaspoon --  Oral - Honey Cup --  Oral - Nectar Teaspoon --  Oral - Nectar Cup --  Oral - Nectar Straw --  Oral - Thin Teaspoon --  Oral - Thin Cup WFL;Piecemeal swallowing  Oral - Thin Straw WFL;Piecemeal swallowing  Oral - Puree WFL;Piecemeal swallowing  Oral - Mech Soft WFL;Piecemeal swallowing  Oral - Regular NT  Oral - Multi-Consistency --  Oral - Pill --  Oral Phase - Comment --    CHL IP PHARYNGEAL PHASE 09/15/2020  Pharyngeal Phase Impaired  Pharyngeal- Pudding Teaspoon --  Pharyngeal --  Pharyngeal- Pudding Cup --  Pharyngeal --  Pharyngeal- Honey Teaspoon --  Pharyngeal --  Pharyngeal- Honey Cup --  Pharyngeal --  Pharyngeal- Nectar Teaspoon --  Pharyngeal --  Pharyngeal- Nectar Cup --  Pharyngeal --  Pharyngeal- Nectar Straw --  Pharyngeal --  Pharyngeal- Thin Teaspoon --  Pharyngeal --  Pharyngeal- Thin Cup WFL;Penetration/Aspiration during swallow  Pharyngeal Material enters airway, remains ABOVE vocal cords then ejected  out  Pharyngeal- Thin Straw Penetration/Aspiration during swallow  Pharyngeal Material enters airway, remains ABOVE vocal cords then ejected  out  Pharyngeal- Puree WFL;Pharyngeal residue - valleculae  Pharyngeal Material does not  enter airway  Pharyngeal- Mechanical Soft WFL;Pharyngeal residue - valleculae;Reduced  laryngeal elevation  Pharyngeal Material does not enter airway  Pharyngeal- Regular --  Pharyngeal --  Pharyngeal- Multi-consistency --  Pharyngeal --  Pharyngeal- Pill --  Pharyngeal --  Pharyngeal Comment approx 20% of barium retention at vallecular region  noted with cracker after swallow, pt sensed and swallowed to clear *when  flouro was turned off*     CHL IP CERVICAL ESOPHAGEAL PHASE 09/15/2020  Cervical Esophageal Phase WFL  Pudding Teaspoon --  Pudding Cup --  Honey Teaspoon --  Honey Cup --  Nectar Teaspoon --  Nectar Cup --  Nectar Straw --  Thin Teaspoon --  Thin Cup --  Thin Straw --  Puree --  Mechanical Soft --  Regular --  Multi-consistency --  Pill --  Cervical Esophageal Comment --   Rolena Infante, MS [facility] SLP Acute Rehab Services Office [phone] Pager [phone]  Chales Abrahams 09/15/2020, 11:27 AM    CLINICAL DATA:  Dysphagia. Cough/GE reflux disease/other secondary diagnosis  EXAM: MODIFIED BARIUM SWALLOW  TECHNIQUE: Different consistencies of barium were administered orally to the patient by the Speech Pathologist. Imaging of the pharynx was performed in the lateral projection. The radiologist was present in the fluoroscopy room for this study, providing personal supervision.  FLUOROSCOPY TIME:  Fluoroscopy Time:  54 seconds  Radiation Exposure Index (if provided by the fluoroscopic device): 2.5 mGy  Number of Acquired Spot Images: 0  COMPARISON:  None.  FINDINGS: No tracheal penetration or laryngeal aspiration. Mild residual retention of cracker at the level of the epiglottis which cleared with fluid  IMPRESSION: No aspiration or penetration.  Please refer to the Speech Pathologists report for complete details and recommendations.  Electronically Signed   By: Genevive Bi M.D.   On: 09/15/2020 11:18 DG ESOPHAGUS W DOUBLE CM  (HD) CLINICAL DATA:  Dysphagia.  Coughing  EXAM: ESOPHOGRAM / BARIUM SWALLOW / BARIUM TABLET STUDY  TECHNIQUE: Combined double contrast and single contrast examination performed using effervescent crystals, thick barium liquid, and thin barium liquid. The patient was observed with fluoroscopy swallowing a 13 mm barium sulphate tablet.  FLUOROSCOPY TIME:  Fluoroscopy Time:  2 minutes 18 seconds  Radiation Exposure Index (if provided by the fluoroscopic device): 57.1 mGy  Number of Acquired Spot Images: 7  COMPARISON:  Modified barium swallow same day  FINDINGS: Normal swallowing function high cervical esophagus.  No mucosal irregularity stricture or mass within the thoracic esophagus or distal esophagus.  With patient in prone position, esophageal motility was assessed. There is poor initiation of the primary stripping wave. This resulted in retention of barium within the esophagus with to and fro motion of barium into the high cervical esophagus. No tertiary contractions.  No hiatal hernia. No gastroesophageal reflux demonstrated in the course exam.  Barium tablet passed GE junction easily.  IMPRESSION: 1. Poor initiation primary stripping wave and to and fro motion of barium bolus in the high cervical esophagus. Mild-to-moderate esophageal dysmotility favored presbyesophagus. 2. No mucosal irregularity stricture or mass identified in the esophagus. 3. Barium tablet passed GE junction easily.  Electronically Signed   By: Genevive Bi M.D.   On: 09/15/2020 11:23   Current Medications, Allergies, Past Medical History, Past Surgical History, Family History and Social History were reviewed in [employer] record.   Physical Exam: General: Well developed, well nourished, no acute distress Head: Normocephalic and atraumatic Eyes: Sclerae anicteric, EOMI Ears: Normal auditory acuity Mouth: No deformities or lesions noted Lungs: Clear  throughout to auscultation Heart: Regular rate and rhythm; No murmurs, rubs or bruits Abdomen: Soft, non tender and non distended. No masses, hepatosplenomegaly or hernias noted. Normal Bowel sounds Rectal: Not done Musculoskeletal: Symmetrical with no gross deformities  Pulses:  Normal pulses noted Extremities: No edema or deformities noted Neurological: Alert oriented x 4, grossly nonfocal Psychological:  Alert and cooperative. Normal mood and affect   Erik Marsalis T. Russella Dar, MD 11/24/2022, 10:07 AM

## 2022-11-24 NOTE — Patient Instructions (Signed)
We have sent the following medications to your pharmacy for you to pick up at your convenience: omeprazole.  ? ?The [facility] providers would like to encourage you to use MYCHART to communicate with providers for non-urgent requests or questions.  Due to long hold times on the telephone, sending your provider a message by MYCHART may be a faster and more efficient way to get a response.  Please allow 48 business hours for a response.  Please remember that this is for non-urgent requests.  ? ?Thank you for choosing me and [facility]. ? ?Malcolm T. Stark, Jr., MD., FACG ? ? ?

## 2022-12-12 ENCOUNTER — Encounter: Payer: Self-pay | Admitting: Pulmonary Disease

## 2022-12-12 ENCOUNTER — Ambulatory Visit: Payer: PPO | Admitting: Pulmonary Disease

## 2022-12-12 VITALS — BP 110/66 | HR 57 | Temp 97.9°F | Ht 70.0 in | Wt 186.8 lb

## 2022-12-12 DIAGNOSIS — M533 Sacrococcygeal disorders, not elsewhere classified: Secondary | ICD-10-CM | POA: Diagnosis not present

## 2022-12-12 DIAGNOSIS — J4540 Moderate persistent asthma, uncomplicated: Secondary | ICD-10-CM | POA: Diagnosis not present

## 2022-12-12 NOTE — Progress Notes (Signed)
Erik Gomez    [id number]    1941-04-03  Primary Care Physician:Aronson, Gerlene Burdock, MD  Referring Physician: Geoffry Paradise, MD [address] [place],  [place] [address]  Chief complaint: Follow-up for asthma  HPI: 82 year old with history of asthma, GERD, vitamin D deficiency Maintained on Symbicort Follows with Dr. Russella Dar for GERD, LPR.  Had a modified barium swallow on 09/15/2020 which did not show any evidence of aspiration  Pets: No pets Occupation: Retired Naval architect Exposures: No mold, hot tub, Jacuzzi or asbestos exposure.  He has down pillows on the couch but hardly sits on it Smoking history: 12-pack-year smoker.  Quit in 1970 Travel history: No significant travel history Relevant family history: No significant family history of lung disease  Interval history: Continues on Symbicort 3 which he is taking 1 puff twice daily Doing well with regard to his breathing.  No issues States that price of Symbicort went up and he wants a cheaper alternative  Outpatient Encounter Medications as of 12/31/2021  Medication Sig   Acetaminophen (TYLENOL PO) Per bottle as needed   Albuterol Sulfate (PROAIR RESPICLICK) 108 (90 Base) MCG/ACT AEPB Inhale 2 puffs into the lungs every 6 (six) hours as needed.   Biotin 1000 MCG tablet Take 1,000 mcg by mouth daily.   budesonide-formoterol (SYMBICORT) 80-4.5 MCG/ACT inhaler Inhale 1 puff into the lungs in the morning and at bedtime.   cetirizine (ZYRTEC) 10 MG tablet Take 10 mg by mouth daily at 2 PM.    montelukast (SINGULAIR) 10 MG tablet Take 1 tablet (10 mg total) by mouth daily.   omeprazole (PRILOSEC) 40 MG capsule TAKE 1 CAPSULE BY MOUTH TWICE DAILY .   vitamin D, CHOLECALCIFEROL, 400 UNITS tablet Take 1,000 Units by mouth daily.   No facility-administered encounter medications on file as of 12/31/2021.   Physical Exam: Blood pressure 112/60, pulse (!) 57, temperature 97.9 F (36.6 C), temperature source Oral,  height 5\' 10"  (1.778 m), weight 185 lb 9.6 oz (84.2 kg), SpO2 97 %. Gen:      No acute distress HEENT:  EOMI, sclera anicteric Neck:     No masses; no thyromegaly Lungs:    Clear to auscultation bilaterally; normal respiratory effort CV:         Regular rate and rhythm; no murmurs Abd:      + bowel sounds; soft, non-tender; no palpable masses, no distension Ext:    No edema; adequate peripheral perfusion Skin:      Warm and dry; no rash Neuro: alert and oriented x 3 Psych: normal mood and affect   Data Reviewed: Imaging: Chest x-ray 11/28/2016-mild continue still look forward he got labs recently at his right base subsegmental atelectasis.  Borderline cardiomegaly.  Chest x-ray 05/18/2020- no active cardiopulmonary disease   Modified barium swallow 09/15/2020-no aspiration I have reviewed the images personally.  PFTs: 06/13/2019 FVC 3.24 [77%], FEV1 2.06 [68%], F/F 63, TLC 7.50 [96%], DLCO 25.17 [101%] Moderate obstructive airway disease with air trapping and bronchodilator response.  ACT score  07/05/2020- 25 12/31/2021- 24  Labs: CBC 10/02/2020-WBC 4.6, eos 2.2%, absolute eosinophil count 101 IgE 10/02/2020-527  Assessment:  Asthma Started on Symbicort in April 2022 with good control of symptoms Will check with pharmacy see if there are any cheaper alternatives to Symbicort as it is too expensive now. Continues to have intermittent hoarseness of voice secondary to GERD, LPR  He is using Symbicort with spacer.  Reminded to rinse mouth and  gargle after each use Continue Singulair  GERD, LPR Follows with [facility] Continue PPI  Discussed ENT evaluation for persistent hoarseness but he would like to hold off for now  Plan/Recommendations: Symbicort. Continue Singulair  Chilton Greathouse MD [facility] 12/31/2021, 2:47 PM  CC: Geoffry Paradise, MD

## 2022-12-12 NOTE — Patient Instructions (Signed)
I am glad everything is doing well I will check with the pharmacy team to see if there are cheaper alternatives to Symbicort Follow-up in 6 months

## 2022-12-13 ENCOUNTER — Other Ambulatory Visit (HOSPITAL_COMMUNITY): Payer: Self-pay

## 2022-12-13 NOTE — Progress Notes (Signed)
Generic Adviar Diskus/Wixela is showing a $0.00 co-pay per test clams at this time.

## 2022-12-14 ENCOUNTER — Other Ambulatory Visit: Payer: Self-pay | Admitting: *Deleted

## 2022-12-14 DIAGNOSIS — M533 Sacrococcygeal disorders, not elsewhere classified: Secondary | ICD-10-CM | POA: Diagnosis not present

## 2022-12-14 MED ORDER — FLUTICASONE-SALMETEROL 250-50 MCG/ACT IN AEPB: 1.0000 | INHALATION_SPRAY | Freq: Two times a day (BID) | RESPIRATORY_TRACT | 5 refills | Status: DC

## 2022-12-28 DIAGNOSIS — M5459 Other low back pain: Secondary | ICD-10-CM | POA: Diagnosis not present

## 2023-01-05 ENCOUNTER — Telehealth: Payer: Self-pay | Admitting: Pulmonary Disease

## 2023-01-05 NOTE — Telephone Encounter (Signed)
I called and spoke with patient. He is going to go back on the symbicort and has the old prescription. Nothing further needed

## 2023-01-08 DIAGNOSIS — M5459 Other low back pain: Secondary | ICD-10-CM | POA: Diagnosis not present

## 2023-01-11 DIAGNOSIS — M5416 Radiculopathy, lumbar region: Secondary | ICD-10-CM | POA: Diagnosis not present

## 2023-01-14 ENCOUNTER — Telehealth: Payer: Self-pay | Admitting: Critical Care Medicine

## 2023-01-14 DIAGNOSIS — U071 COVID-19: Secondary | ICD-10-CM

## 2023-01-14 MED ORDER — MOLNUPIRAVIR 200 MG PO CAPS: 4.0000 | ORAL_CAPSULE | Freq: Two times a day (BID) | ORAL | 0 refills | Status: AC

## 2023-01-14 NOTE — Telephone Encounter (Signed)
Erik Gomez tested positive for covid on 8/7, still positive on 8/9 per home tests. Now has cold symptoms, fatigue. His wife wonders if he should take an antiviral for covid. We discussed with his history of asthma he is at increased risk of pulmonary complications and is a candidate for covid therapy. He is ok to continue his other medications-- med list reviewed, on PPI & Symbicort.   On antibiotic for eyes -- drops- ok to con't the oxacillin drops.   Molnupiravir sent to [facility] in [place].  Discussed indications for follow up in pulm clinic or ED if he develops pulmonary symptoms, which he does not currently have.   Steffanie Dunn, DO 01/14/23 11:15 AM [facility]

## 2023-01-15 ENCOUNTER — Other Ambulatory Visit: Payer: Self-pay | Admitting: Pulmonary Disease

## 2023-01-17 ENCOUNTER — Telehealth: Payer: Self-pay | Admitting: Pulmonary Disease

## 2023-01-17 NOTE — Telephone Encounter (Signed)
Erik Gomez states patient needs refill for Montelukast. Also Symbicort is not covered by insurance. Insurance will cover Advair, [name] and [name]. Pharmacy is [name]. Phone number is [phone].

## 2023-01-20 ENCOUNTER — Telehealth: Payer: Self-pay | Admitting: Pulmonary Disease

## 2023-01-20 MED ORDER — MONTELUKAST SODIUM 10 MG PO TABS: 10.0000 mg | ORAL_TABLET | Freq: Every day | ORAL | 3 refills | Status: DC

## 2023-01-20 NOTE — Telephone Encounter (Signed)
I called and spoke with the patient Prescription for singulair has been sent He prefers to keep his symbicort for now and will discuss alternate inhalers at return clinic visit  Nothing further needed.

## 2023-01-20 NOTE — Telephone Encounter (Signed)
PT reached out yesterday. 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 He only has 3 pills remaining.

## 2023-01-31 DIAGNOSIS — M5416 Radiculopathy, lumbar region: Secondary | ICD-10-CM | POA: Diagnosis not present

## 2023-02-03 ENCOUNTER — Other Ambulatory Visit: Payer: Self-pay

## 2023-02-03 ENCOUNTER — Emergency Department (HOSPITAL_COMMUNITY): Payer: PPO

## 2023-02-03 ENCOUNTER — Encounter (HOSPITAL_COMMUNITY): Payer: Self-pay

## 2023-02-03 ENCOUNTER — Emergency Department (HOSPITAL_COMMUNITY)
Admission: EM | Admit: 2023-02-03 | Discharge: 2023-02-03 | Disposition: A | Discharge status: Home / Self Care | Payer: PPO | Attending: Emergency Medicine | Admitting: Emergency Medicine

## 2023-02-03 DIAGNOSIS — Z8616 Personal history of COVID-19: Secondary | ICD-10-CM | POA: Diagnosis not present

## 2023-02-03 DIAGNOSIS — Z7951 Long term (current) use of inhaled steroids: Secondary | ICD-10-CM | POA: Insufficient documentation

## 2023-02-03 DIAGNOSIS — R062 Wheezing: Secondary | ICD-10-CM | POA: Insufficient documentation

## 2023-02-03 DIAGNOSIS — J45909 Unspecified asthma, uncomplicated: Secondary | ICD-10-CM | POA: Diagnosis not present

## 2023-02-03 DIAGNOSIS — R051 Acute cough: Secondary | ICD-10-CM | POA: Insufficient documentation

## 2023-02-03 DIAGNOSIS — R059 Cough, unspecified: Secondary | ICD-10-CM | POA: Diagnosis not present

## 2023-02-03 DIAGNOSIS — Z98811 Dental restoration status: Secondary | ICD-10-CM | POA: Diagnosis not present

## 2023-02-03 MED ORDER — IPRATROPIUM-ALBUTEROL 0.5-2.5 (3) MG/3ML IN SOLN
3.0000 mL | Freq: Once | RESPIRATORY_TRACT | Status: AC
  Administered 2023-02-03: 3 mL via RESPIRATORY_TRACT
  Filled 2023-02-03: qty 3

## 2023-02-03 NOTE — ED Provider Notes (Signed)
[place] EMERGENCY DEPARTMENT AT [facility] Provider Note   CSN: [id number] Arrival date & time: 02/03/23  1542     History  Chief Complaint  Patient presents with   Cough    Erik Gomez is a 82 y.o. male with history of asthma, presents with concern for a possible ingestion of a crown.  He was getting dental work yesterday and states the dentist accidentally dropped the crown down his throat.  He is unsure if he swallowed it or if it went down his windpipe.  He notes that since yesterday, he has been having dry cough and wheezing.  He tried his albuterol inhaler without improvement of his symptoms.  He contacted his dentist who told him to come to the ER to see if the crown might have landed in his lungs.  Patient reports the crown is plastic.  Patient reports he did have COVID 3 weeks ago and is unsure if this cough is his due to COVID.   Cough      Home Medications Prior to Admission medications   Medication Sig Start Date End Date Taking? Authorizing Provider  Acetaminophen (TYLENOL PO) Per bottle as needed    [provider]  Albuterol Sulfate (PROAIR RESPICLICK) 108 (90 Base) MCG/ACT AEPB Inhale 2 puffs into the lungs every 6 (six) hours as needed. Patient not taking: Reported on 12/12/2022 12/02/21   Chilton Greathouse, MD  Biotin 1000 MCG tablet Take 1,000 mcg by mouth daily.    [provider]  cetirizine (ZYRTEC) 10 MG tablet Take 10 mg by mouth daily at 2 PM.     [provider]  fluticasone-salmeterol (WIXELA INHUB) 250-50 MCG/ACT AEPB Inhale 1 puff into the lungs in the morning and at bedtime. 12/14/22   Mannam, Colbert Coyer, MD  montelukast (SINGULAIR) 10 MG tablet Take 1 tablet (10 mg total) by mouth daily. 01/20/23 05/01/24  Mannam, Colbert Coyer, MD  omeprazole (PRILOSEC) 40 MG capsule TAKE 1 CAPSULE BY MOUTH TWICE DAILY . 11/24/22   Meryl Dare, MD  vitamin D, CHOLECALCIFEROL, 400 UNITS tablet Take 1,000 Units by mouth daily.     [provider]      Allergies    Codeine, Meloxicam, and Pantoprazole sodium    Review of Systems   Review of Systems  Respiratory:  Positive for cough.     Physical Exam Updated Vital Signs BP (!) 166/70 (BP Location: Left Arm)   Pulse 60   Temp 97.9 F (36.6 C) (Oral)   Resp 18   Ht 5\' 10"  (1.778 m)   Wt 81.6 kg   SpO2 94%   BMI 25.83 kg/m  Physical Exam Vitals and nursing note reviewed.  Constitutional:      General: He is not in acute distress.    Appearance: He is well-developed.  HENT:     Head: Normocephalic and atraumatic.  Eyes:     Conjunctiva/sclera: Conjunctivae normal.  Cardiovascular:     Rate and Rhythm: Normal rate and regular rhythm.     Heart sounds: No murmur heard. Pulmonary:     Effort: Pulmonary effort is normal. No respiratory distress.     Comments: Wheezes in the upper and lower lung fields bilaterally No difficulty with breathing or increased work of breathing Abdominal:     Palpations: Abdomen is soft.     Tenderness: There is no abdominal tenderness.  Musculoskeletal:        General: No swelling.     Cervical back: Neck supple.  Skin:    General: Skin is warm and dry.     Capillary Refill: Capillary refill takes less than 2 seconds.  Neurological:     Mental Status: He is alert.  Psychiatric:        Mood and Affect: Mood normal.     ED Results / Procedures / Treatments   Labs (all labs ordered are listed, but only abnormal results are displayed) Labs Reviewed - No data to display  EKG None  Radiology DG Chest 2 View  Result Date: 02/03/2023 CLINICAL DATA:  Possible foreign body in lungs.  Lost dental crown. EXAM: CHEST - 2 VIEW COMPARISON:  Chest radiographs 06/05/2020 FINDINGS: The cardiomediastinal silhouette is unchanged with normal heart size. No airspace consolidation, edema, pleural effusion, pneumothorax, or radiopaque foreign body is identified. No acute osseous abnormality is seen. IMPRESSION: No active  cardiopulmonary disease. No radiopaque foreign body identified. Electronically Signed   By: Sebastian Ache M.D.   On: 02/03/2023 16:38    Procedures Procedures    Medications Ordered in ED Medications  ipratropium-albuterol (DUONEB) 0.5-2.5 (3) MG/3ML nebulizer solution 3 mL (3 mLs Nebulization Given 02/03/23 1757)    ED Course/ Medical Decision Making/ A&P                                 Medical Decision Making Amount and/or Complexity of Data Reviewed Radiology: ordered.  Risk Prescription drug management.   82 y.o. male with pertinent past medical history of asthma presents to the ED for concern of wheezing and cough for 1 day, there is concern for a swallowed tooth crown  Differential diagnosis includes but is not limited to asthma exacerbation, pneumonia, foreign body in the lung, cough due to COVID  ED Course:  Patient presents to the ER with concern for wheezing and cough that has been ongoing for the past day.  Started after he was seen by his dentist and possibly ingested a crown.  His dentist encouraged him to come to the ER for further evaluation.  He does have a history of asthma and took his albuterol inhaler without relief of his symptoms.  He is well-appearing with normal vital signs except for a elevated blood pressure of 166/70.  He is breathing comfortably on room air and satting 94%.  He does have wheezing noted on lung auscultation in the lungs bilaterally in all lung fields.  X-ray was obtained which showed no foreign body.  He does not have any consolidation, fever or chills, that would be concerning for pneumonia.  I discussed this case with Dr. Charm Barges who recommends we proceed with DuoNeb treatment for his wheezing. Patient was given 1 DuoNeb treatment.  On reexamination, patient states he feels his symptoms have improved slightly.  He still notes a slight cough.  Upon review listening to his lungs, there is still wheezing appreciated in all lung fields.  Since he  is stable and in no acute exacerbation, will have patient continue with Symbicort treatments at home with albuterol inhaler as needed.  Close follow-up with his pulmonologist Dr. Isaiah Serge within the next week. I had a thorough conversation with the patient where I discussed return precautions including any fever or chills, sputum production, difficulty breathing, any other new or concerning symptoms.  Patient verbalizes understanding.   Impression: Wheezing  Disposition:  The patient was discharged home with instructions to take Symbicort 1 puff each morning and night and use albuterol inhaler  as needed.  Follow-up with his pulmonologist within the next week. Return precautions given.  Imaging Studies ordered: I ordered imaging studies including chest x-ray I independently visualized the imaging with scope of interpretation limited to determining acute life threatening conditions related to emergency care. Imaging showed no foreign body, no consolidations I agree with the radiologist interpretation  Co morbidities that complicate the patient evaluation  Asthma            Final Clinical Impression(s) / ED Diagnoses Final diagnoses:  Acute cough  Wheezing on auscultation    Rx / DC Orders ED Discharge Orders     None         Arabella Merles, Cordelia Poche 02/03/23 1921    Terrilee Files, MD 02/04/23 1057

## 2023-02-03 NOTE — Discharge Instructions (Addendum)
Please continue taking your Symbicort as prescribed by your pulmonologist.  Please follow-up with your pulmonologist within the next week if your symptoms have not started to improve or get worse.   Return to the ER if you develop any fever or chills, worsening cough, difficulty breathing, any other new or concerning symptoms.

## 2023-02-03 NOTE — ED Triage Notes (Signed)
Pt was having a temporary crown placed yesterday when it was accidentally dropped down the pts throat. Pt endorses coughing an wheezing, was referred to the ER over concerns it may have gone down the pts throat.

## 2023-02-07 ENCOUNTER — Ambulatory Visit (INDEPENDENT_AMBULATORY_CARE_PROVIDER_SITE_OTHER): Payer: PPO

## 2023-02-07 ENCOUNTER — Other Ambulatory Visit: Payer: Self-pay | Admitting: Pulmonary Disease

## 2023-02-07 ENCOUNTER — Encounter: Payer: Self-pay | Admitting: Pulmonary Disease

## 2023-02-07 ENCOUNTER — Ambulatory Visit: Payer: PPO

## 2023-02-07 ENCOUNTER — Telehealth: Payer: Self-pay | Admitting: Pulmonary Disease

## 2023-02-07 ENCOUNTER — Ambulatory Visit: Payer: PPO | Admitting: Pulmonary Disease

## 2023-02-07 VITALS — BP 122/70 | HR 51 | Temp 97.9°F | Ht 70.0 in | Wt 183.0 lb

## 2023-02-07 DIAGNOSIS — T17908A Unspecified foreign body in respiratory tract, part unspecified causing other injury, initial encounter: Secondary | ICD-10-CM

## 2023-02-07 DIAGNOSIS — Z03822 Encounter for observation for suspected aspirated (inhaled) foreign body ruled out: Secondary | ICD-10-CM | POA: Diagnosis not present

## 2023-02-07 NOTE — Patient Instructions (Addendum)
Your x-rays look okay with no evidence of the crown.  We will get a CT chest without contrast at next available for better look at your lungs.  If abnormal we may need to schedule a procedure called a bronchoscope  Follow-up in 6 months

## 2023-02-07 NOTE — Telephone Encounter (Signed)
Pt wife called in bc her husband has had

## 2023-02-07 NOTE — Progress Notes (Signed)
Erik Gomez    [id number]    1941/03/31  Primary Care Physician:Aronson, Gerlene Burdock, MD  Referring Physician: Geoffry Paradise, MD [address] [place],  [place] [address]  Chief complaint: Follow-up for asthma, acute visit for swallowed dental crown  HPI: 82 y.o.  with history of asthma, GERD, vitamin D deficiency Maintained on Symbicort Follows with Dr. Russella Dar for GERD, LPR.  Had a modified barium swallow on 09/15/2020 which did not show any evidence of aspiration  Pets: No pets Occupation: Retired Naval architect Exposures: No mold, hot tub, Jacuzzi or asbestos exposure.  He has down pillows on the couch but hardly sits on it Smoking history: 12-pack-year smoker.  Quit in 1970 Travel history: No significant travel history Relevant family history: No significant family history of lung disease  Interval history: Here as an acute visit.  He was having some dental work done recently and accidentally swallowed his crown which is plastic in material.  He had a chest x-ray done in the ED on 02/03/2023 did not show any abnormality Send complains of occasional cough.  Denies any chest pain.  Outpatient Encounter Medications as of 02/07/2023  Medication Sig   Acetaminophen (TYLENOL PO) Per bottle as needed   Albuterol Sulfate (PROAIR RESPICLICK) 108 (90 Base) MCG/ACT AEPB Inhale 2 puffs into the lungs every 6 (six) hours as needed. (Patient not taking: Reported on 12/12/2022)   Biotin 1000 MCG tablet Take 1,000 mcg by mouth daily.   cetirizine (ZYRTEC) 10 MG tablet Take 10 mg by mouth daily at 2 PM.    fluticasone-salmeterol (WIXELA INHUB) 250-50 MCG/ACT AEPB Inhale 1 puff into the lungs in the morning and at bedtime.   montelukast (SINGULAIR) 10 MG tablet Take 1 tablet (10 mg total) by mouth daily.   omeprazole (PRILOSEC) 40 MG capsule TAKE 1 CAPSULE BY MOUTH TWICE DAILY .   vitamin D, CHOLECALCIFEROL, 400 UNITS tablet Take 1,000 Units by mouth daily.   No facility-administered  encounter medications on file as of 02/07/2023.   Physical Exam: Blood pressure 122/70, pulse (!) 51, temperature 97.9 F (36.6 C), temperature source Oral, height 5\' 10"  (1.778 m), weight 183 lb (83 kg), SpO2 96%. Gen:      No acute distress HEENT:  EOMI, sclera anicteric Neck:     No masses; no thyromegaly Lungs:    Clear to auscultation bilaterally; normal respiratory effort CV:         Regular rate and rhythm; no murmurs Abd:      + bowel sounds; soft, non-tender; no palpable masses, no distension Ext:    No edema; adequate peripheral perfusion Skin:      Warm and dry; no rash Neuro: alert and oriented x 3 Psych: normal mood and affect   Data Reviewed: Imaging: Chest x-ray 11/28/2016-mild continue still look forward he got labs recently at his right base subsegmental atelectasis.  Borderline cardiomegaly.  Chest x-ray 05/18/2020- no active cardiopulmonary disease   Modified barium swallow 09/15/2020-no aspiration  Chest x-ray 02/03/2023-no active cardiopulmonary disease, no radio opaque foreign body I have reviewed the images personally.  PFTs: 06/13/2019 FVC 3.24 [77%], FEV1 2.06 [68%], F/F 63, TLC 7.50 [96%], DLCO 25.17 [101%] Moderate obstructive airway disease with air trapping and bronchodilator response.  ACT score  07/05/2020- 25 12/31/2021- 24  Labs: CBC 10/02/2020-WBC 4.6, eos 2.2%, absolute eosinophil count 101 IgE 10/02/2020-527  Assessment:  Swallowed dental crown. Will get the x-ray of his neck today.  Unfortunately the plastic  crown may not show up on the x-ray and I will get a full CT chest without contrast for evaluation of his diabetes.  If there is evidence of a foreign body then he will need a bronchoscope for foreign body retrieval  Asthma Started on Symbicort in April 2022 with good control of symptoms Will check with pharmacy see if there are any cheaper alternatives to Symbicort as it is too expensive now. Continues to have intermittent hoarseness of  voice secondary to GERD, LPR  He is using Symbicort with spacer.  Reminded to rinse mouth and gargle after each use Continue Singulair  GERD, LPR Follows with [facility] Continue PPI  Discussed ENT evaluation for persistent hoarseness but he would like to hold off for now  Plan/Recommendations: X-ray neck, CT chest Symbicort. Continue Singulair  Chilton Greathouse MD [facility] 02/07/2023, 9:08 AM  CC: Geoffry Paradise, MD

## 2023-02-16 ENCOUNTER — Telehealth: Payer: Self-pay

## 2023-02-16 NOTE — Telephone Encounter (Signed)
Transition Care Management Follow-up Telephone Call Date of discharge and from where: 02/03/2023 [facility] How have you been since you were released from the hospital? Patient stated he is feeling better but still has some wheezing and cough. Any questions or concerns? No  Items Reviewed: Did the pt receive and understand the discharge instructions provided? Yes  Medications obtained and verified?  No medication prescribed. Other? No  Any new allergies since your discharge? No  Dietary orders reviewed? Yes Do you have support at home? Yes   Follow up appointments reviewed:  PCP Hospital f/u appt confirmed? No  Scheduled to see  on  @ . Specialist Hospital f/u appt confirmed? Yes  Scheduled to see Chilton Greathouse, MD on 02/07/2023 @ [facility] Are transportation arrangements needed? No  If their condition worsens, is the pt aware to call PCP or go to the Emergency Dept.? Yes Was the patient provided with contact information for the PCP's office or ED? Yes Was to pt encouraged to call back with questions or concerns? Yes  Taleigh Gero [facility]  [facility], [facility] Guide Direct Dial: [phone]  Website: [email or web address]

## 2023-02-21 DIAGNOSIS — N401 Enlarged prostate with lower urinary tract symptoms: Secondary | ICD-10-CM | POA: Diagnosis not present

## 2023-02-21 DIAGNOSIS — R350 Frequency of micturition: Secondary | ICD-10-CM | POA: Diagnosis not present

## 2023-02-21 DIAGNOSIS — R351 Nocturia: Secondary | ICD-10-CM | POA: Diagnosis not present

## 2023-02-22 ENCOUNTER — Ambulatory Visit
Admission: RE | Admit: 2023-02-22 | Discharge: 2023-02-22 | Disposition: A | Discharge status: Home / Self Care | Payer: PPO | Source: Ambulatory Visit | Attending: Pulmonary Disease | Admitting: Pulmonary Disease

## 2023-02-22 DIAGNOSIS — T17908A Unspecified foreign body in respiratory tract, part unspecified causing other injury, initial encounter: Secondary | ICD-10-CM

## 2023-02-22 DIAGNOSIS — I700 Atherosclerosis of aorta: Secondary | ICD-10-CM

## 2023-02-22 DIAGNOSIS — T17598A Other foreign object in bronchus causing other injury, initial encounter: Secondary | ICD-10-CM | POA: Diagnosis not present

## 2023-02-22 HISTORY — DX: Atherosclerosis of aorta: I70.0

## 2023-02-24 NOTE — Telephone Encounter (Signed)
e 

## 2023-03-01 DIAGNOSIS — M5416 Radiculopathy, lumbar region: Secondary | ICD-10-CM | POA: Diagnosis not present

## 2023-03-06 ENCOUNTER — Telehealth: Payer: Self-pay | Admitting: Pulmonary Disease

## 2023-03-06 NOTE — Telephone Encounter (Signed)
Pt calling in to go over his CT results. Pt stated it is okay to leave detail VM on his phone

## 2023-03-07 ENCOUNTER — Telehealth: Payer: Self-pay | Admitting: Pulmonary Disease

## 2023-03-07 NOTE — Telephone Encounter (Signed)
Call Report CT Chest 02/22/23

## 2023-03-07 NOTE — Telephone Encounter (Signed)
Received call report from [facility]  with [facility] Radiology on patient's CT chest  done on 02/22/23. Dr. Isaiah Serge, please review the result/impression copied below:  IMPRESSION: 1. Curvilinear foreign body in the bronchus intermedius, compatible with an aspirated dental crown. 2.  Aortic Atherosclerosis      Please advise, thank you.

## 2023-03-07 NOTE — Telephone Encounter (Signed)
Called and spoke with patient.  Patient aware CT chest results request has been sent to Dr. Isaiah Serge and someone will call patient back later today. Call report was received this morning and a message was sent to Dr. Isaiah Serge to advise.  Dr. Isaiah Serge is aware.

## 2023-03-08 ENCOUNTER — Encounter (HOSPITAL_COMMUNITY): Payer: Self-pay | Admitting: Pulmonary Disease

## 2023-03-08 ENCOUNTER — Other Ambulatory Visit: Payer: Self-pay

## 2023-03-08 NOTE — Progress Notes (Signed)
PCP - Dr Geoffry Paradise Cardiologist - none Gastro - Dr Claudette Head Pulmonology - Dr Chilton Greathouse  CT Chest x-ray - 02/22/23 EKG - DOS Stress Test - n/a ECHO - n/a Cardiac Cath - n/a  ICD Pacemaker/Loop - n/a  Sleep Study -  n/a CPAP - none  Diabetes - n/a  NPO  STOP now taking any Aspirin (unless otherwise instructed by your surgeon), Aleve, Naproxen, Ibuprofen, Motrin, Advil, Goody's, BC's, all herbal medications, fish oil, and all vitamins.   Coronavirus Screening Do you have any of the following symptoms:  Cough occasional Fever (>100.62F)  yes/no: No Runny nose yes/no: No Sore throat yes/no: No Difficulty breathing/shortness of breath  yes/no: No  Have you traveled in the last 14 days and where? yes/no: No  Patient verbalized understanding of instructions that were given via phone.

## 2023-03-08 NOTE — Anesthesia Preprocedure Evaluation (Signed)
Anesthesia Evaluation  Patient identified by MRN, date of birth, ID band Patient awake    Reviewed: Allergy & Precautions, H&P , NPO status , Patient's Chart, lab work & pertinent test results  Airway Mallampati: II  TM Distance: >3 FB Neck ROM: Full    Dental no notable dental hx. (+) Teeth Intact, Dental Advisory Given   Pulmonary asthma , former smoker   Pulmonary exam normal breath sounds clear to auscultation       Cardiovascular Exercise Tolerance: Good negative cardio ROS  Rhythm:Regular Rate:Normal     Neuro/Psych  Headaches  negative psych ROS   GI/Hepatic Neg liver ROS, hiatal hernia,GERD  Medicated,,  Endo/Other  negative endocrine ROS    Renal/GU negative Renal ROS  negative genitourinary   Musculoskeletal  (+) Arthritis , Osteoarthritis,    Abdominal   Peds  Hematology negative hematology ROS (+)   Anesthesia Other Findings   Reproductive/Obstetrics negative OB ROS                             Anesthesia Physical Anesthesia Plan  ASA: 2  Anesthesia Plan: General   Post-op Pain Management: Minimal or no pain anticipated   Induction: Intravenous  PONV Risk Score and Plan: 2 and Ondansetron, Dexamethasone, Propofol infusion and TIVA  Airway Management Planned: Oral ETT  Additional Equipment:   Intra-op Plan:   Post-operative Plan: Extubation in OR  Informed Consent: I have reviewed the patients History and Physical, chart, labs and discussed the procedure including the risks, benefits and alternatives for the proposed anesthesia with the patient or authorized representative who has indicated his/her understanding and acceptance.     Dental advisory given  Plan Discussed with: CRNA  Anesthesia Plan Comments:        Anesthesia Quick Evaluation

## 2023-03-09 ENCOUNTER — Encounter (HOSPITAL_COMMUNITY): Payer: Self-pay | Admitting: Pulmonary Disease

## 2023-03-09 ENCOUNTER — Ambulatory Visit (HOSPITAL_COMMUNITY)
Admission: RE | Admit: 2023-03-09 | Discharge: 2023-03-09 | Disposition: A | Discharge status: Home / Self Care | Payer: PPO | Attending: Pulmonary Disease | Admitting: Pulmonary Disease

## 2023-03-09 ENCOUNTER — Ambulatory Visit (HOSPITAL_BASED_OUTPATIENT_CLINIC_OR_DEPARTMENT_OTHER): Payer: PPO | Admitting: Anesthesiology

## 2023-03-09 ENCOUNTER — Ambulatory Visit (HOSPITAL_COMMUNITY): Payer: PPO | Admitting: Anesthesiology

## 2023-03-09 ENCOUNTER — Encounter (HOSPITAL_COMMUNITY)
Admission: RE | Disposition: A | Discharge status: Home / Self Care | Payer: Self-pay | Source: Home / Self Care | Attending: Pulmonary Disease

## 2023-03-09 ENCOUNTER — Ambulatory Visit (HOSPITAL_COMMUNITY): Payer: PPO

## 2023-03-09 DIAGNOSIS — T17908A Unspecified foreign body in respiratory tract, part unspecified causing other injury, initial encounter: Secondary | ICD-10-CM

## 2023-03-09 DIAGNOSIS — T17898A Other foreign object in other parts of respiratory tract causing other injury, initial encounter: Secondary | ICD-10-CM | POA: Diagnosis not present

## 2023-03-09 DIAGNOSIS — T17908S Unspecified foreign body in respiratory tract, part unspecified causing other injury, sequela: Secondary | ICD-10-CM

## 2023-03-09 DIAGNOSIS — Z79899 Other long term (current) drug therapy: Secondary | ICD-10-CM | POA: Diagnosis not present

## 2023-03-09 DIAGNOSIS — M1990 Unspecified osteoarthritis, unspecified site: Secondary | ICD-10-CM | POA: Insufficient documentation

## 2023-03-09 DIAGNOSIS — E559 Vitamin D deficiency, unspecified: Secondary | ICD-10-CM | POA: Insufficient documentation

## 2023-03-09 DIAGNOSIS — K219 Gastro-esophageal reflux disease without esophagitis: Secondary | ICD-10-CM | POA: Diagnosis not present

## 2023-03-09 DIAGNOSIS — K449 Diaphragmatic hernia without obstruction or gangrene: Secondary | ICD-10-CM | POA: Diagnosis not present

## 2023-03-09 DIAGNOSIS — J45909 Unspecified asthma, uncomplicated: Secondary | ICD-10-CM | POA: Diagnosis not present

## 2023-03-09 DIAGNOSIS — T17900A Unspecified foreign body in respiratory tract, part unspecified causing asphyxiation, initial encounter: Secondary | ICD-10-CM | POA: Diagnosis not present

## 2023-03-09 DIAGNOSIS — R519 Headache, unspecified: Secondary | ICD-10-CM | POA: Diagnosis not present

## 2023-03-09 DIAGNOSIS — T17808A Unspecified foreign body in other parts of respiratory tract causing other injury, initial encounter: Secondary | ICD-10-CM

## 2023-03-09 DIAGNOSIS — Z87891 Personal history of nicotine dependence: Secondary | ICD-10-CM | POA: Diagnosis not present

## 2023-03-09 DIAGNOSIS — W449XXA Unspecified foreign body entering into or through a natural orifice, initial encounter: Secondary | ICD-10-CM | POA: Diagnosis not present

## 2023-03-09 HISTORY — PX: FOREIGN BODY REMOVAL: SHX962

## 2023-03-09 HISTORY — DX: Pneumonia, unspecified organism: J18.9

## 2023-03-09 HISTORY — PX: VIDEO BRONCHOSCOPY: SHX5072

## 2023-03-09 LAB — CBC
HCT: 39 % (ref 39.0–52.0)
Hemoglobin: 13.1 g/dL (ref 13.0–17.0)
MCH: 30.8 pg (ref 26.0–34.0)
MCHC: 33.6 g/dL (ref 30.0–36.0)
MCV: 91.5 fL (ref 80.0–100.0)
Platelets: 152 10*3/uL (ref 150–400)
RBC: 4.26 MIL/uL (ref 4.22–5.81)
RDW: 13.2 % (ref 11.5–15.5)
WBC: 3.5 10*3/uL — ABNORMAL LOW (ref 4.0–10.5)
nRBC: 0 % (ref 0.0–0.2)

## 2023-03-09 LAB — BASIC METABOLIC PANEL
Anion gap: 13 (ref 5–15)
BUN: 16 mg/dL (ref 8–23)
CO2: 23 mmol/L (ref 22–32)
Calcium: 9.1 mg/dL (ref 8.9–10.3)
Chloride: 104 mmol/L (ref 98–111)
Creatinine, Ser: 1.17 mg/dL (ref 0.61–1.24)
GFR, Estimated: >60 mL/min (ref >60)
Glucose, Bld: 91 mg/dL (ref 70–99)
Potassium: 4 mmol/L (ref 3.5–5.1)
Sodium: 140 mmol/L (ref 135–145)

## 2023-03-09 SURGERY — BRONCHOSCOPY, WITH FLUOROSCOPY
Anesthesia: General

## 2023-03-09 MED ORDER — ONDANSETRON HCL 4 MG/2ML IJ SOLN
INTRAMUSCULAR | Status: DC | PRN
  Administered 2023-03-09: 4 mg via INTRAVENOUS

## 2023-03-09 MED ORDER — CHLORHEXIDINE GLUCONATE 0.12 % MT SOLN
OROMUCOSAL | Status: AC
  Administered 2023-03-09: 15 mL via OROMUCOSAL
  Filled 2023-03-09: qty 15

## 2023-03-09 MED ORDER — SUGAMMADEX SODIUM 200 MG/2ML IV SOLN
INTRAVENOUS | Status: DC | PRN
  Administered 2023-03-09: 200 mg via INTRAVENOUS

## 2023-03-09 MED ORDER — DEXAMETHASONE SODIUM PHOSPHATE 10 MG/ML IJ SOLN
INTRAMUSCULAR | Status: DC | PRN
  Administered 2023-03-09: 5 mg via INTRAVENOUS

## 2023-03-09 MED ORDER — LIDOCAINE 2% (20 MG/ML) 5 ML SYRINGE
INTRAMUSCULAR | Status: DC | PRN
  Administered 2023-03-09: 60 mg via INTRAVENOUS

## 2023-03-09 MED ORDER — CHLORHEXIDINE GLUCONATE 0.12 % MT SOLN: 15.0000 mL | Freq: Once | OROMUCOSAL | Status: AC

## 2023-03-09 MED ORDER — ROCURONIUM BROMIDE 10 MG/ML (PF) SYRINGE
PREFILLED_SYRINGE | INTRAVENOUS | Status: DC | PRN
  Administered 2023-03-09: 50 mg via INTRAVENOUS

## 2023-03-09 MED ORDER — FENTANYL CITRATE (PF) 100 MCG/2ML IJ SOLN: 25.0000 ug | INTRAMUSCULAR | Status: DC | PRN

## 2023-03-09 MED ORDER — LACTATED RINGERS IV SOLN: INTRAVENOUS | Status: DC

## 2023-03-09 MED ORDER — PROPOFOL 10 MG/ML IV BOLUS
INTRAVENOUS | Status: DC | PRN
Start: 2023-03-09 — End: 2023-03-09
  Administered 2023-03-09: 120 mg via INTRAVENOUS

## 2023-03-09 MED ORDER — PHENYLEPHRINE HCL-NACL 20-0.9 MG/250ML-% IV SOLN
INTRAVENOUS | Status: DC | PRN
  Administered 2023-03-09: 20 ug/min via INTRAVENOUS

## 2023-03-09 MED ORDER — PROPOFOL 500 MG/50ML IV EMUL
INTRAVENOUS | Status: DC | PRN
Start: 2023-03-09 — End: 2023-03-09
  Administered 2023-03-09: 100 ug/kg/min via INTRAVENOUS

## 2023-03-09 MED ORDER — FENTANYL CITRATE (PF) 100 MCG/2ML IJ SOLN
INTRAMUSCULAR | Status: AC
  Filled 2023-03-09: qty 2

## 2023-03-09 NOTE — Transfer of Care (Signed)
Immediate Anesthesia Transfer of Care Note  Patient: Erik Gomez  Procedure(s) Performed: VIDEO BRONCHOSCOPY WITH FLUORO FOREIGN BODY REMOVAL  Patient Location: PACU  Anesthesia Type:General  Level of Consciousness: awake  Airway & Oxygen Therapy: Patient Spontanous Breathing and Patient connected to face mask oxygen  Post-op Assessment: Report given to RN and Post -op Vital signs reviewed and stable  Post vital signs: Reviewed and stable  Last Vitals:  Vitals Value Taken Time  BP 108/60 03/09/23 0900  Temp    Pulse 62 03/09/23 0901  Resp 12 03/09/23 0901  SpO2 95 % 03/09/23 0901  Vitals shown include unfiled device data.  Last Pain:  Vitals:   03/09/23 1610  TempSrc:   PainSc: 0-No pain         Complications: No notable events documented.

## 2023-03-09 NOTE — Anesthesia Procedure Notes (Signed)
Procedure Name: Intubation Date/Time: 03/09/2023 8:13 AM  Performed by: Alwyn Ren, CRNAPre-anesthesia Checklist: Patient identified, Emergency Drugs available, Suction available and Patient being monitored Patient Re-evaluated:Patient Re-evaluated prior to induction Oxygen Delivery Method: Circle system utilized Preoxygenation: Pre-oxygenation with 100% oxygen Induction Type: IV induction Ventilation: Mask ventilation without difficulty Laryngoscope Size: Miller and 2 Grade View: Grade I Tube type: Oral Tube size: 8.5 mm Number of attempts: 1 Airway Equipment and Method: Stylet and Oral airway Placement Confirmation: ETT inserted through vocal cords under direct vision, positive ETCO2 and breath sounds checked- equal and bilateral Secured at: 22 cm Tube secured with: Tape Dental Injury: Teeth and Oropharynx as per pre-operative assessment

## 2023-03-09 NOTE — H&P (Addendum)
Erik Gomez    [id number]    10-13-1940  Primary Care Physician:Aronson, Gerlene Burdock, MD  Referring Physician: No referring provider defined for this encounter.  Chief complaint: Follow-up for asthma, swallowed dental crown  HPI: 82 y.o.  with history of asthma, GERD, vitamin D deficiency Maintained on Symbicort Follows with Dr. Russella Dar for GERD, LPR.  Had a modified barium swallow on 09/15/2020 which did not show any evidence of aspiration  Pets: No pets Occupation: Retired [employer] architect Exposures: No mold, hot tub, Jacuzzi or asbestos exposure.  He has down pillows on the couch but hardly sits on it Smoking history: 12-pack-year smoker.  Quit in 1970 Travel history: No significant travel history Relevant family history: No significant family history of lung disease  Interval history: Here as an acute visit.  He was having some dental work done recently and accidentally swallowed his crown which is plastic in material.  He had a chest x-ray done in the ED on 02/03/2023 did not show any abnormality Send complains of occasional cough.  Denies any chest pain.  Outpatient Encounter Medications as of 02/07/2023  Medication Sig   Acetaminophen (TYLENOL PO) Per bottle as needed   Albuterol Sulfate (PROAIR RESPICLICK) 108 (90 Base) MCG/ACT AEPB Inhale 2 puffs into the lungs every 6 (six) hours as needed. (Patient not taking: Reported on 12/12/2022)   Biotin 1000 MCG tablet Take 1,000 mcg by mouth daily.   cetirizine (ZYRTEC) 10 MG tablet Take 10 mg by mouth daily at 2 PM.    fluticasone-salmeterol (WIXELA INHUB) 250-50 MCG/ACT AEPB Inhale 1 puff into the lungs in the morning and at bedtime.   montelukast (SINGULAIR) 10 MG tablet Take 1 tablet (10 mg total) by mouth daily.   omeprazole (PRILOSEC) 40 MG capsule TAKE 1 CAPSULE BY MOUTH TWICE DAILY .   vitamin D, CHOLECALCIFEROL, 400 UNITS tablet Take 1,000 Units by mouth daily.   No facility-administered encounter medications on file  as of 02/07/2023.   Physical Exam: Blood pressure 122/70, pulse (!) 51, temperature 97.9 F (36.6 C), temperature source Oral, height 5\' 10"  (1.778 m), weight 183 lb (83 kg), SpO2 96%. Gen:      No acute distress HEENT:  EOMI, sclera anicteric Neck:     No masses; no thyromegaly Lungs:    Clear to auscultation bilaterally; normal respiratory effort CV:         Regular rate and rhythm; no murmurs Abd:      + bowel sounds; soft, non-tender; no palpable masses, no distension Ext:    No edema; adequate peripheral perfusion Skin:      Warm and dry; no rash Neuro: alert and oriented x 3 Psych: normal mood and affect   Data Reviewed: Imaging: Chest x-ray 11/28/2016-mild continue still look forward he got labs recently at his right base subsegmental atelectasis.  Borderline cardiomegaly.  Chest x-ray 05/18/2020- no active cardiopulmonary disease   Modified barium swallow 09/15/2020-no aspiration  CT chest 02/22/2023-curvilinear body in the right bronchus intermedius consistent with aspirated dental crown I have reviewed the images personally.  PFTs: 06/13/2019 FVC 3.24 [77%], FEV1 2.06 [68%], F/F 63, TLC 7.50 [96%], DLCO 25.17 [101%] Moderate obstructive airway disease with air trapping and bronchodilator response.  ACT score  07/05/2020- 25 12/31/2021- 24  Labs: CBC 10/02/2020-WBC 4.6, eos 2.2%, absolute eosinophil count 101 IgE 10/02/2020-527  Assessment:  Swallowed dental crown. The scan reviewed with foreign body in the bronchus intermedius.  He presents for bronchoscopy  today for foreign body retrieval Risk/benefit reviewed with patient and he has agreed to proceed.  Plan/Recommendations: Bronchoscopy for foreign body retrieval  Chilton Greathouse MD [facility] 03/09/2023, 8:02 AM  CC: No ref. provider found  Addendum: Retrieved dental crown

## 2023-03-09 NOTE — Op Note (Signed)
[facility] Cardiopulmonary Patient Name: Erik Gomez Date: 03/09/2023 MRN: [id number] Attending MD: Chilton Greathouse , MD, [id number] Date of Birth: 12-12-1940 CSN: Finalized Age: 82 Admit Type: Outpatient Gender: Male Procedure:             Bronchoscopy Indications:           Lung mass, Lung foreign body Providers:             Chilton Greathouse, MD, Eliberto Ivory, RN, Adin Hector, RN,                         Faustina Mbumina, Technician Referring MD:           Medicines:             General Anesthesia, GA Complications:         No immediate complications Estimated Blood Loss:  Estimated blood loss: none. Procedure:             Pre-Anesthesia Assessment:                        - A History and Physical has been performed. Patient                         meds and allergies have been reviewed. The risks and                         benefits of the procedure and the sedation options and                         risks were discussed with the patient. All questions                         were answered and informed consent was obtained.                         Patient identification and proposed procedure were                         verified prior to the procedure by the physician in                         the pre-procedure area. Mental Status Examination:                         alert and oriented. Airway Examination: normal                         oropharyngeal airway. Respiratory Examination: clear                         to auscultation. CV Examination: RRR, no murmurs, no                         S3 or S4. ASA Grade Assessment: II - A patient with                         mild systemic disease. After reviewing the risks and  benefits, the patient was deemed in satisfactory                         condition to undergo the procedure. The anesthesia                         plan was to use general anesthesia. Immediately prior                          to administration of medications, the patient was                         re-assessed for adequacy to receive sedatives. The                         heart rate, respiratory rate, oxygen saturations,                         blood pressure, adequacy of pulmonary ventilation, and                         response to care were monitored throughout the                         procedure. The physical status of the patient was                         re-assessed after the procedure.                        After obtaining informed consent, the bronchoscope was                         passed under direct vision. Throughout the procedure,                         the patient's blood pressure, pulse, and oxygen                         saturations were monitored continuously. the BF-H190                         ([id number]) Olympus bronchoscope was introduced through                         the mouth, via the endotracheal tube (the patient was                         intubated for the procedure) and advanced to the                         tracheobronchial tree of both lungs. Scope In: Scope Out: Findings:      The endotracheal tube is in good position. The trachea is of normal       caliber. The carina is sharp. The tracheobronchial tree was examined to       at least the first subsegmental level. Bronchial mucosa and anatomy are       normal; there are no endobronchial lesions, and  no secretions.      Respiratory tract: The larynx is normal. The vocal cords appear normal.       The subglottic space is normal. The trachea is of normal caliber. The       carina is sharp. The entire tracheobronchial tree was examined to at       least the first subsegmental level. Bronchial mucosa and anatomy are       normal; there are no endobronchial lesions and no secretions, except in       the bronchus intermedius where the dental crown was found. Attempt made       to retrieve it with forceps which was  unsucessful. A basket was then       used to secure the foreign body but we were unable to pass it though the       ETT. Anesthesia was reversed and the entire ETT with the bronchoscope       and secured dental crown was removed all together. Pateint tolerated the       procedure well Impression:            - Lung mass                        - Lung foreign body                        - The airway examination was normal.                        - Dental crown was retrieved Moderate Sedation:      GA Recommendation:        - Follow up in clinic as previously scheduled. Procedure Code(s):     --- Professional ---                        [phone], Bronchoscopy, rigid or flexible, including                         fluoroscopic guidance, when performed; diagnostic,                         with cell washing, when performed (separate procedure) Diagnosis Code(s):     --- Professional ---                        T17.808A, Unspecified foreign body in other parts of                         respiratory tract causing other injury, initial                         encounter CPT copyright 2022 American Medical Association. All rights reserved. The codes documented in this report are preliminary and upon coder review may  be revised to meet current compliance requirements. Chilton Greathouse, MD Chilton Greathouse, MD 03/09/2023 8:58:14 AM Number of Addenda: 0

## 2023-03-09 NOTE — Anesthesia Postprocedure Evaluation (Signed)
Anesthesia Post Note  Patient: Taft M Bogusz  Procedure(s) Performed: VIDEO BRONCHOSCOPY WITH FLUORO FOREIGN BODY REMOVAL     Patient location during evaluation: PACU Anesthesia Type: General Level of consciousness: awake and alert Pain management: pain level controlled Vital Signs Assessment: post-procedure vital signs reviewed and stable Respiratory status: spontaneous breathing, nonlabored ventilation and respiratory function stable Cardiovascular status: blood pressure returned to baseline and stable Postop Assessment: no apparent nausea or vomiting Anesthetic complications: no  No notable events documented.  Last Vitals:  Vitals:   03/09/23 0930 03/09/23 0935  BP: 126/64   Pulse: (!) 53 (!) 52  Resp: 16 15  Temp:  36.8 C  SpO2: 92% 94%    Last Pain:  Vitals:   03/09/23 0935  TempSrc:   PainSc: 0-No pain                 Alahna Dunne,W. EDMOND

## 2023-03-11 DIAGNOSIS — Z23 Encounter for immunization: Secondary | ICD-10-CM | POA: Diagnosis not present

## 2023-03-13 ENCOUNTER — Encounter (HOSPITAL_COMMUNITY): Payer: Self-pay | Admitting: Pulmonary Disease

## 2023-03-21 DIAGNOSIS — M5416 Radiculopathy, lumbar region: Secondary | ICD-10-CM | POA: Diagnosis not present

## 2023-04-04 DIAGNOSIS — M5416 Radiculopathy, lumbar region: Secondary | ICD-10-CM | POA: Diagnosis not present

## 2023-04-25 DIAGNOSIS — H43813 Vitreous degeneration, bilateral: Secondary | ICD-10-CM | POA: Diagnosis not present

## 2023-04-25 DIAGNOSIS — H0100B Unspecified blepharitis left eye, upper and lower eyelids: Secondary | ICD-10-CM | POA: Diagnosis not present

## 2023-04-25 DIAGNOSIS — H35363 Drusen (degenerative) of macula, bilateral: Secondary | ICD-10-CM | POA: Diagnosis not present

## 2023-04-25 DIAGNOSIS — H524 Presbyopia: Secondary | ICD-10-CM | POA: Diagnosis not present

## 2023-04-25 DIAGNOSIS — H0100A Unspecified blepharitis right eye, upper and lower eyelids: Secondary | ICD-10-CM | POA: Diagnosis not present

## 2023-04-25 DIAGNOSIS — H04123 Dry eye syndrome of bilateral lacrimal glands: Secondary | ICD-10-CM | POA: Diagnosis not present

## 2023-05-02 DIAGNOSIS — R351 Nocturia: Secondary | ICD-10-CM | POA: Diagnosis not present

## 2023-05-02 DIAGNOSIS — R350 Frequency of micturition: Secondary | ICD-10-CM | POA: Diagnosis not present

## 2023-05-28 DIAGNOSIS — M5416 Radiculopathy, lumbar region: Secondary | ICD-10-CM | POA: Diagnosis not present

## 2023-06-13 DIAGNOSIS — R350 Frequency of micturition: Secondary | ICD-10-CM | POA: Diagnosis not present

## 2023-06-13 DIAGNOSIS — N401 Enlarged prostate with lower urinary tract symptoms: Secondary | ICD-10-CM | POA: Diagnosis not present

## 2023-06-22 DIAGNOSIS — M5416 Radiculopathy, lumbar region: Secondary | ICD-10-CM | POA: Diagnosis not present

## 2023-06-26 ENCOUNTER — Ambulatory Visit: Payer: PPO | Admitting: Pulmonary Disease

## 2023-06-26 ENCOUNTER — Encounter: Payer: Self-pay | Admitting: Pulmonary Disease

## 2023-06-26 VITALS — BP 135/77 | HR 64 | Ht 70.0 in | Wt 188.0 lb

## 2023-06-26 DIAGNOSIS — J4540 Moderate persistent asthma, uncomplicated: Secondary | ICD-10-CM

## 2023-06-26 DIAGNOSIS — T17908S Unspecified foreign body in respiratory tract, part unspecified causing other injury, sequela: Secondary | ICD-10-CM | POA: Diagnosis not present

## 2023-06-26 MED ORDER — BUDESONIDE-FORMOTEROL FUMARATE 160-4.5 MCG/ACT IN AERO: 2.0000 | INHALATION_SPRAY | Freq: Two times a day (BID) | RESPIRATORY_TRACT | 3 refills | Status: DC

## 2023-06-26 NOTE — Patient Instructions (Signed)
VISIT SUMMARY:  During today's visit, we discussed your asthma symptoms, particularly the hoarseness and increased coughing you have experienced after using the [facility] inhaler. We also reviewed your history of aspirating a dental crown, which was successfully removed in October.  YOUR PLAN:  -ASTHMA: Asthma is a condition where your airways narrow and swell, producing extra mucus, which can make breathing difficult. You reported hoarseness and increased coughing after using the Breo inhaler. We discussed switching back to Symbicort, which may be less likely to cause these side effects, especially when used with a spacer. You should use Symbicort twice a day with a spacer. If Symbicort is not effective or the cost becomes an issue, please contact our office to discuss switching back to [facility].  -ASPIRATION OF DENTAL CROWN: Aspiration of a dental crown means that the crown was accidentally inhaled into your airways. This was successfully removed with a bronchoscopy in October, and you have no current symptoms. We will order a follow-up chest scan to ensure no scarring has occurred and schedule a follow-up appointment in six months.  INSTRUCTIONS:  Please schedule a follow-up chest scan to ensure no scarring has occurred from the aspiration of the dental crown. Additionally, make an appointment for a follow-up visit in six months.

## 2023-06-26 NOTE — Progress Notes (Signed)
Erik Gomez    [id number]    07/18/1940  Primary Care Physician:Aronson, Gerlene Burdock, MD  Referring Physician: Geoffry Paradise, MD [address] [place],  [place] [address]  Chief complaint: Follow-up for asthma, acute visit for swallowed dental crown  HPI: 83 y.o.  with history of asthma, GERD, vitamin D deficiency Maintained on Symbicort Follows with Dr. Russella Dar for GERD, LPR.  Had a modified barium swallow on 09/15/2020 which did not show any evidence of aspiration  Pets: No pets Occupation: Retired Naval architect Exposures: No mold, hot tub, Jacuzzi or asbestos exposure.  He has down pillows on the couch but hardly sits on it Smoking history: 12-pack-year smoker.  Quit in 1970 Travel history: No significant travel history Relevant family history: No significant family history of lung disease  Interval history: Discussed the use of AI scribe software for clinical note transcription with the patient, who gave verbal consent to proceed.  The patient, with a history of asthma, presents with hoarseness and increased coughing after using his Breo inhaler. He describes a sensation of dryness in his throat after using the inhaler, which temporarily improves with warm salt water gargles but returns within a day or two. He has tried [place] in the past, which was ineffective. The patient is considering returning to Symbicort, despite the higher cost, due to the side effects of the Breo inhaler.  In addition, the patient had a dental crown aspirated, which was successfully removed with bronchoscopy in October 2024. He has no residual symptoms from this incident.   Outpatient Encounter Medications as of 06/26/2023  Medication Sig   Acetaminophen (TYLENOL PO) Per bottle as needed   Albuterol Sulfate (PROAIR RESPICLICK) 108 (90 Base) MCG/ACT AEPB Inhale 2 puffs into the lungs every 6 (six) hours as needed.   Biotin 1000 MCG tablet Take 1,000 mcg by mouth daily.   cetirizine  (ZYRTEC) 10 MG tablet Take 10 mg by mouth at bedtime.   fluticasone-salmeterol (WIXELA INHUB) 250-50 MCG/ACT AEPB Inhale 1 puff into the lungs in the morning and at bedtime.   montelukast (SINGULAIR) 10 MG tablet Take 1 tablet (10 mg total) by mouth daily.   Multiple Vitamins-Minerals (LUTEIN-ZEAXANTHIN) TABS Take 2 mg by mouth daily.   omeprazole (PRILOSEC) 40 MG capsule TAKE 1 CAPSULE BY MOUTH TWICE DAILY .   tamsulosin (FLOMAX) 0.4 MG CAPS capsule Take 0.4 mg by mouth daily.   vitamin D, CHOLECALCIFEROL, 400 UNITS tablet Take 1,000 Units by mouth daily.   No facility-administered encounter medications on file as of 06/26/2023.   Physical Exam: Blood pressure 135/77, pulse 64, height 5\' 10"  (1.778 m), weight 188 lb (85.3 kg), SpO2 96%. Gen:      No acute distress HEENT:  EOMI, sclera anicteric Neck:     No masses; no thyromegaly Lungs:    Clear to auscultation bilaterally; normal respiratory effort CV:         Regular rate and rhythm; no murmurs Abd:      + bowel sounds; soft, non-tender; no palpable masses, no distension Ext:    No edema; adequate peripheral perfusion Skin:      Warm and dry; no rash Neuro: alert and oriented x 3 Psych: normal mood and affect   Data Reviewed: Imaging: Chest x-ray 11/28/2016-mild continue still look forward he got labs recently at his right base subsegmental atelectasis.  Borderline cardiomegaly.  Chest x-ray 05/18/2020- no active cardiopulmonary disease   Modified barium swallow 09/15/2020-no aspiration  Chest x-ray 02/03/2023-no active cardiopulmonary disease, no radio opaque foreign body  CT chest 02/22/2023-curvilinear foreign body in the right mainstem bronchus. I have reviewed the images personally.  PFTs: 06/13/2019 FVC 3.24 [77%], FEV1 2.06 [68%], F/F 63, TLC 7.50 [96%], DLCO 25.17 [101%] Moderate obstructive airway disease with air trapping and bronchodilator response.  ACT score  07/05/2020- 25 12/31/2021- 24  Labs: CBC 10/02/2020-WBC  4.6, eos 2.2%, absolute eosinophil count 101 IgE 10/02/2020-527  Assessment:- Asthma Reports hoarseness and increased coughing after using Breo inhaler. Discussed the possibility of switching back to Symbicort, which may be less likely to cause hoarseness, especially when used with a spacer. -Switch from Breo to Symbicort, using it twice a day with a spacer. -If Symbicort is not effective or cost becomes an issue, patient can contact the office to switch back to [facility].  Discussed ENT evaluation for persistent hoarseness but he would like to hold off for now  Aspiration of dental crown No current symptoms following bronchoscopic removal of the crown in October. -Order a follow-up chest scan to ensure no scarring has occurred. -Schedule follow-up appointment in six months.   GERD, LPR Follows with [facility] Continue PPI   Plan/Recommendations: Symbicort. Continue Singulair CT chest  Chilton Greathouse MD [facility] 06/26/2023, 1:08 PM  CC: Geoffry Paradise, MD

## 2023-06-27 ENCOUNTER — Telehealth: Payer: Self-pay | Admitting: Pulmonary Disease

## 2023-06-27 NOTE — Telephone Encounter (Signed)
PT calling about inhaler sent in yesterday. Symbicort. It says to take 1 puff twice a day but he had been using it 1 time per day. He wonders why Dr. Isaiah Serge changed it. Please call to advise @ [phone]

## 2023-06-28 NOTE — Telephone Encounter (Signed)
Patient checking on message for inhaler. Patient phone number is [phone].

## 2023-06-28 NOTE — Telephone Encounter (Signed)
PT calling again with confusion with his inhaler dosage. Please ask nurse to call him with answers to his questions. He is worrying.

## 2023-06-29 NOTE — Telephone Encounter (Signed)
Called and spoke with patient's wife, Elease Hashimoto, patient was not available at the time of my call.  She states that the inhaler they received from the pharmacy was [facility] and it is 160 mcg and his last inhaler was 80 mcg.  She wanted to make sure this was the right dosage.  She also states that the instructions state not to use anything with this inhaler and they want to know if it is ok to use the spacer with this inhaler.  She says it is not shaped like the Symbicort.  Advised that I would send a message to Dr. Isaiah Serge and once we hear back from him we will call them back, I advised her it would be today.  She verbalized understanding.  Dr. Isaiah Serge, please advise regarding dosage of Brenya and if a spacer can be used with it.  Thank you.

## 2023-06-29 NOTE — Telephone Encounter (Signed)
Patient checking on message for inhaler. Patient was prescribed Breyna. Patient phone number is [phone].

## 2023-06-30 NOTE — Telephone Encounter (Signed)
Dr. Isaiah Serge called and spoke with patient.  Nothing further needed.

## 2023-06-30 NOTE — Telephone Encounter (Signed)
Called and spoke with patient, apologized for his questions with the inhaler not being resolved yesterday and I was not aware the the message was not addressed by the provider yesterday.  I advised him that I sent a message to Dr. Isaiah Serge and requested that the message from yesterday be addressed so the patient would know what to do and how to move forward.  I advised him someone would call him back as soon as we receive a message back from him with his recommendations regarding his inhaler.  He verbalized understanding.

## 2023-06-30 NOTE — Telephone Encounter (Signed)
Patient checking on message for inhaler. Patient phone number is [phone].

## 2023-06-30 NOTE — Telephone Encounter (Signed)
I called and spoke with the patient. He got the generic symbicort which does not fit the spacer. Instructed to take 1 puff twice daily for now. Nothing further needed

## 2023-07-03 ENCOUNTER — Ambulatory Visit: Payer: PPO | Admitting: Pulmonary Disease

## 2023-07-05 DIAGNOSIS — M48062 Spinal stenosis, lumbar region with neurogenic claudication: Secondary | ICD-10-CM | POA: Diagnosis not present

## 2023-07-05 DIAGNOSIS — M5416 Radiculopathy, lumbar region: Secondary | ICD-10-CM | POA: Diagnosis not present

## 2023-07-06 DIAGNOSIS — H9193 Unspecified hearing loss, bilateral: Secondary | ICD-10-CM | POA: Diagnosis not present

## 2023-07-06 DIAGNOSIS — H9313 Tinnitus, bilateral: Secondary | ICD-10-CM | POA: Diagnosis not present

## 2023-07-06 DIAGNOSIS — R490 Dysphonia: Secondary | ICD-10-CM | POA: Diagnosis not present

## 2023-07-13 DIAGNOSIS — M5416 Radiculopathy, lumbar region: Secondary | ICD-10-CM | POA: Diagnosis not present

## 2023-07-20 ENCOUNTER — Ambulatory Visit
Admission: RE | Admit: 2023-07-20 | Discharge: 2023-07-20 | Disposition: A | Discharge status: Home / Self Care | Payer: HMO | Source: Ambulatory Visit | Attending: Pulmonary Disease

## 2023-07-20 DIAGNOSIS — J439 Emphysema, unspecified: Secondary | ICD-10-CM | POA: Diagnosis not present

## 2023-07-20 DIAGNOSIS — I2510 Atherosclerotic heart disease of native coronary artery without angina pectoris: Secondary | ICD-10-CM | POA: Diagnosis not present

## 2023-07-20 DIAGNOSIS — T17908S Unspecified foreign body in respiratory tract, part unspecified causing other injury, sequela: Secondary | ICD-10-CM

## 2023-07-20 DIAGNOSIS — R0600 Dyspnea, unspecified: Secondary | ICD-10-CM | POA: Diagnosis not present

## 2023-07-20 DIAGNOSIS — I700 Atherosclerosis of aorta: Secondary | ICD-10-CM | POA: Diagnosis not present

## 2023-08-01 DIAGNOSIS — M5416 Radiculopathy, lumbar region: Secondary | ICD-10-CM | POA: Diagnosis not present

## 2023-08-02 ENCOUNTER — Other Ambulatory Visit: Payer: Self-pay

## 2023-08-02 ENCOUNTER — Inpatient Hospital Stay (HOSPITAL_COMMUNITY)
Admission: EM | Admit: 2023-08-02 | Discharge: 2023-08-04 | DRG: 041 | Disposition: A | Discharge status: Home / Self Care | Payer: HMO | Attending: Family Medicine | Admitting: Family Medicine

## 2023-08-02 ENCOUNTER — Emergency Department (HOSPITAL_COMMUNITY): Payer: HMO

## 2023-08-02 ENCOUNTER — Encounter (HOSPITAL_COMMUNITY): Payer: Self-pay

## 2023-08-02 ENCOUNTER — Ambulatory Visit
Admission: EM | Admit: 2023-08-02 | Discharge: 2023-08-02 | Disposition: A | Discharge status: Home / Self Care | Payer: HMO | Attending: Nurse Practitioner | Admitting: Nurse Practitioner

## 2023-08-02 DIAGNOSIS — E785 Hyperlipidemia, unspecified: Secondary | ICD-10-CM | POA: Diagnosis present

## 2023-08-02 DIAGNOSIS — S0083XA Contusion of other part of head, initial encounter: Secondary | ICD-10-CM | POA: Diagnosis present

## 2023-08-02 DIAGNOSIS — Z85828 Personal history of other malignant neoplasm of skin: Secondary | ICD-10-CM

## 2023-08-02 DIAGNOSIS — R29704 NIHSS score 4: Secondary | ICD-10-CM | POA: Diagnosis present

## 2023-08-02 DIAGNOSIS — D696 Thrombocytopenia, unspecified: Secondary | ICD-10-CM

## 2023-08-02 DIAGNOSIS — Y92008 Other place in unspecified non-institutional (private) residence as the place of occurrence of the external cause: Secondary | ICD-10-CM

## 2023-08-02 DIAGNOSIS — I63511 Cerebral infarction due to unspecified occlusion or stenosis of right middle cerebral artery: Secondary | ICD-10-CM | POA: Diagnosis not present

## 2023-08-02 DIAGNOSIS — Z886 Allergy status to analgesic agent status: Secondary | ICD-10-CM

## 2023-08-02 DIAGNOSIS — I639 Cerebral infarction, unspecified: Principal | ICD-10-CM | POA: Diagnosis present

## 2023-08-02 DIAGNOSIS — H53462 Homonymous bilateral field defects, left side: Secondary | ICD-10-CM | POA: Diagnosis present

## 2023-08-02 DIAGNOSIS — K219 Gastro-esophageal reflux disease without esophagitis: Secondary | ICD-10-CM | POA: Diagnosis present

## 2023-08-02 DIAGNOSIS — I5032 Chronic diastolic (congestive) heart failure: Secondary | ICD-10-CM | POA: Diagnosis present

## 2023-08-02 DIAGNOSIS — W101XXA Fall (on)(from) sidewalk curb, initial encounter: Secondary | ICD-10-CM | POA: Diagnosis present

## 2023-08-02 DIAGNOSIS — I110 Hypertensive heart disease with heart failure: Secondary | ICD-10-CM | POA: Diagnosis present

## 2023-08-02 DIAGNOSIS — I6782 Cerebral ischemia: Secondary | ICD-10-CM | POA: Diagnosis not present

## 2023-08-02 DIAGNOSIS — R42 Dizziness and giddiness: Secondary | ICD-10-CM

## 2023-08-02 DIAGNOSIS — I6389 Other cerebral infarction: Secondary | ICD-10-CM | POA: Diagnosis not present

## 2023-08-02 DIAGNOSIS — R531 Weakness: Secondary | ICD-10-CM | POA: Diagnosis not present

## 2023-08-02 DIAGNOSIS — J45909 Unspecified asthma, uncomplicated: Secondary | ICD-10-CM | POA: Insufficient documentation

## 2023-08-02 DIAGNOSIS — H5340 Unspecified visual field defects: Secondary | ICD-10-CM | POA: Diagnosis not present

## 2023-08-02 DIAGNOSIS — Z87891 Personal history of nicotine dependence: Secondary | ICD-10-CM

## 2023-08-02 DIAGNOSIS — Z7982 Long term (current) use of aspirin: Secondary | ICD-10-CM

## 2023-08-02 DIAGNOSIS — I63411 Cerebral infarction due to embolism of right middle cerebral artery: Secondary | ICD-10-CM | POA: Diagnosis not present

## 2023-08-02 DIAGNOSIS — Z7951 Long term (current) use of inhaled steroids: Secondary | ICD-10-CM

## 2023-08-02 DIAGNOSIS — Z833 Family history of diabetes mellitus: Secondary | ICD-10-CM

## 2023-08-02 DIAGNOSIS — Z882 Allergy status to sulfonamides status: Secondary | ICD-10-CM

## 2023-08-02 DIAGNOSIS — R112 Nausea with vomiting, unspecified: Secondary | ICD-10-CM | POA: Diagnosis present

## 2023-08-02 DIAGNOSIS — H905 Unspecified sensorineural hearing loss: Secondary | ICD-10-CM | POA: Diagnosis present

## 2023-08-02 DIAGNOSIS — Z885 Allergy status to narcotic agent status: Secondary | ICD-10-CM

## 2023-08-02 DIAGNOSIS — S0093XA Contusion of unspecified part of head, initial encounter: Secondary | ICD-10-CM | POA: Diagnosis not present

## 2023-08-02 DIAGNOSIS — Z8249 Family history of ischemic heart disease and other diseases of the circulatory system: Secondary | ICD-10-CM

## 2023-08-02 DIAGNOSIS — E559 Vitamin D deficiency, unspecified: Secondary | ICD-10-CM | POA: Diagnosis present

## 2023-08-02 DIAGNOSIS — Z7902 Long term (current) use of antithrombotics/antiplatelets: Secondary | ICD-10-CM

## 2023-08-02 DIAGNOSIS — Z79899 Other long term (current) drug therapy: Secondary | ICD-10-CM

## 2023-08-02 DIAGNOSIS — N400 Enlarged prostate without lower urinary tract symptoms: Secondary | ICD-10-CM

## 2023-08-02 DIAGNOSIS — M7981 Nontraumatic hematoma of soft tissue: Secondary | ICD-10-CM | POA: Diagnosis not present

## 2023-08-02 DIAGNOSIS — S0990XA Unspecified injury of head, initial encounter: Secondary | ICD-10-CM | POA: Diagnosis not present

## 2023-08-02 LAB — CBC
HCT: 38.5 % — ABNORMAL LOW (ref 39.0–52.0)
Hemoglobin: 13.1 g/dL (ref 13.0–17.0)
MCH: 31.6 pg (ref 26.0–34.0)
MCHC: 34 g/dL (ref 30.0–36.0)
MCV: 92.8 fL (ref 80.0–100.0)
Platelets: 137 10*3/uL — ABNORMAL LOW (ref 150–400)
RBC: 4.15 MIL/uL — ABNORMAL LOW (ref 4.22–5.81)
RDW: 12.8 % (ref 11.5–15.5)
WBC: 4.1 10*3/uL (ref 4.0–10.5)
nRBC: 0 % (ref 0.0–0.2)

## 2023-08-02 LAB — COMPREHENSIVE METABOLIC PANEL
ALT: 17 U/L (ref 0–44)
AST: 22 U/L (ref 15–41)
Albumin: 4 g/dL (ref 3.5–5.0)
Alkaline Phosphatase: 69 U/L (ref 38–126)
Anion gap: 10 (ref 5–15)
BUN: 20 mg/dL (ref 8–23)
CO2: 22 mmol/L (ref 22–32)
Calcium: 9.3 mg/dL (ref 8.9–10.3)
Chloride: 105 mmol/L (ref 98–111)
Creatinine, Ser: 1.02 mg/dL (ref 0.61–1.24)
GFR, Estimated: >60 mL/min (ref >60)
Glucose, Bld: 152 mg/dL — ABNORMAL HIGH (ref 70–99)
Potassium: 4.2 mmol/L (ref 3.5–5.1)
Sodium: 137 mmol/L (ref 135–145)
Total Bilirubin: 0.8 mg/dL (ref 0.0–1.2)
Total Protein: 6.7 g/dL (ref 6.5–8.1)

## 2023-08-02 LAB — URINALYSIS, ROUTINE W REFLEX MICROSCOPIC
Bilirubin Urine: NEGATIVE
Glucose, UA: NEGATIVE mg/dL
Hgb urine dipstick: NEGATIVE
Ketones, ur: NEGATIVE mg/dL
Leukocytes,Ua: NEGATIVE
Nitrite: NEGATIVE
Protein, ur: NEGATIVE mg/dL
Specific Gravity, Urine: 1.024 (ref 1.005–1.030)
pH: 7 (ref 5.0–8.0)

## 2023-08-02 LAB — ETHANOL: Alcohol, Ethyl (B): <10 mg/dL (ref <10)

## 2023-08-02 LAB — RAPID URINE DRUG SCREEN, HOSP PERFORMED
Amphetamines: NOT DETECTED
Barbiturates: NOT DETECTED
Benzodiazepines: NOT DETECTED
Cocaine: NOT DETECTED
Opiates: NOT DETECTED
Tetrahydrocannabinol: NOT DETECTED

## 2023-08-02 LAB — I-STAT CHEM 8, ED
BUN: 19 mg/dL (ref 8–23)
Calcium, Ion: 1.2 mmol/L (ref 1.15–1.40)
Chloride: 106 mmol/L (ref 98–111)
Creatinine, Ser: 1.1 mg/dL (ref 0.61–1.24)
Glucose, Bld: 148 mg/dL — ABNORMAL HIGH (ref 70–99)
HCT: 39 % (ref 39.0–52.0)
Hemoglobin: 13.3 g/dL (ref 13.0–17.0)
Potassium: 4.2 mmol/L (ref 3.5–5.1)
Sodium: 140 mmol/L (ref 135–145)
TCO2: 23 mmol/L (ref 22–32)

## 2023-08-02 LAB — DIFFERENTIAL
Abs Immature Granulocytes: 0.02 10*3/uL (ref 0.00–0.07)
Basophils Absolute: 0 10*3/uL (ref 0.0–0.1)
Basophils Relative: 1 %
Eosinophils Absolute: 0 10*3/uL (ref 0.0–0.5)
Eosinophils Relative: 1 %
Immature Granulocytes: 1 %
Lymphocytes Relative: 17 %
Lymphs Abs: 0.7 10*3/uL (ref 0.7–4.0)
Monocytes Absolute: 0.2 10*3/uL (ref 0.1–1.0)
Monocytes Relative: 6 %
Neutro Abs: 3.2 10*3/uL (ref 1.7–7.7)
Neutrophils Relative %: 74 %

## 2023-08-02 LAB — POCT FASTING CBG KUC MANUAL ENTRY: POCT Glucose (KUC): 134 mg/dL — AB (ref 70–99)

## 2023-08-02 LAB — APTT: aPTT: 24 s (ref 24–36)

## 2023-08-02 LAB — PROTIME-INR
INR: 1.1 (ref 0.8–1.2)
Prothrombin Time: 14.1 s (ref 11.4–15.2)

## 2023-08-02 MED ORDER — GADOBUTROL 1 MMOL/ML IV SOLN
8.0000 mL | Freq: Once | INTRAVENOUS | Status: AC | PRN
  Administered 2023-08-02: 8 mL via INTRAVENOUS

## 2023-08-02 NOTE — ED Triage Notes (Signed)
 Pt presents with a bruise and lump to the lt side of his head. Pt wife states he was walking outside and hit his head on the key pad at the entrance door. Pt was feeling dizzy when he fell going into the garage.

## 2023-08-02 NOTE — ED Notes (Signed)
 Patient is being discharged from the Urgent Care and sent to the Emergency Department via POV . Per Lurline Idol NP, patient is in need of higher level of care due to dizziness, fall, head injury. Patient is aware and verbalizes understanding of plan of care.  Vitals:   08/02/23 1715  BP: 137/78  Pulse: (!) 55  Resp: 16  Temp: (!) 97.5 F (36.4 C)  SpO2: 95%

## 2023-08-02 NOTE — ED Provider Notes (Signed)
 [facility]    CSN: [id number] Arrival date & time: 08/02/23  1656      History   Chief Complaint Chief Complaint  Patient presents with   Head Injury   Fall    HPI Erik Gomez is a 83 y.o. male.    Subjective:  Erik Gomez is an 83 year old male presenting for evaluation of an injury. The patient reports that while walking around his yard this morning enjoying the sunshine, he became dizzy upon entering the garage and lost his balance. This caused him to hit the left side of his head against the garage door keypad. He did not fall to the ground, and the event was unwitnessed. His wife found him sitting in a chair with a large "goose egg" on his head. The patient was able to tell his wife what happened and appeared alert and not confused.  His wife notes that he has been experiencing frequent dizziness lately, especially when standing up from a seated position. Additionally, she observed that he has been sleeping on and off today, which is unusual for him. The patient has an obvious hematoma to his left forehead, which he has been treating with ice and Tylenol prior to arrival. He reports feeling "pretty good" but admits to occasional weakness.  The patient denies any history of vertigo or diabetes, has not had any recent illness, and currently denies dizziness, headache, blurry vision, light sensitivity, chest pain, shortness of breath, palpitations, neck pain, seizures, paresthesias of the face or extremities, nausea, or vomiting.           Past Medical History:  Diagnosis Date   Aortic atherosclerosis (HCC) 02/22/2023   Recvd call (03/07/23) from [facility] Radiology with results - seen on 02/22/23 CT chest Xray   Asthma, intrinsic    Cataract    per pt "beginning of a cataract" - bilateral   Chronic headache    GERD (gastroesophageal reflux disease)    w/ LPR   Hiatal hernia    Internal hemorrhoid    Osteoarthritis 02/05/2021   Peyronie disease     Pneumonia    x 1 as a child   Vitamin D deficiency     Patient Active Problem List   Diagnosis Date Noted   Foreign body aspiration, sequela 03/09/2023   Moderate persistent asthma without complication 07/05/2021   Lung field abnormal finding on examination 11/28/2016   Acute URI 05/24/2013   Rhinitis, allergic 10/03/2011   Jaw pain 06/09/2011   NONSPECIFIC ABN FINDNG RAD&OTH EXAM BILARY TRCT 05/19/2010   HEMORRHOIDS 05/10/2010   Dysphagia 05/10/2010   INTERNAL HEMORRHOIDS 04/17/2008   Intrinsic asthma 11/28/2007   HOARSENESS 11/28/2007   GERD 08/07/2007   HIATAL HERNIA 08/07/2007   PEYRONIE'S DISEASE 08/07/2007   ARTHRITIS 08/07/2007   HEADACHE, CHRONIC 08/07/2007    Past Surgical History:  Procedure Laterality Date   CATARACT EXTRACTION, BILATERAL Bilateral 2018   COLONOSCOPY     FINGER SURGERY     middle finger of right hand   FOREIGN BODY REMOVAL  03/09/2023   Procedure: FOREIGN BODY REMOVAL;  Surgeon: Chilton Greathouse, MD;  Location: [facility];  Service: Cardiopulmonary;;  crown   KNEE SURGERY     Left   Peyronie's Disease surgery  2001   SHOULDER SURGERY Right    at [facility] [place]   SKIN CANCER EXCISION     Right leg   VIDEO BRONCHOSCOPY N/A 03/09/2023   Procedure: VIDEO BRONCHOSCOPY WITH FLUORO;  Surgeon:  Chilton Greathouse, MD;  Location: [facility];  Service: Cardiopulmonary;  Laterality: N/A;       Home Medications    Prior to Admission medications   Medication Sig Start Date End Date Taking? Authorizing Provider  Acetaminophen (TYLENOL PO) Per bottle as needed    [provider]  Albuterol Sulfate (PROAIR RESPICLICK) 108 (90 Base) MCG/ACT AEPB Inhale 2 puffs into the lungs every 6 (six) hours as needed. 12/02/21   Mannam, Colbert Coyer, MD  Biotin 1000 MCG tablet Take 1,000 mcg by mouth daily.    [provider]  budesonide-formoterol (SYMBICORT) 160-4.5 MCG/ACT inhaler Inhale 2 puffs into the lungs 2 (two) times daily.  06/26/23 06/25/24  Chilton Greathouse, MD  cetirizine (ZYRTEC) 10 MG tablet Take 10 mg by mouth at bedtime.    [provider]  montelukast (SINGULAIR) 10 MG tablet Take 1 tablet (10 mg total) by mouth daily. 01/20/23 05/01/24  Chilton Greathouse, MD  Multiple Vitamins-Minerals (LUTEIN-ZEAXANTHIN) TABS Take 2 mg by mouth daily.    [provider]  omeprazole (PRILOSEC) 40 MG capsule TAKE 1 CAPSULE BY MOUTH TWICE DAILY . 11/24/22   Meryl Dare, MD  tamsulosin (FLOMAX) 0.4 MG CAPS capsule Take 0.4 mg by mouth daily.    [provider]  vitamin D, CHOLECALCIFEROL, 400 UNITS tablet Take 1,000 Units by mouth daily.    [provider]    Family History Family History  Problem Relation Age of Onset   Heart disease Mother    Heart disease Father    Diabetes Father    Diabetes Sister    Diabetes Brother    Colon cancer Neg Hx    Esophageal cancer Neg Hx    Stomach cancer Neg Hx    Rectal cancer Neg Hx     Social History Social History   Tobacco Use   Smoking status: Former    Current packs/day: 0.00    Average packs/day: 2.0 packs/day for 6.0 years (12.0 ttl pk-yrs)    Types: Cigarettes    Start date: 06/07/1959    Quit date: 06/06/1965    Years since quitting: 58.1   Smokeless tobacco: Never   Tobacco comments:    quit 45 yrs ago   Vaping Use   Vaping status: Never Used  Substance Use Topics   Alcohol use: Yes    Alcohol/week: 1.0 - 2.0 standard drink of alcohol    Types: 1 - 2 Glasses of wine per week    Comment: wine   Drug use: No     Allergies   Codeine, Meloxicam, and Pantoprazole sodium   Review of Systems Review of Systems  Constitutional:  Negative for activity change, appetite change, chills, diaphoresis, fatigue, fever and unexpected weight change.  Respiratory:  Negative for shortness of breath.   Cardiovascular:  Negative for chest pain and palpitations.  Gastrointestinal:  Negative for nausea and vomiting.  Musculoskeletal:   Negative for neck pain.  Skin:  Positive for wound.  Neurological:  Positive for dizziness. Negative for tremors, seizures, syncope, speech difficulty, weakness, numbness and headaches.  Psychiatric/Behavioral:  Negative for confusion and decreased concentration.   All other systems reviewed and are negative.    Physical Exam Triage Vital Signs ED Triage Vitals  Encounter Vitals Group     BP 08/02/23 1715 137/78     Systolic BP Percentile --      Diastolic BP Percentile --      Pulse Rate 08/02/23 1715 (!) 55     Resp  08/02/23 1715 16     Temp 08/02/23 1715 (!) 97.5 F (36.4 C)     Temp Source 08/02/23 1715 Oral     SpO2 08/02/23 1715 95 %     Weight --      Height --      Head Circumference --      Peak Flow --      Pain Score 08/02/23 1714 0     Pain Loc --      Pain Education --      Exclude from Growth Chart --    Orthostatic VS for the past 24 hrs:  BP- Lying Pulse- Lying BP- Sitting Pulse- Sitting BP- Standing at 0 minutes Pulse- Standing at 0 minutes  08/02/23 1812 139/70 57 132/75 65 142/77 64    Updated Vital Signs BP 137/78 (BP Location: Right Arm)   Pulse (!) 55   Temp (!) 97.5 F (36.4 C) (Oral)   Resp 16   SpO2 95%   Visual Acuity Right Eye Distance:   Left Eye Distance:   Bilateral Distance:    Right Eye Near:   Left Eye Near:    Bilateral Near:     Physical Exam Constitutional:      General: He is awake. He is not in acute distress.    Appearance: Normal appearance. He is well-developed and well-groomed. He is not ill-appearing, toxic-appearing or diaphoretic.  HENT:     Head: Normocephalic. No raccoon eyes, Battle's sign, right periorbital erythema or left periorbital erythema.      Right Ear: Tympanic membrane, ear canal and external ear normal.     Left Ear: Tympanic membrane, ear canal and external ear normal.     Ears:     Comments: No fluid noted from the ears     Nose: Nose normal. No signs of injury or nasal tenderness.      Comments: No fluid noted from the nose     Mouth/Throat:     Lips: Pink.     Mouth: Mucous membranes are moist.     Comments: No dental injury  Eyes:     General: Lids are normal. Vision grossly intact. No visual field deficit.    Extraocular Movements: Extraocular movements intact.     Right eye: No nystagmus.     Left eye: No nystagmus.     Conjunctiva/sclera: Conjunctivae normal.     Pupils: Pupils are equal, round, and reactive to light.  Cardiovascular:     Rate and Rhythm: Normal rate and regular rhythm.     Pulses: Normal pulses.     Heart sounds: Normal heart sounds.  Pulmonary:     Effort: Pulmonary effort is normal.     Breath sounds: Normal breath sounds.  Abdominal:     Palpations: Abdomen is soft.  Musculoskeletal:        General: Normal range of motion.     Cervical back: Full passive range of motion without pain, normal range of motion and neck supple. No tenderness.  Skin:    General: Skin is warm and dry.  Neurological:     General: No focal deficit present.     Mental Status: He is alert and oriented to person, place, and time.     GCS: GCS eye subscore is 4. GCS verbal subscore is 5. GCS motor subscore is 6.     Cranial Nerves: Cranial nerves 2-12 are intact. No cranial nerve deficit.     Sensory: Sensation is intact.  Motor: Motor function is intact.     Coordination: Coordination is intact.     Gait: Gait is intact.      UC Treatments / Results  Labs (all labs ordered are listed, but only abnormal results are displayed) Labs Reviewed  POCT FASTING CBG [facility] MANUAL ENTRY - Abnormal; Notable for the following components:      Result Value   POCT Glucose ([facility]) 134 (*)    All other components within normal limits    EKG   Radiology No results found.  Procedures Procedures (including critical care time)  Medications Ordered in UC Medications - No data to display  Initial Impression / Assessment and Plan / UC Course  I have reviewed the  triage vital signs and the nursing notes.  Pertinent labs & imaging results that were available during my care of the patient were reviewed by me and considered in my medical decision making (see chart for details).    83 year old male with a medical history significant for GERD, aortic arteriosclerosis, vitamin D deficiency, and osteoarthritis, who presents with a hematoma to his left forehead following an injury this morning. While walking into his garage, he became dizzy, lost his balance, and struck the left side of his head against the garage door keypad. He did not fall to the ground and is not currently experiencing dizziness, headache, blurred vision, light sensitivity, chest pain, neck pain, shortness of breath, palpitations, neck pain, seizures, paresthesias of the face or extremities, nausea, or vomiting. He denies a history of vertigo or diabetes. His wife reports that he has been experiencing frequent dizziness, especially when standing up from a seated position, and he acknowledges feeling weak at times but otherwise states that he feels "pretty good."  Upon examination, the patient is alert and oriented, afebrile, and has normal blood pressure. His heart rate was initially 55 beats per minute. Orthostatic vital signs were unremarkable, and his capillary blood glucose was 134 mg/dL. An EKG revealed sinus rhythm with occasional premature ventricular contractions (PVCs), but it was otherwise normal.  Given the patient's age, a CT of the head is indicated to rule out any intracranial abnormalities or other potential causes for his dizziness. The patient is stable for private vehicle transport, and he is agreeable to the plan. His wife will drive him to the ER of their choosing.   Documentation was completed with the aid of voice recognition software. Transcription may contain typographical errors. Final Clinical Impressions(s) / UC Diagnoses   Final diagnoses:  Traumatic hematoma of  forehead, initial encounter  Weakness with dizziness   Discharge Instructions   None    ED Prescriptions   None    PDMP not reviewed this encounter.   Lurline Idol, [place] 08/02/23 [phone]

## 2023-08-02 NOTE — ED Triage Notes (Signed)
 Sent by UC for ct scan. Pt hit head around 1000 today, states he tripped when going in the house. Hematoma to left side of head above eyebrow. Pt wife states pt has been sleepier than usual and c/o intermittent dizziness.

## 2023-08-02 NOTE — ED Notes (Signed)
 Pt back from MRI

## 2023-08-02 NOTE — ED Provider Notes (Signed)
 [place] [facility] Provider Note   CSN: [id number] Arrival date & time: 08/02/23  1950     History  Chief Complaint  Patient presents with   Head Injury    Erik Gomez is a 83 y.o. male.  Patient is an 83 year old male with past medical history of GERD presenting to the emergency department after a head injury.  Patient states he was coming in from the garage when he became dizzy and hit his head on the wall.  He denies any loss of consciousness.  He states he has only had a mild headache today.  He denies any associated numbness or weakness, nausea or vomiting.  He denies any blood thinner use.  He was seen at urgent care today who recommended he come to the ER for further evaluation.  The history is provided by the patient.  Head Injury      Home Medications Prior to Admission medications   Medication Sig Start Date End Date Taking? Authorizing Provider  Acetaminophen (TYLENOL PO) Per bottle as needed    [provider]  Albuterol Sulfate (PROAIR RESPICLICK) 108 (90 Base) MCG/ACT AEPB Inhale 2 puffs into the lungs every 6 (six) hours as needed. 12/02/21   Mannam, Colbert Coyer, MD  Biotin 1000 MCG tablet Take 1,000 mcg by mouth daily.    [provider]  budesonide-formoterol (SYMBICORT) 160-4.5 MCG/ACT inhaler Inhale 2 puffs into the lungs 2 (two) times daily. 06/26/23 06/25/24  Chilton Greathouse, MD  cetirizine (ZYRTEC) 10 MG tablet Take 10 mg by mouth at bedtime.    [provider]  montelukast (SINGULAIR) 10 MG tablet Take 1 tablet (10 mg total) by mouth daily. 01/20/23 05/01/24  Chilton Greathouse, MD  Multiple Vitamins-Minerals (LUTEIN-ZEAXANTHIN) TABS Take 2 mg by mouth daily.    [provider]  omeprazole (PRILOSEC) 40 MG capsule TAKE 1 CAPSULE BY MOUTH TWICE DAILY . 11/24/22   Meryl Dare, MD  tamsulosin (FLOMAX) 0.4 MG CAPS capsule Take 0.4 mg by mouth daily.    [provider]  vitamin D,  CHOLECALCIFEROL, 400 UNITS tablet Take 1,000 Units by mouth daily.    [provider]      Allergies    Codeine, Meloxicam, Pantoprazole sodium, and Sulfa antibiotics    Review of Systems   Review of Systems  Physical Exam Updated Vital Signs BP (!) 143/71   Pulse 60   Temp (!) 97.3 F (36.3 C) (Oral)   Resp 14   Ht 5\' 10"  (1.778 m)   Wt 81.6 kg   SpO2 97%   BMI 25.83 kg/m  Physical Exam Vitals and nursing note reviewed.  Constitutional:      General: He is not in acute distress.    Appearance: Normal appearance.  HENT:     Head: Normocephalic.     Comments: Left-sided forehead hematoma with small nonbleeding abrasions    Nose: Nose normal.     Mouth/Throat:     Mouth: Mucous membranes are moist.     Pharynx: Oropharynx is clear.  Eyes:     Extraocular Movements: Extraocular movements intact.     Conjunctiva/sclera: Conjunctivae normal.     Pupils: Pupils are equal, round, and reactive to light.  Cardiovascular:     Rate and Rhythm: Normal rate and regular rhythm.     Heart sounds: Normal heart sounds.  Pulmonary:     Effort: Pulmonary effort is normal.     Breath sounds: Normal breath sounds.  Abdominal:     General: Abdomen is flat.     Palpations: Abdomen is soft.     Tenderness: There is no abdominal tenderness.  Musculoskeletal:        General: Normal range of motion.     Cervical back: Normal range of motion.  Skin:    General: Skin is warm and dry.  Neurological:     General: No focal deficit present.     Mental Status: He is alert and oriented to person, place, and time.     Cranial Nerves: No cranial nerve deficit.     Sensory: No sensory deficit.     Motor: No weakness.     Coordination: Coordination normal.     Comments: No drift in all 4 extremities, normal speech  Psychiatric:        Mood and Affect: Mood normal.        Behavior: Behavior normal.     ED Results / Procedures / Treatments   Labs (all labs ordered are listed, but  only abnormal results are displayed) Labs Reviewed  CBC - Abnormal; Notable for the following components:      Result Value   RBC 4.15 (*)    HCT 38.5 (*)    Platelets 137 (*)    All other components within normal limits  COMPREHENSIVE METABOLIC PANEL - Abnormal; Notable for the following components:   Glucose, Bld 152 (*)    All other components within normal limits  I-STAT CHEM 8, ED - Abnormal; Notable for the following components:   Glucose, Bld 148 (*)    All other components within normal limits  ETHANOL  PROTIME-INR  APTT  DIFFERENTIAL  URINALYSIS, ROUTINE W REFLEX MICROSCOPIC  RAPID URINE DRUG SCREEN, HOSP PERFORMED    EKG EKG Interpretation Date/Time:  Wednesday August 02 2023 20:57:39 EST Ventricular Rate:  59 PR Interval:  153 QRS Duration:  100 QT Interval:  397 QTC Calculation: 394 R Axis:   31  Text Interpretation: Sinus rhythm Since last tracing of earlier today PVCs resolved, otherwise unchanged Confirmed by Elayne Snare ([id number]) on 08/02/2023 9:15:21 PM  Radiology CT Head Wo Contrast Result Date: 08/02/2023 CLINICAL DATA:  Head trauma, moderate to severe. Patient struck head around 10 o'clock today after trip and fall injury. Hematoma to the left side of the head above the eyebrow. Sleepier than usual and intermittent dizziness. EXAM: CT HEAD WITHOUT CONTRAST TECHNIQUE: Contiguous axial images were obtained from the base of the skull through the vertex without intravenous contrast. RADIATION DOSE REDUCTION: This exam was performed according to the departmental dose-optimization program which includes automated exposure control, adjustment of the mA and/or kV according to patient size and/or use of iterative reconstruction technique. COMPARISON:  CT paranasal sinuses 06/10/2011 FINDINGS: Brain: Mild diffuse cerebral atrophy. Mild ventricular dilatation consistent with central atrophy. Cavum septum pellucidum. Poorly defined low-attenuation changes are  demonstrated in the right parietal lobe associated with loss of gray-white differentiation and sulcal effacement in this area. This is most likely to represent acute infarct but underlying mass lesion could also have this appearance. MRI is suggested for further characterization. No abnormal extra-axial fluid collections. Basal cisterns are not effaced. No acute intracranial hemorrhage. Vascular: No hyperdense vessel or unexpected calcification. Skull: Normal. Negative for fracture or focal lesion. Sinuses/Orbits: Mucosal thickening in the paranasal sinuses. No acute air-fluid levels. Mastoid air cells are clear. Other: None. IMPRESSION: 1. Vague low-attenuation with associated sulcal effacement and loss of gray-white differentiation in the right parietal lobe.  This is likely acute infarct in the distribution of the middle cerebral artery. A mass lesion could also possibly have this appearance. MRI is recommended. 2. Chronic atrophy and small vessel ischemic changes. No acute intracranial hemorrhage. Electronically Signed   By: Burman Nieves M.D.   On: 08/02/2023 20:35    Procedures Procedures    Medications Ordered in ED Medications  gadobutrol (GADAVIST) 1 MMOL/ML injection 8 mL (8 mLs Intravenous Contrast Given 08/02/23 2225)    ED Course/ Medical Decision Making/ A&P Clinical Course as of 08/02/23 2348  Wed Aug 02, 2023  2347 Patient signed out to Dr. Nicanor Alcon pending MRI read. [VK]    Clinical Course User Index [VK] Rexford Maus, DO                                 Medical Decision Making This patient presents to the ED with chief complaint(s) of head injury with pertinent past medical history of GERD, spinal stenosis which further complicates the presenting complaint. The complaint involves an extensive differential diagnosis and also carries with it a high risk of complications and morbidity.    The differential diagnosis includes ICH, mass effect, arrhythmia, anemia,  dehydration, electrolyte abnormality, near syncopal fall  Additional history obtained: Additional history obtained from spouse Records reviewed outpatient urgent care records  ED Course and Reassessment: On patient's arrival he was hemodynamically stable in no acute distress.  He was initially evaluated by provider in triage and had EKG, labs and head CT performed.  EKG showed normal sinus rhythm without acute ischemic changes and labs are within normal range.  Head CT did show concern for possible acute CVA versus possible mass effect and was recommended MRI.  He has no focal neurologic deficits on his exam.  MRI with and without contrast is ordered and he will be closely reassessed.  Independent labs interpretation:  The following labs were independently interpreted: within normal range  Independent visualization of imaging: - I independently visualized the following imaging with scope of interpretation limited to determining acute life threatening conditions related to emergency care: CTH, which revealed concern for acute infarct vs mass            Final Clinical Impression(s) / ED Diagnoses Final diagnoses:  None    Rx / DC Orders ED Discharge Orders     None         Rexford Maus, DO 08/02/23 2348

## 2023-08-02 NOTE — ED Notes (Signed)
 Spoke to pts daughter, she confirmed she will be picking up pt and wife for pt to have a CT scan done at ED.

## 2023-08-02 NOTE — ED Notes (Signed)
 Pt states he is not feeling dizzy during OVS check

## 2023-08-02 NOTE — Discharge Instructions (Addendum)
 You were seen today for a head injury that occurred after experiencing an episode of dizziness. Given your age, a CT of the head is recommended to rule out any intracranial abnormalities or other potential causes for your recent episodes of dizziness. You are stable to transport yourself to the emergency department for further evaluation as long as your wife drives you. When you check in, please inform them that you were sent from the urgent care so they can review my notes and continue your care.

## 2023-08-02 NOTE — ED Provider Triage Note (Signed)
 Emergency Medicine Provider Triage Evaluation Note  Erik Gomez , a 83 y.o. male  was evaluated in triage.  Pt complains of head injury.  Reports he fell over this morning, tripped over a curb, in his driveway.  States that he hit the left side of his head on the ground.  Denies blood thinners, loss of consciousness.  Went to urgent care but redirected to ED.  Denies photophobia.  Does endorse slight nausea throughout the day but was able to eat lunch.  Review of Systems  Positive:  Negative:   Physical Exam  BP (!) 143/71   Pulse 60   Temp (!) 97.3 F (36.3 C) (Oral)   Resp 16   Ht 5\' 10"  (1.778 m)   Wt 81.6 kg   SpO2 98%   BMI 25.83 kg/m  Gen:   Awake, no distress   Resp:  Normal effort  MSK:   Moves extremities without difficulty  Other:  No focal neurodeficits.  Medical Decision Making  Medically screening exam initiated at 8:16 PM.  Appropriate orders placed.  Amol M Weiler was informed that the remainder of the evaluation will be completed by another provider, this initial triage assessment does not replace that evaluation, and the importance of remaining in the ED until their evaluation is complete.     Al Decant, PA-C 08/02/23 2017

## 2023-08-03 ENCOUNTER — Encounter (HOSPITAL_COMMUNITY): Payer: Self-pay

## 2023-08-03 ENCOUNTER — Inpatient Hospital Stay (HOSPITAL_COMMUNITY): Payer: HMO

## 2023-08-03 ENCOUNTER — Emergency Department (HOSPITAL_COMMUNITY): Payer: HMO

## 2023-08-03 DIAGNOSIS — R42 Dizziness and giddiness: Secondary | ICD-10-CM | POA: Diagnosis present

## 2023-08-03 DIAGNOSIS — J45909 Unspecified asthma, uncomplicated: Secondary | ICD-10-CM | POA: Diagnosis not present

## 2023-08-03 DIAGNOSIS — I6389 Other cerebral infarction: Secondary | ICD-10-CM

## 2023-08-03 DIAGNOSIS — N400 Enlarged prostate without lower urinary tract symptoms: Secondary | ICD-10-CM

## 2023-08-03 DIAGNOSIS — I77810 Thoracic aortic ectasia: Secondary | ICD-10-CM | POA: Diagnosis not present

## 2023-08-03 DIAGNOSIS — I639 Cerebral infarction, unspecified: Secondary | ICD-10-CM | POA: Diagnosis not present

## 2023-08-03 DIAGNOSIS — H53462 Homonymous bilateral field defects, left side: Secondary | ICD-10-CM | POA: Diagnosis not present

## 2023-08-03 DIAGNOSIS — I63511 Cerebral infarction due to unspecified occlusion or stenosis of right middle cerebral artery: Secondary | ICD-10-CM | POA: Diagnosis not present

## 2023-08-03 DIAGNOSIS — M7981 Nontraumatic hematoma of soft tissue: Secondary | ICD-10-CM | POA: Diagnosis not present

## 2023-08-03 DIAGNOSIS — Z7902 Long term (current) use of antithrombotics/antiplatelets: Secondary | ICD-10-CM | POA: Diagnosis not present

## 2023-08-03 DIAGNOSIS — S0083XA Contusion of other part of head, initial encounter: Secondary | ICD-10-CM | POA: Diagnosis not present

## 2023-08-03 DIAGNOSIS — I110 Hypertensive heart disease with heart failure: Secondary | ICD-10-CM | POA: Diagnosis not present

## 2023-08-03 DIAGNOSIS — D696 Thrombocytopenia, unspecified: Secondary | ICD-10-CM | POA: Diagnosis not present

## 2023-08-03 DIAGNOSIS — I6782 Cerebral ischemia: Secondary | ICD-10-CM | POA: Diagnosis not present

## 2023-08-03 DIAGNOSIS — I6523 Occlusion and stenosis of bilateral carotid arteries: Secondary | ICD-10-CM | POA: Diagnosis not present

## 2023-08-03 DIAGNOSIS — S0990XA Unspecified injury of head, initial encounter: Secondary | ICD-10-CM | POA: Diagnosis not present

## 2023-08-03 DIAGNOSIS — Z833 Family history of diabetes mellitus: Secondary | ICD-10-CM | POA: Diagnosis not present

## 2023-08-03 DIAGNOSIS — E785 Hyperlipidemia, unspecified: Secondary | ICD-10-CM | POA: Diagnosis not present

## 2023-08-03 DIAGNOSIS — Z85828 Personal history of other malignant neoplasm of skin: Secondary | ICD-10-CM | POA: Diagnosis not present

## 2023-08-03 DIAGNOSIS — Z886 Allergy status to analgesic agent status: Secondary | ICD-10-CM | POA: Diagnosis not present

## 2023-08-03 DIAGNOSIS — Y92008 Other place in unspecified non-institutional (private) residence as the place of occurrence of the external cause: Secondary | ICD-10-CM | POA: Diagnosis not present

## 2023-08-03 DIAGNOSIS — Z87891 Personal history of nicotine dependence: Secondary | ICD-10-CM | POA: Diagnosis not present

## 2023-08-03 DIAGNOSIS — H5340 Unspecified visual field defects: Secondary | ICD-10-CM | POA: Diagnosis not present

## 2023-08-03 DIAGNOSIS — E559 Vitamin D deficiency, unspecified: Secondary | ICD-10-CM | POA: Diagnosis not present

## 2023-08-03 DIAGNOSIS — I63411 Cerebral infarction due to embolism of right middle cerebral artery: Secondary | ICD-10-CM | POA: Diagnosis not present

## 2023-08-03 DIAGNOSIS — Z7951 Long term (current) use of inhaled steroids: Secondary | ICD-10-CM | POA: Diagnosis not present

## 2023-08-03 DIAGNOSIS — Z79899 Other long term (current) drug therapy: Secondary | ICD-10-CM | POA: Diagnosis not present

## 2023-08-03 DIAGNOSIS — Z7982 Long term (current) use of aspirin: Secondary | ICD-10-CM | POA: Diagnosis not present

## 2023-08-03 DIAGNOSIS — I700 Atherosclerosis of aorta: Secondary | ICD-10-CM | POA: Diagnosis not present

## 2023-08-03 DIAGNOSIS — Z8249 Family history of ischemic heart disease and other diseases of the circulatory system: Secondary | ICD-10-CM | POA: Diagnosis not present

## 2023-08-03 DIAGNOSIS — R29704 NIHSS score 4: Secondary | ICD-10-CM | POA: Diagnosis not present

## 2023-08-03 DIAGNOSIS — Z882 Allergy status to sulfonamides status: Secondary | ICD-10-CM | POA: Diagnosis not present

## 2023-08-03 DIAGNOSIS — I5032 Chronic diastolic (congestive) heart failure: Secondary | ICD-10-CM | POA: Diagnosis not present

## 2023-08-03 DIAGNOSIS — Z885 Allergy status to narcotic agent status: Secondary | ICD-10-CM | POA: Diagnosis not present

## 2023-08-03 DIAGNOSIS — W101XXA Fall (on)(from) sidewalk curb, initial encounter: Secondary | ICD-10-CM | POA: Diagnosis present

## 2023-08-03 DIAGNOSIS — H905 Unspecified sensorineural hearing loss: Secondary | ICD-10-CM | POA: Diagnosis not present

## 2023-08-03 DIAGNOSIS — K219 Gastro-esophageal reflux disease without esophagitis: Secondary | ICD-10-CM | POA: Diagnosis not present

## 2023-08-03 DIAGNOSIS — S0093XA Contusion of unspecified part of head, initial encounter: Secondary | ICD-10-CM | POA: Diagnosis not present

## 2023-08-03 LAB — ECHOCARDIOGRAM COMPLETE
AR max vel: 2.9 cm2
AV Area VTI: 2.82 cm2
AV Area mean vel: 2.69 cm2
AV Mean grad: 5 mmHg
AV Peak grad: 9.6 mmHg
Ao pk vel: 1.55 m/s
Area-P 1/2: 3.42 cm2
Calc EF: 63.6 %
Height: 70 in
MV VTI: 3.49 cm2
S' Lateral: 3.2 cm
Single Plane A2C EF: 67.1 %
Single Plane A4C EF: 60.3 %
Weight: 2880 [oz_av]

## 2023-08-03 LAB — CBC
HCT: 38.2 % — ABNORMAL LOW (ref 39.0–52.0)
Hemoglobin: 12.8 g/dL — ABNORMAL LOW (ref 13.0–17.0)
MCH: 31.4 pg (ref 26.0–34.0)
MCHC: 33.5 g/dL (ref 30.0–36.0)
MCV: 93.9 fL (ref 80.0–100.0)
Platelets: 158 10*3/uL (ref 150–400)
RBC: 4.07 MIL/uL — ABNORMAL LOW (ref 4.22–5.81)
RDW: 12.8 % (ref 11.5–15.5)
WBC: 6.3 10*3/uL (ref 4.0–10.5)
nRBC: 0 % (ref 0.0–0.2)

## 2023-08-03 LAB — LIPID PANEL
Cholesterol: 165 mg/dL (ref 0–200)
HDL: 51 mg/dL (ref >40)
LDL Cholesterol: 102 mg/dL — ABNORMAL HIGH (ref 0–99)
Total CHOL/HDL Ratio: 3.2 ratio
Triglycerides: 60 mg/dL (ref <150)
VLDL: 12 mg/dL (ref 0–40)

## 2023-08-03 LAB — HEMOGLOBIN A1C
Hgb A1c MFr Bld: 5.3 % (ref 4.8–5.6)
Mean Plasma Glucose: 105.41 mg/dL

## 2023-08-03 MED ORDER — MOMETASONE FURO-FORMOTEROL FUM 200-5 MCG/ACT IN AERO
2.0000 | INHALATION_SPRAY | Freq: Two times a day (BID) | RESPIRATORY_TRACT | Status: DC
  Administered 2023-08-03 – 2023-08-04 (×3): 2 via RESPIRATORY_TRACT
  Filled 2023-08-03: qty 8.8

## 2023-08-03 MED ORDER — ALBUTEROL SULFATE HFA 108 (90 BASE) MCG/ACT IN AERS
1.0000 | INHALATION_SPRAY | Freq: Four times a day (QID) | RESPIRATORY_TRACT | Status: DC | PRN
Start: 2023-08-03 — End: 2023-08-03

## 2023-08-03 MED ORDER — SODIUM CHLORIDE (PF) 0.9 % IJ SOLN
INTRAMUSCULAR | Status: AC
  Filled 2023-08-03: qty 50

## 2023-08-03 MED ORDER — CLOPIDOGREL BISULFATE 75 MG PO TABS
75.0000 mg | ORAL_TABLET | Freq: Every day | ORAL | Status: DC
  Administered 2023-08-03 – 2023-08-04 (×2): 75 mg via ORAL
  Filled 2023-08-03 (×2): qty 1

## 2023-08-03 MED ORDER — ATORVASTATIN CALCIUM 40 MG PO TABS
40.0000 mg | ORAL_TABLET | Freq: Every day | ORAL | Status: DC
  Administered 2023-08-03 – 2023-08-04 (×2): 40 mg via ORAL
  Filled 2023-08-03 (×2): qty 1

## 2023-08-03 MED ORDER — MONTELUKAST SODIUM 10 MG PO TABS
10.0000 mg | ORAL_TABLET | Freq: Every day | ORAL | Status: DC
  Administered 2023-08-03 – 2023-08-04 (×2): 10 mg via ORAL
  Filled 2023-08-03 (×2): qty 1

## 2023-08-03 MED ORDER — ACETAMINOPHEN 650 MG RE SUPP: 650.0000 mg | Freq: Four times a day (QID) | RECTAL | Status: DC | PRN

## 2023-08-03 MED ORDER — ACETAMINOPHEN 325 MG PO TABS
650.0000 mg | ORAL_TABLET | Freq: Four times a day (QID) | ORAL | Status: DC | PRN
  Administered 2023-08-03 – 2023-08-04 (×2): 650 mg via ORAL
  Filled 2023-08-03 (×2): qty 2

## 2023-08-03 MED ORDER — ASPIRIN 81 MG PO TBEC
81.0000 mg | DELAYED_RELEASE_TABLET | Freq: Every day | ORAL | Status: DC
Start: 2023-08-03 — End: 2023-08-04
  Administered 2023-08-03 – 2023-08-04 (×2): 81 mg via ORAL
  Filled 2023-08-03 (×2): qty 1

## 2023-08-03 MED ORDER — ACETAMINOPHEN 160 MG/5ML PO SOLN: 650.0000 mg | Freq: Four times a day (QID) | ORAL | Status: DC | PRN

## 2023-08-03 MED ORDER — TAMSULOSIN HCL 0.4 MG PO CAPS
0.4000 mg | ORAL_CAPSULE | Freq: Every day | ORAL | Status: DC
  Administered 2023-08-03 – 2023-08-04 (×2): 0.4 mg via ORAL
  Filled 2023-08-03 (×2): qty 1

## 2023-08-03 MED ORDER — BUTALBITAL-APAP-CAFFEINE 50-325-40 MG PO TABS
2.0000 | ORAL_TABLET | Freq: Four times a day (QID) | ORAL | Status: DC | PRN
  Administered 2023-08-03 – 2023-08-04 (×2): 2 via ORAL
  Filled 2023-08-03 (×2): qty 2

## 2023-08-03 MED ORDER — STROKE: EARLY STAGES OF RECOVERY BOOK
Freq: Once | Status: AC
  Filled 2023-08-03: qty 1

## 2023-08-03 MED ORDER — ALBUTEROL SULFATE (2.5 MG/3ML) 0.083% IN NEBU: 2.5000 mg | INHALATION_SOLUTION | Freq: Four times a day (QID) | RESPIRATORY_TRACT | Status: DC | PRN

## 2023-08-03 MED ORDER — IOHEXOL 350 MG/ML SOLN
75.0000 mL | Freq: Once | INTRAVENOUS | Status: AC | PRN
  Administered 2023-08-03: 75 mL via INTRAVENOUS

## 2023-08-03 NOTE — Consult Note (Signed)
 NEUROLOGY CONSULT NOTE   Date of service: August 03, 2023 Patient Name: Erik Gomez MRN:  [id number] DOB:  03/30/1941 Chief Complaint: "R MCA stroke" Requesting Provider: John Giovanni, MD  History of Present Illness  Erik Gomez is a 83 y.o. male with hx of asthma GERD, Vit D deficiency, osteoarthritis who got up this AM, was walking around his house and got back inside through the garage door and hit his forehead in to the garage door keypad. He is not sure how he did not notice this and ran into it. HE did not fall. Has a hematoma on his left forehead. He went to urgent care and was directed to the ED.  CT Head concerning for R MCA stroke and MRI Brain confirms a R MCA stroke.  No prior hx of strokes, no hx of DM2, HTN, HLD, quit smoking over 50+ years ago. No hx of Afibb.  LKW: 1000 Modified rankin score: 0-Completely asymptomatic and back to baseline post- stroke IV Thrombolysis: not offered, outside window EVT: not offered, stroke appears completed.   NIHSS components Score: Comment  1a Level of Conscious 0[x]  1[]  2[]  3[]      1b LOC Questions 0[x]  1[]  2[]       1c LOC Commands 0[x]  1[]  2[]       2 Best Gaze 0[x]  1[]  2[]       3 Visual 0[]  1[x]  2[]  3[]    Hemianopsia vs visual neglect  4 Facial Palsy 0[x]  1[]  2[]  3[]      5a Motor Arm - left 0[x]  1[]  2[]  3[]  4[]  UN[]    5b Motor Arm - Right 0[x]  1[]  2[]  3[]  4[]  UN[]    6a Motor Leg - Left 0[x]  1[]  2[]  3[]  4[]  UN[]    6b Motor Leg - Right 0[x]  1[]  2[]  3[]  4[]  UN[]    7 Limb Ataxia 0[x]  1[]  2[]  3[]  UN[]     8 Sensory 0[]  1[]  2[x]  UN[]    Hemisensory loss vs neglect  9 Best Language 0[x]  1[]  2[]  3[]      10 Dysarthria 0[x]  1[]  2[]  UN[]      11 Extinct. and Inattention 0[]  1[]  2[x]       TOTAL: 4      ROS  Comprehensive ROS performed and pertinent positives documented in HPI   Past History   Past Medical History:  Diagnosis Date   Aortic atherosclerosis (HCC) 02/22/2023   Recvd call (03/07/23) from [facility]  with results - seen on 02/22/23 CT chest Xray   Asthma, intrinsic    Cataract    per pt "beginning of a cataract" - bilateral   Chronic headache    GERD (gastroesophageal reflux disease)    w/ LPR   Hiatal hernia    Internal hemorrhoid    Osteoarthritis 02/05/2021   Peyronie disease    Pneumonia    x 1 as a child   Vitamin D deficiency     Past Surgical History:  Procedure Laterality Date   CATARACT EXTRACTION, BILATERAL Bilateral 2018   COLONOSCOPY     FINGER SURGERY     middle finger of right hand   FOREIGN BODY REMOVAL  03/09/2023   Procedure: FOREIGN BODY REMOVAL;  Surgeon: Chilton Greathouse, MD;  Location: [facility];  Service: Cardiopulmonary;;  crown   KNEE SURGERY     Left   Peyronie's Disease surgery  2001   SHOULDER SURGERY Right    [facility] [place]   SKIN CANCER EXCISION     Right leg   VIDEO BRONCHOSCOPY  N/A 03/09/2023   Procedure: VIDEO BRONCHOSCOPY WITH FLUORO;  Surgeon: Chilton Greathouse, MD;  Location: [facility];  Service: Cardiopulmonary;  Laterality: N/A;    Family History: Family History  Problem Relation Age of Onset   Heart disease Mother    Heart disease Father    Diabetes Father    Diabetes Sister    Diabetes Brother    Colon cancer Neg Hx    Esophageal cancer Neg Hx    Stomach cancer Neg Hx    Rectal cancer Neg Hx     Social History  reports that he quit smoking about 58 years ago. His smoking use included cigarettes. He started smoking about 64 years ago. He has a 12 pack-year smoking history. He has never used smokeless tobacco. He reports current alcohol use of about 1.0 - 2.0 standard drink of alcohol per week. He reports that he does not use drugs.  Allergies  Allergen Reactions   Codeine     Upset stomach   Meloxicam     Upset stomach   Pantoprazole Sodium     Upset stomach   Sulfa Antibiotics Nausea Only    Medications   Current Facility-Administered Medications:    aspirin EC tablet 81 mg, 81 mg, Oral, Daily,  Erick Blinks, MD   clopidogrel (PLAVIX) tablet 75 mg, 75 mg, Oral, Daily, Erick Blinks, MD  Current Outpatient Medications:    Albuterol Sulfate (PROAIR RESPICLICK) 108 (90 Base) MCG/ACT AEPB, Inhale 2 puffs into the lungs every 6 (six) hours as needed., Disp: 1 each, Rfl: 3   Biotin 1 MG CAPS, Take 1 capsule by mouth daily., Disp: , Rfl:    budesonide-formoterol (SYMBICORT) 160-4.5 MCG/ACT inhaler, Inhale 2 puffs into the lungs 2 (two) times daily., Disp: 3 each, Rfl: 3   cetirizine (ZYRTEC) 10 MG tablet, Take 10 mg by mouth at bedtime., Disp: , Rfl:    montelukast (SINGULAIR) 10 MG tablet, Take 1 tablet (10 mg total) by mouth daily., Disp: 90 tablet, Rfl: 3   Multiple Vitamins-Minerals (LUTEIN-ZEAXANTHIN) TABS, Take 2 mg by mouth daily., Disp: , Rfl:    omeprazole (PRILOSEC) 40 MG capsule, TAKE 1 CAPSULE BY MOUTH TWICE DAILY ., Disp: 180 capsule, Rfl: 3   tamsulosin (FLOMAX) 0.4 MG CAPS capsule, Take 0.4 mg by mouth daily., Disp: , Rfl:    vitamin D, CHOLECALCIFEROL, 400 UNITS tablet, Take 1,000 Units by mouth daily., Disp: , Rfl:   Vitals   Vitals:   08/03/23 0000 08/03/23 0015 08/03/23 0230 08/03/23 0244  BP: 131/77 124/70  (!) 150/94  Pulse: (!) 59 (!) 59 69 69  Resp:   20 16  Temp:      TempSrc:      SpO2: 99% 99% 97% 95%  Weight:      Height:        Body mass index is 25.83 kg/m.  Physical Exam   General: Laying comfortably in bed; in no acute distress.  HENT: Normal oropharynx and mucosa. Normal external appearance of ears and nose.  Neck: Supple, no pain or tenderness  CV: No JVD. No peripheral edema.  Pulmonary: Symmetric Chest rise. Normal respiratory effort.  Abdomen: Soft to touch, non-tender.  Ext: No cyanosis, edema, or deformity  Skin: No rash. Normal palpation of skin.   Musculoskeletal: Normal digits and nails by inspection. No clubbing.   Neurologic Examination  Mental status/Cognition: Alert, oriented to self, place, month and year, good  attention.  Speech/language: Fluent, comprehension intact, object naming intact, repetition intact.  Cranial nerves:   CN II Pupils equal and reactive to light, L hemianopsia vs visual neglect   CN III,IV,VI EOM intact, no gaze preference or deviation, no nystagmus    CN V normal sensation in V1, V2, and V3 segments bilaterally    CN VII no asymmetry, no nasolabial fold flattening    CN VIII normal hearing to speech    CN IX & X normal palatal elevation, no uvular deviation    CN XI 5/5 head turn and 5/5 shoulder shrug bilaterally    CN XII midline tongue protrusion    Motor:  Muscle bulk: normal, tone normal, pronator drift none tremor none Mvmt Root Nerve  Muscle Right Left Comments  SA C5/6 Ax Deltoid 5 5   EF C5/6 Mc Biceps 5 5   EE C6/7/8 Rad Triceps 5 5   WF C6/7 Med FCR     WE C7/8 PIN ECU     F Ab C8/T1 U ADM/FDI 5 5   HF L1/2/3 Fem Illopsoas 5 5   KE L2/3/4 Fem Quad 5 5   DF L4/5 D Peron Tib Ant 5 5   PF S1/2 Tibial Grc/Sol 5 5    Sensation:  Light touch Decreased sensation vs neglect in LUE and LLE   Pin prick    Temperature    Vibration   Proprioception    Coordination/Complex Motor:  - Finger to Nose intact BL - Heel to shin intact BL - Rapid alternating movement are normal - Gait: deferred.  Labs/Imaging/Neurodiagnostic studies   CBC:  Recent Labs  Lab 2023-08-28 2100 28-Aug-2023 2102  WBC 4.1  --   NEUTROABS 3.2  --   HGB 13.1 13.3  HCT 38.5* 39.0  MCV 92.8  --   PLT 137*  --    Basic Metabolic Panel:  Lab Results  Component Value Date   NA 140 08/28/23   K 4.2 Aug 28, 2023   CO2 22 Aug 28, 2023   GLUCOSE 148 (H) 2023-08-28   BUN 19 08-28-2023   CREATININE 1.10 08/28/2023   CALCIUM 9.3 08/28/23   GFRNONAA >60 28-Aug-2023   Lipid Panel:  Lab Results  Component Value Date   LDLCALC 102 (H) 08/03/2023   HgbA1c: No results found for: "HGBA1C" Urine Drug Screen:     Component Value Date/Time   LABOPIA NONE DETECTED 2023-08-28 2327    COCAINSCRNUR NONE DETECTED Aug 28, 2023 2327   LABBENZ NONE DETECTED 08-28-2023 2327   AMPHETMU NONE DETECTED August 28, 2023 2327   THCU NONE DETECTED 2023-08-28 2327   LABBARB NONE DETECTED 08-28-2023 2327    Alcohol Level     Component Value Date/Time   ETH <10 08-28-2023 2100   INR  Lab Results  Component Value Date   INR 1.1 28-Aug-2023   APTT  Lab Results  Component Value Date   APTT 24 2023-08-28   AED levels: No results found for: "PHENYTOIN", "ZONISAMIDE", "LAMOTRIGINE", "LEVETIRACETA"  CT Head without contrast(Personally reviewed): R parietal lobe stroke.  CT angio Head and Neck with contrast(Personally reviewed): R MCA M3 branch occlusion  MRI Brain(Personally reviewed): Acute right MCA distribution infarct, primarily involving the posterior right temporal lobe as above. Minimal associated petechial blood products without hemorrhagic transformation or significant mass effect. 2. Additional punctate acute ischemic nonhemorrhagic infarct at the left occipital lobe.  ASSESSMENT   Erik Gomez is a 83 y.o. male with hx of asthma GERD, Vit D deficiency, osteoarthritis who ran into his garage door keypad with a L forehead bruise. Found to have  R MCA stroke. Exam with L hemivisual neglect vs visual field loss and hemisensory neglect vs hemisensory loss.  Etiology unclear, stroke appears embolic.  RECOMMENDATIONS  - Frequent Neuro checks per stroke unit protocol - Recommend obtaining TTE  - Recommend obtaining Lipid panel with LDL - Please start statin if LDL > 70 - Recommend HbA1c to evaluate for diabetes and how well it is controlled. - Antithrombotic - Aspirin 81mg  daily. - Recommend DVT ppx - SBP goal - permissive hypertension first 24 h < 220/110. Held home meds.  - Recommend Telemetry monitoring for arrythmia - Recommend bedside swallow screen prior to PO intake. - Stroke education booklet - Recommend PT/OT/SLP consult - Admit to [facility]. -  Stroke team to follow. ______________________________________________________________________    Welton Flakes, MD [facility]

## 2023-08-03 NOTE — Progress Notes (Signed)
 STROKE TEAM PROGRESS NOTE   SUBJECTIVE (INTERVAL HISTORY) No family is at the bedside.  Patient lying bed, still has left forehead bruise.  Still has left lower quadrantanopsia.  Denies any heart palpitation.   OBJECTIVE Temp:  [97.3 F (36.3 C)-98.9 F (37.2 C)] 98.4 F (36.9 C) (02/27 1713) Pulse Rate:  [57-72] 58 (02/27 1713) Cardiac Rhythm: Normal sinus rhythm (02/27 1657) Resp:  [12-20] 12 (02/27 1430) BP: (116-154)/(69-99) 118/69 (02/27 1713) SpO2:  [92 %-100 %] 97 % (02/27 1713) Weight:  [81.6 kg] 81.6 kg (02/26 1959)  No results for input(s): "GLUCAP" in the last 168 hours. Recent Labs  Lab 08/02/23 2100 08/02/23 2102  NA 137 140  K 4.2 4.2  CL 105 106  CO2 22  --   GLUCOSE 152* 148*  BUN 20 19  CREATININE 1.02 1.10  CALCIUM 9.3  --    Recent Labs  Lab 08/02/23 2100  AST 22  ALT 17  ALKPHOS 69  BILITOT 0.8  PROT 6.7  ALBUMIN 4.0   Recent Labs  Lab 08/02/23 2100 08/02/23 2102 08/03/23 0625  WBC 4.1  --  6.3  NEUTROABS 3.2  --   --   HGB 13.1 13.3 12.8*  HCT 38.5* 39.0 38.2*  MCV 92.8  --  93.9  PLT 137*  --  158   No results for input(s): "CKTOTAL", "CKMB", "CKMBINDEX", "TROPONINI" in the last 168 hours. Recent Labs    08/02/23 2100  LABPROT 14.1  INR 1.1   Recent Labs    08/02/23 2327  COLORURINE YELLOW  LABSPEC 1.024  PHURINE 7.0  GLUCOSEU NEGATIVE  HGBUR NEGATIVE  BILIRUBINUR NEGATIVE  KETONESUR NEGATIVE  PROTEINUR NEGATIVE  NITRITE NEGATIVE  LEUKOCYTESUR NEGATIVE       Component Value Date/Time   CHOL 165 08/03/2023 0238   TRIG 60 08/03/2023 0238   HDL 51 08/03/2023 0238   CHOLHDL 3.2 08/03/2023 0238   VLDL 12 08/03/2023 0238   LDLCALC 102 (H) 08/03/2023 0238   Lab Results  Component Value Date   HGBA1C 5.3 08/03/2023      Component Value Date/Time   LABOPIA NONE DETECTED 08/02/2023 2327   COCAINSCRNUR NONE DETECTED 08/02/2023 2327   LABBENZ NONE DETECTED 08/02/2023 2327   AMPHETMU NONE DETECTED 08/02/2023 2327    THCU NONE DETECTED 08/02/2023 2327   LABBARB NONE DETECTED 08/02/2023 2327    Recent Labs  Lab 08/02/23 2100  ETH <10    I have personally reviewed the radiological images below and agree with the radiology interpretations.  ECHOCARDIOGRAM COMPLETE Result Date: 08/03/2023    ECHOCARDIOGRAM REPORT   Patient Name:   Erik Gomez [facility] Date of Exam: 08/03/2023 Medical Rec #:  [id number]         Height:       70.0 in Accession #:    [id number]        Weight:       180.0 lb Date of Birth:  [birth year]         BSA:          1.996 m Patient Age:    82 years          BP:           131/71 mmHg Patient Gender: M                 HR:           55 bpm. Exam Location:  Inpatient Procedure: 2D Echo, Color Doppler and Cardiac Doppler (  Both Spectral and Color            Flow Doppler were utilized during procedure). Indications:    Stroke  History:        Patient has prior history of Echocardiogram examinations and                 Patient has no prior history of Echocardiogram examinations.  Sonographer:    Amy Chionchio Referring Phys: [id number] VASUNDHRA RATHORE IMPRESSIONS  1. Left ventricular ejection fraction, by estimation, is 60 to 65%. The left ventricle has normal function. The left ventricle has no regional wall motion abnormalities. Left ventricular diastolic parameters are consistent with Grade I diastolic dysfunction (impaired relaxation).  2. Right ventricular systolic function is normal. The right ventricular size is normal.  3. The mitral valve is grossly normal. No evidence of mitral valve regurgitation. No evidence of mitral stenosis.  4. The aortic valve is tricuspid. There is moderate calcification of the aortic valve. Aortic valve regurgitation is not visualized. Aortic valve sclerosis is present, with no evidence of aortic valve stenosis.  5. Aortic dilatation noted. There is mild dilatation of the ascending aorta, measuring 40 mm.  6. The inferior vena cava is normal in size with greater than 50%  respiratory variability, suggesting right atrial pressure of 3 mmHg. Comparison(s): No prior Echocardiogram. FINDINGS  Left Ventricle: Left ventricular ejection fraction, by estimation, is 60 to 65%. The left ventricle has normal function. The left ventricle has no regional wall motion abnormalities. Strain imaging was not performed. The left ventricular internal cavity  size was normal in size. There is no left ventricular hypertrophy. Left ventricular diastolic parameters are consistent with Grade I diastolic dysfunction (impaired relaxation). Right Ventricle: The right ventricular size is normal. No increase in right ventricular wall thickness. Right ventricular systolic function is normal. Left Atrium: Left atrial size was normal in size. Right Atrium: Right atrial size was normal in size. Pericardium: There is no evidence of pericardial effusion. Presence of epicardial fat layer. Mitral Valve: The mitral valve is grossly normal. No evidence of mitral valve regurgitation. No evidence of mitral valve stenosis. MV peak gradient, 3.2 mmHg. The mean mitral valve gradient is 1.0 mmHg. Tricuspid Valve: The tricuspid valve is normal in structure. Tricuspid valve regurgitation is not demonstrated. No evidence of tricuspid stenosis. Aortic Valve: The aortic valve is tricuspid. There is moderate calcification of the aortic valve. There is mild aortic valve annular calcification. Aortic valve regurgitation is not visualized. Aortic valve sclerosis is present, with no evidence of aortic valve stenosis. Aortic valve mean gradient measures 5.0 mmHg. Aortic valve peak gradient measures 9.6 mmHg. Aortic valve area, by VTI measures 2.82 cm. Pulmonic Valve: The pulmonic valve was normal in structure. Pulmonic valve regurgitation is not visualized. No evidence of pulmonic stenosis. Aorta: Aortic dilatation noted. There is mild dilatation of the ascending aorta, measuring 40 mm. Venous: The inferior vena cava is normal in size  with greater than 50% respiratory variability, suggesting right atrial pressure of 3 mmHg. IAS/Shunts: The atrial septum is grossly normal. Additional Comments: 3D imaging was not performed.  LEFT VENTRICLE PLAX 2D LVIDd:         4.80 cm      Diastology LVIDs:         3.20 cm      LV e' medial:    6.53 cm/s LV PW:         0.90 cm      LV E/e' medial:  12.6 LV IVS:        0.70 cm      LV e' lateral:   8.05 cm/s LVOT diam:     2.10 cm      LV E/e' lateral: 10.3 LV SV:         85 LV SV Index:   43 LVOT Area:     3.46 cm  LV Volumes (MOD) LV vol d, MOD A2C: 95.0 ml LV vol d, MOD A4C: 110.0 ml LV vol s, MOD A2C: 31.3 ml LV vol s, MOD A4C: 43.7 ml LV SV MOD A2C:     63.7 ml LV SV MOD A4C:     110.0 ml LV SV MOD BP:      65.3 ml RIGHT VENTRICLE             IVC RV Basal diam:  3.00 cm     IVC diam: 2.00 cm RV S prime:     10.60 cm/s TAPSE (M-mode): 2.1 cm LEFT ATRIUM             Index        RIGHT ATRIUM           Index LA Vol (A2C):   60.3 ml 30.21 ml/m  RA Area:     17.70 cm LA Vol (A4C):   56.0 ml 28.06 ml/m  RA Volume:   45.70 ml  22.90 ml/m LA Biplane Vol: 61.7 ml 30.91 ml/m  AORTIC VALVE                     PULMONIC VALVE AV Area (Vmax):    2.90 cm      PV Vmax:       0.98 m/s AV Area (Vmean):   2.69 cm      PV Peak grad:  3.8 mmHg AV Area (VTI):     2.82 cm AV Vmax:           155.00 cm/s AV Vmean:          105.000 cm/s AV VTI:            0.301 m AV Peak Grad:      9.6 mmHg AV Mean Grad:      5.0 mmHg LVOT Vmax:         130.00 cm/s LVOT Vmean:        81.600 cm/s LVOT VTI:          0.245 m LVOT/AV VTI ratio: 0.81  AORTA Ao Root diam: 3.20 cm Ao Asc diam:  4.00 cm MITRAL VALVE               TRICUSPID VALVE MV Area (PHT): 3.42 cm    TR Peak grad:   26.8 mmHg MV Area VTI:   3.49 cm    TR Vmax:        259.00 cm/s MV Peak grad:  3.2 mmHg MV Mean grad:  1.0 mmHg    SHUNTS MV Vmax:       0.89 m/s    Systemic VTI:  0.24 m MV Vmean:      40.3 cm/s   Systemic Diam: 2.10 cm MV Decel Time: 222 msec MV E velocity: 82.60  cm/s MV A velocity: 49.20 cm/s MV E/A ratio:  1.68 Riley Lam MD Electronically signed by Riley Lam MD Signature Date/Time: 08/03/2023/3:36:39 PM    Final    VAS [place] LOWER EXTREMITY VENOUS (DVT) Result Date: 08/03/2023  Lower Venous DVT Study Patient Name:  Erik Gomez  Date of Exam:   08/03/2023 Medical Rec #: [id number]          Accession #:    [id number] Date of Birth: 08/14/1940          Patient Gender: M Patient Age:   74 years Exam Location:  [facility] Procedure:      VAS [place] LOWER EXTREMITY VENOUS (DVT) Referring Phys: Scheryl Marten Matraca Hunkins --------------------------------------------------------------------------------  Indications: Stroke.  Risk Factors: Trauma Recent fall. Comparison Study: None. Performing Technologist: Shona Simpson  Examination Guidelines: A complete evaluation includes B-mode imaging, spectral Doppler, color Doppler, and power Doppler as needed of all accessible portions of each vessel. Bilateral testing is considered an integral part of a complete examination. Limited examinations for reoccurring indications may be performed as noted. The reflux portion of the exam is performed with the patient in reverse Trendelenburg.  +---------+---------------+---------+-----------+----------+--------------+ RIGHT    CompressibilityPhasicitySpontaneityPropertiesThrombus Aging +---------+---------------+---------+-----------+----------+--------------+ CFV      Full           Yes      Yes                                 +---------+---------------+---------+-----------+----------+--------------+ SFJ      Full                                                        +---------+---------------+---------+-----------+----------+--------------+ FV Prox  Full                                                        +---------+---------------+---------+-----------+----------+--------------+ FV Mid   Full                                                         +---------+---------------+---------+-----------+----------+--------------+ FV DistalFull                                                        +---------+---------------+---------+-----------+----------+--------------+ PFV      Full                                                        +---------+---------------+---------+-----------+----------+--------------+ POP      Full           Yes      Yes                                 +---------+---------------+---------+-----------+----------+--------------+ PTV      Full                                                        +---------+---------------+---------+-----------+----------+--------------+  PERO     Full                                                        +---------+---------------+---------+-----------+----------+--------------+   +---------+---------------+---------+-----------+----------+--------------+ LEFT     CompressibilityPhasicitySpontaneityPropertiesThrombus Aging +---------+---------------+---------+-----------+----------+--------------+ CFV      Full           Yes      Yes                                 +---------+---------------+---------+-----------+----------+--------------+ SFJ      Full                                                        +---------+---------------+---------+-----------+----------+--------------+ FV Prox  Full                                                        +---------+---------------+---------+-----------+----------+--------------+ FV Mid   Full                                                        +---------+---------------+---------+-----------+----------+--------------+ FV DistalFull                                                        +---------+---------------+---------+-----------+----------+--------------+ PFV      Full                                                         +---------+---------------+---------+-----------+----------+--------------+ POP      Full           Yes      Yes                                 +---------+---------------+---------+-----------+----------+--------------+ PTV      Full                                                        +---------+---------------+---------+-----------+----------+--------------+ PERO     Full                                                        +---------+---------------+---------+-----------+----------+--------------+  Summary: BILATERAL: - No evidence of deep vein thrombosis seen in the lower extremities, bilaterally. -No evidence of popliteal cyst, bilaterally.   *See table(s) above for measurements and observations.    Preliminary    CT ANGIO HEAD NECK W WO CM Result Date: 08/03/2023 CLINICAL DATA:  Follow-up examination for stroke. EXAM: CT ANGIOGRAPHY HEAD AND NECK WITH AND WITHOUT CONTRAST TECHNIQUE: Multidetector CT imaging of the head and neck was performed using the standard protocol during bolus administration of intravenous contrast. Multiplanar CT image reconstructions and MIPs were obtained to evaluate the vascular anatomy. Carotid stenosis measurements (when applicable) are obtained utilizing NASCET criteria, using the distal internal carotid diameter as the denominator. RADIATION DOSE REDUCTION: This exam was performed according to the departmental dose-optimization program which includes automated exposure control, adjustment of the mA and/or kV according to patient size and/or use of iterative reconstruction technique. CONTRAST:  75mL OMNIPAQUE IOHEXOL 350 MG/ML SOLN COMPARISON:  CT and MRI from 08/02/2023. FINDINGS: CTA NECK FINDINGS Aortic arch: Visualized aortic arch dilated up to 3.7 cm. Standard 3 vessel morphology. Mild aortic atherosclerosis. No stenosis about the origin the great vessels. Right carotid system: Right common and internal carotid arteries are patent without  dissection. Mild atheromatous change about the right carotid bulb without hemodynamically significant greater than 50% stenosis. Left carotid system: Left common and internal carotid arteries are patent without dissection. Mild atheromatous change about the left carotid bulb without hemodynamically significant greater than 50% stenosis. Vertebral arteries: Both vertebral arteries arise from the subclavian arteries. Vertebral arteries are patent without stenosis or dissection. Skeleton: No discrete or worrisome osseous lesions. Moderate spondylosis noted at C5-6 and C6-7. Other neck: No other acute finding. Upper chest: No other acute finding. Review of the MIP images confirms the above findings CTA HEAD FINDINGS Anterior circulation: And atheromatous change about the carotid siphons without hemodynamically significant stenosis. A1 segments patent bilaterally. Normal anterior communicating artery complex. Anterior cerebral arteries patent without stenosis. No M1 stenosis or occlusion. Left MCA branches patent and well perfused. On the right, there is acute occlusion of a proximal right M3 branch, inferior division (series 8, image 237). Remainder of the right MCA branches remain patent and perfused. Posterior circulation: Both V4 segments patent without stenosis. Neither PICA origin well visualized. Basilar patent without stenosis. Superior cerebellar and posterior cerebral arteries patent bilaterally. Venous sinuses: Patent allowing for timing the contrast bolus. Anatomic variants: None significant.  No aneurysm. Review of the MIP images confirms the above findings IMPRESSION: 1. Acute proximal right M3 occlusion, inferior division. 2. Otherwise wide patency of the major arterial vasculature of the head and neck. No other hemodynamically significant or correctable stenosis. 3. Dilatation of the aortic arch up to 3.7 cm. Recommend annual imaging followup by CTA or MRA. This recommendation follows 2010  ACCF/AHA/AATS/ACR/ASA/SCA/SCAI/SIR/STS/SVM Guidelines for the Diagnosis and Management of Patients with Thoracic Aortic Disease. Circulation.2010; 121: Z610-R604. Aortic aneurysm NOS (ICD10-I71.9) 4. Aortic Atherosclerosis (ICD10-I70.0). Electronically Signed   By: Rise Mu M.D.   On: 08/03/2023 04:35   MR Brain W and Wo Contrast Result Date: 08/03/2023 CLINICAL DATA:  Follow-up examination for stroke. EXAM: MRI HEAD WITHOUT AND WITH CONTRAST TECHNIQUE: Multiplanar, multiecho pulse sequences of the brain and surrounding structures were obtained without and with intravenous contrast. CONTRAST:  8mL GADAVIST GADOBUTROL 1 MMOL/ML IV SOLN COMPARISON:  CT from earlier the same day. FINDINGS: Brain: Cerebral volume within normal limits. Few scattered patchy foci of T2/FLAIR hyperintensity involving the periventricular and deep white matter both  cerebral hemispheres, consistent with chronic small vessel ischemic disease, minor for age. Few small remote bilateral cerebellar infarcts noted, right greater than left. Confluent restricted diffusion involving the posterior right temporal lobe, consistent with an acute right MCA distribution infarct (series 5, image 26). Minimal patchy involvement of the insula as well as the posterior right parietooccipital region noted. Area of infarction measures up to approximately 7 cm in diameter. Minimal associated petechial blood products without hemorrhagic transformation (series 10, image 23). No significant regional mass effect. Additional punctate acute ischemic nonhemorrhagic infarct noted at the left occipital lobe (series 5, image 22). No other evidence for acute or subacute ischemia. Gray-white matter deficient otherwise maintained. No acute or chronic intracranial blood products. No mass lesion, midline shift or mass effect. No hydrocephalus. Cavum et septum pellucidum noted. Pituitary gland suprasellar region within normal limits. Subcentimeter partially  serpiginous focus of enhancement noted at the right occipital lobe (series 16, image 61). Finding favored to be vascular in nature or possibly related to underlying ischemic changes. No other abnormal enhancement. Vascular: Susceptibility artifact at the level of the right sylvian fissure, likely reflecting a thrombosed right MCA branch given the adjacent right MCA territory infarct (series 10, image 27, 31). Major intracranial vascular flow voids are otherwise maintained. Skull and upper cervical spine: Craniocervical junction within limits. Bone marrow signal intensity normal. Small soft tissue contusion present at the left frontal scalp. Sinuses/Orbits: Prior bilateral ocular lens replacement. Scattered mucosal thickening noted about the maxillary sinuses. Small volume pneumatized secretions noted within the left sphenoid sinus. No significant mastoid effusion. Other: None. IMPRESSION: 1. Acute right MCA distribution infarct, primarily involving the posterior right temporal lobe as above. Minimal associated petechial blood products without hemorrhagic transformation or significant mass effect. 2. Additional punctate acute ischemic nonhemorrhagic infarct at the left occipital lobe. 3. Underlying mild chronic microvascular ischemic disease with a few small remote cerebellar infarcts. 4. Small soft tissue contusion at the left frontal scalp. 5. Subcentimeter focus of enhancement at the right occipital lobe. Finding is nonspecific, but favored to be either vascular in nature or possibly related to underlying ischemic changes. Short interval follow-up study in 3 months time is suggested to evaluate for resolution and/or stability. Electronically Signed   By: Rise Mu M.D.   On: 08/03/2023 00:11   CT Head Wo Contrast Result Date: 08/02/2023 CLINICAL DATA:  Head trauma, moderate to severe. Patient struck head around 10 o'clock today after trip and fall injury. Hematoma to the left side of the head above  the eyebrow. Sleepier than usual and intermittent dizziness. EXAM: CT HEAD WITHOUT CONTRAST TECHNIQUE: Contiguous axial images were obtained from the base of the skull through the vertex without intravenous contrast. RADIATION DOSE REDUCTION: This exam was performed according to the departmental dose-optimization program which includes automated exposure control, adjustment of the mA and/or kV according to patient size and/or use of iterative reconstruction technique. COMPARISON:  CT paranasal sinuses 06/10/2011 FINDINGS: Brain: Mild diffuse cerebral atrophy. Mild ventricular dilatation consistent with central atrophy. Cavum septum pellucidum. Poorly defined low-attenuation changes are demonstrated in the right parietal lobe associated with loss of gray-white differentiation and sulcal effacement in this area. This is most likely to represent acute infarct but underlying mass lesion could also have this appearance. MRI is suggested for further characterization. No abnormal extra-axial fluid collections. Basal cisterns are not effaced. No acute intracranial hemorrhage. Vascular: No hyperdense vessel or unexpected calcification. Skull: Normal. Negative for fracture or focal lesion. Sinuses/Orbits: Mucosal thickening in the  paranasal sinuses. No acute air-fluid levels. Mastoid air cells are clear. Other: None. IMPRESSION: 1. Vague low-attenuation with associated sulcal effacement and loss of gray-white differentiation in the right parietal lobe. This is likely acute infarct in the distribution of the middle cerebral artery. A mass lesion could also possibly have this appearance. MRI is recommended. 2. Chronic atrophy and small vessel ischemic changes. No acute intracranial hemorrhage. Electronically Signed   By: Burman Nieves M.D.   On: 08/02/2023 20:35     PHYSICAL EXAM  Temp:  [97.3 F (36.3 C)-98.9 F (37.2 C)] 98.4 F (36.9 C) (02/27 1713) Pulse Rate:  [57-72] 58 (02/27 1713) Resp:  [12-20] 12 (02/27  1430) BP: (116-154)/(69-99) 118/69 (02/27 1713) SpO2:  [92 %-100 %] 97 % (02/27 1713) Weight:  [81.6 kg] 81.6 kg (02/26 1959)  General - Well nourished, well developed, in no apparent distress.  Ophthalmologic - fundi not visualized due to noncooperation.  Cardiovascular - Regular rhythm and rate.  Mental Status -  Level of arousal and orientation to time, place, and person were intact. Language including expression, naming, repetition, comprehension was assessed and found intact. Fund of Knowledge was assessed and was intact.  Cranial Nerves II - XII - II - Visual field exam showed left lower quadrantanopsia. III, IV, VI - Extraocular movements intact. V - Facial sensation intact bilaterally. VII - Facial movement intact bilaterally. VIII - Hearing & vestibular intact bilaterally. X - Palate elevates symmetrically. XI - Chin turning & shoulder shrug intact bilaterally. XII - Tongue protrusion intact.  Motor Strength - The patient's strength was normal in all extremities and pronator drift was absent.  Bulk was normal and fasciculations were absent.   Motor Tone - Muscle tone was assessed at the neck and appendages and was normal.  Reflexes - The patient's reflexes were symmetrical in all extremities and he had no pathological reflexes.  Sensory - Light touch, temperature/pinprick were assessed and were symmetrical.    Coordination - The patient had normal movements in the hands and feet with no ataxia or dysmetria.  Tremor was absent.  Gait and Station - deferred.   ASSESSMENT/PLAN [name] is a 83 y.o. male with history of arthritis and vitamin D deficiency admitted for head injury with bumping into garage door. No TNK given due to outside window.    Stroke:  right MCA infarct likely embolic secondary to cardioembolic source CT right MCA infarct MRI right MCA infarct with questionable punctate left MCA/PCA infarct CT head and neck proximal right M3  occlusion 2D Echo EF 60 to 65% LE venous Doppler no DVT Recommend loop recorder placement prior to discharge LDL 102 HgbA1c 5.3 UDS negative SCDs for VTE prophylaxis No antithrombotic prior to admission, now on aspirin 81 mg daily and clopidogrel 75 mg daily DAPT for 3 weeks and then aspirin alone. Patient counseled to be compliant with his antithrombotic medications Ongoing aggressive stroke risk factor management Therapy recommendations: Pending Disposition: Pending  BP management Stable Avoid low BP Long term BP goal normotensive  Hyperlipidemia Home meds: None LDL 102, goal < 70 Now on Lipitor 40 Continue statin at discharge  Other Stroke Risk Factors Advanced age  Other Active Problems Left forehead bruise  Hospital day # 0  I spent additional 30-minute inpatient face-to-face time with the patient, more than 50% of which was spent in counseling and coordination of care, reviewing test results, images and medication, and discussing the diagnosis, treatment plan and potential prognosis. This patient's care  requiresreview of multiple databases, neurological assessment, discussion with family, other specialists and medical decision making of high complexity.  Marvel Plan, MD PhD Stroke Neurology 08/03/2023 7:19 PM    To contact Stroke Continuity provider, please refer to [email or web address]. After hours, contact General Neurology

## 2023-08-03 NOTE — ED Notes (Signed)
 Family updated as to patient's status.

## 2023-08-03 NOTE — Progress Notes (Signed)
 PT Cancellation Note  Patient Details Name: Erik Gomez MRN: [id number] DOB: Sep 15, 1940   Cancelled Treatment:    Reason Eval/Treat Not Completed: Other (comment)Patient to tansfe to [facility] for stroke W/U./ [facility] PT Acute Rehabilitation Services Office [phone]     Rada Hay 08/03/2023, 9:59 AM

## 2023-08-03 NOTE — H&P (Signed)
 History and Physical    Erik Gomez ZOX:[id number] DOB: 1940/08/27 DOA: 08/02/2023  PCP: Geoffry Paradise, MD  Patient coming from: Home  Chief Complaint: Fall, head injury  HPI: Erik Gomez is a 83 y.o. male with medical history significant of asthma, GERD, former cigarette smoker, BPH, aortic atherosclerosis, vitamin D deficiency, osteoarthritis presented to urgent care for evaluation of dizziness and head injury from a fall.  He was sent to the ED for CT scan and further evaluation.  Vital signs on arrival: Temperature 97.3 F, pulse 60, respiratory rate 16, blood pressure 143/71, SpO2 98% on room air.    Brain MRI showing: "IMPRESSION: 1. Acute right MCA distribution infarct, primarily involving the posterior right temporal lobe as above. Minimal associated petechial blood products without hemorrhagic transformation or significant mass effect. 2. Additional punctate acute ischemic nonhemorrhagic infarct at the left occipital lobe. 3. Underlying mild chronic microvascular ischemic disease with a few small remote cerebellar infarcts. 4. Small soft tissue contusion at the left frontal scalp. 5. Subcentimeter focus of enhancement at the right occipital lobe. Finding is nonspecific, but favored to be either vascular in nature or possibly related to underlying ischemic changes. Short interval follow-up study in 3 months time is suggested to evaluate for resolution and/or stability."  Labs showing no leukocytosis, hemoglobin stable, platelet count 137k. Neurology consulted and ordered aspirin 81 mg daily and Plavix 75 mg daily.  TR[facility] called to admit.  Patient states around 10 or 10:15 AM yesterday he was walking in his driveway when he thinks he either felt dizzy or tripped over something causing him to hit his head against the garage door keypad.  He did not fall to the ground and did not lose consciousness.  He then walked back to the garage and sat down on a chair.  His wife  came to check on him and did not notice any facial droop, difficulty with speech, or focal weakness.  Patient is not aware of having history of previous stroke.  He does not take any blood thinners.  He has no other complaints.  Denies fevers, chills, cough, shortness of breath, chest pain, vomiting, diarrhea, or abdominal pain.  Review of Systems:  Review of Systems  All other systems reviewed and are negative.   Past Medical History:  Diagnosis Date   Aortic atherosclerosis (HCC) 02/22/2023   Recvd call (03/07/23) from [facility] with results - seen on 02/22/23 CT chest Xray   Asthma, intrinsic    Cataract    per pt "beginning of a cataract" - bilateral   Chronic headache    GERD (gastroesophageal reflux disease)    w/ LPR   Hiatal hernia    Internal hemorrhoid    Osteoarthritis 02/05/2021   Peyronie disease    Pneumonia    x 1 as a child   Vitamin D deficiency     Past Surgical History:  Procedure Laterality Date   CATARACT EXTRACTION, BILATERAL Bilateral 2018   COLONOSCOPY     FINGER SURGERY     middle finger of right hand   FOREIGN BODY REMOVAL  03/09/2023   Procedure: FOREIGN BODY REMOVAL;  Surgeon: Chilton Greathouse, MD;  Location: [facility] END[facility]COPY;  Service: Cardiopulmonary;;  crown   KNEE SURGERY     Left   Peyronie's Disease surgery  2001   SHOULDER SURGERY Right    at the [facility] [place]   SKIN CANCER EXCISION     Right leg   VIDEO BRONCHOSCOPY N/A 03/09/2023  Procedure: VIDEO BRONCHOSCOPY WITH FLUORO;  Surgeon: Chilton Greathouse, MD;  Location: [facility] END[facility]COPY;  Service: Cardiopulmonary;  Laterality: N/A;     reports that he quit smoking about 58 years ago. His smoking use included cigarettes. He started smoking about 64 years ago. He has a 12 pack-year smoking history. He has never used smokeless tobacco. He reports current alcohol use of about 1.0 - 2.0 standard drink of alcohol per week. He reports that he does not use drugs.  Allergies  Allergen Reactions    Codeine     Upset stomach   Meloxicam     Upset stomach   Pantoprazole Sodium     Upset stomach   Sulfa Antibiotics Nausea Only    Family History  Problem Relation Age of Onset   Heart disease Mother    Heart disease Father    Diabetes Father    Diabetes Sister    Diabetes Brother    Colon cancer Neg Hx    Esophageal cancer Neg Hx    Stomach cancer Neg Hx    Rectal cancer Neg Hx     Prior to Admission medications   Medication Sig Start Date End Date Taking? Authorizing Provider  Albuterol Sulfate (PROAIR RESPICLICK) 108 (90 Base) MCG/ACT AEPB Inhale 2 puffs into the lungs every 6 (six) hours as needed. 12/02/21  Yes Mannam, Praveen, MD  Biotin 1 MG CAPS Take 1 capsule by mouth daily.   Yes [provider]  budesonide-formoterol (SYMBICORT) 160-4.5 MCG/ACT inhaler Inhale 2 puffs into the lungs 2 (two) times daily. 06/26/23 06/25/24 Yes Mannam, Praveen, MD  cetirizine (ZYRTEC) 10 MG tablet Take 10 mg by mouth at bedtime.   Yes [provider]  montelukast (SINGULAIR) 10 MG tablet Take 1 tablet (10 mg total) by mouth daily. 01/20/23 05/01/24 Yes Mannam, Praveen, MD  Multiple Vitamins-Minerals (LUTEIN-ZEAXANTHIN) TABS Take 2 mg by mouth daily.   Yes [provider]  omeprazole (PRILOSEC) 40 MG capsule TAKE 1 CAPSULE BY MOUTH TWICE DAILY . 11/24/22  Yes Meryl Dare, MD  tamsulosin (FLOMAX) 0.4 MG CAPS capsule Take 0.4 mg by mouth daily.   Yes [provider]  vitamin D, CHOLECALCIFEROL, 400 UNITS tablet Take 1,000 Units by mouth daily.   Yes [provider]    Physical Exam: Vitals:   08/02/23 2149 08/02/23 2351 08/03/23 0000 08/03/23 0015  BP:  136/86 131/77 124/70  Pulse: 60 61 (!) 59 (!) 59  Resp: 14 14    Temp:  97.9 F (36.6 C)    TempSrc:  Oral    SpO2: 97% 100% 99% 99%  Weight:      Height:        Physical Exam Vitals reviewed.  Constitutional:      General: He is not in acute distress. HENT:     Head:  Normocephalic.     Comments: Left forehead bruise Eyes:     Extraocular Movements: Extraocular movements intact.  Cardiovascular:     Rate and Rhythm: Normal rate and regular rhythm.     Pulses: Normal pulses.  Pulmonary:     Effort: Pulmonary effort is normal. No respiratory distress.     Breath sounds: Normal breath sounds. No wheezing or rales.  Abdominal:     General: Bowel sounds are normal. There is no distension.     Palpations: Abdomen is soft.     Tenderness: There is no abdominal tenderness. There is no guarding.  Musculoskeletal:     Cervical back: Normal range  of motion.     Right lower leg: No edema.     Left lower leg: No edema.  Skin:    General: Skin is warm and dry.  Neurological:     General: No focal deficit present.     Mental Status: He is alert and oriented to person, place, and time.     Cranial Nerves: No cranial nerve deficit.     Sensory: No sensory deficit.     Motor: No weakness.     Labs on Admission: I have personally reviewed following labs and imaging studies  CBC: Recent Labs  Lab 08/02/23 2100 08/02/23 2102  WBC 4.1  --   NEUTROABS 3.2  --   HGB 13.1 13.3  HCT 38.5* 39.0  MCV 92.8  --   PLT 137*  --    Basic Metabolic Panel: Recent Labs  Lab 08/02/23 2100 08/02/23 2102  NA 137 140  K 4.2 4.2  CL 105 106  CO2 22  --   GLUCOSE 152* 148*  BUN 20 19  CREATININE 1.02 1.10  CALCIUM 9.3  --    GFR: Estimated Creatinine Clearance: 54.4 mL/min (by C-G formula based on SCr of 1.1 mg/dL). Liver Function Tests: Recent Labs  Lab 08/02/23 2100  AST 22  ALT 17  ALKPHOS 69  BILITOT 0.8  PROT 6.7  ALBUMIN 4.0   No results for input(s): "LIPASE", "AMYLASE" in the last 168 hours. No results for input(s): "AMMONIA" in the last 168 hours. Coagulation Profile: Recent Labs  Lab 08/02/23 2100  INR 1.1   Cardiac Enzymes: No results for input(s): "CKTOTAL", "CKMB", "CKMBINDEX", "TROPONINI" in the last 168 hours. BNP (last 3  results) No results for input(s): "PROBNP" in the last 8760 hours. HbA1C: No results for input(s): "HGBA1C" in the last 72 hours. CBG: No results for input(s): "GLUCAP" in the last 168 hours. Lipid Profile: No results for input(s): "CHOL", "HDL", "LDLCALC", "TRIG", "CHOLHDL", "LDLDIRECT" in the last 72 hours. Thyroid Function Tests: No results for input(s): "TSH", "T4TOTAL", "FREET4", "T3FREE", "THYROIDAB" in the last 72 hours. Anemia Panel: No results for input(s): "VITAMINB12", "FOLATE", "FERRITIN", "TIBC", "IRON", "RETICCTPCT" in the last 72 hours. Urine analysis:    Component Value Date/Time   COLORURINE YELLOW 08/02/2023 2327   APPEARANCEUR CLEAR 08/02/2023 2327   LABSPEC 1.024 08/02/2023 2327   PHURINE 7.0 08/02/2023 2327   GLUCOSEU NEGATIVE 08/02/2023 2327   HGBUR NEGATIVE 08/02/2023 2327   BILIRUBINUR NEGATIVE 08/02/2023 2327   KETONESUR NEGATIVE 08/02/2023 2327   PROTEINUR NEGATIVE 08/02/2023 2327   NITRITE NEGATIVE 08/02/2023 2327   LEUKOCYTESUR NEGATIVE 08/02/2023 2327    Radiological Exams on Admission: MR Brain W and Wo Contrast Result Date: 08/03/2023 CLINICAL DATA:  Follow-up examination for stroke. EXAM: MRI HEAD WITHOUT AND WITH CONTRAST TECHNIQUE: Multiplanar, multiecho pulse sequences of the brain and surrounding structures were obtained without and with intravenous contrast. CONTRAST:  8mL GADAVIST GADOBUTROL 1 MMOL/ML IV SOLN COMPARISON:  CT from earlier the same day. FINDINGS: Brain: Cerebral volume within normal limits. Few scattered patchy foci of T2/FLAIR hyperintensity involving the periventricular and deep white matter both cerebral hemispheres, consistent with chronic small vessel ischemic disease, minor for age. Few small remote bilateral cerebellar infarcts noted, right greater than left. Confluent restricted diffusion involving the posterior right temporal lobe, consistent with an acute right MCA distribution infarct (series 5, image 26). Minimal patchy  involvement of the insula as well as the posterior right parietooccipital region noted. Area of infarction measures up to  approximately 7 cm in diameter. Minimal associated petechial blood products without hemorrhagic transformation (series 10, image 23). No significant regional mass effect. Additional punctate acute ischemic nonhemorrhagic infarct noted at the left occipital lobe (series 5, image 22). No other evidence for acute or subacute ischemia. Gray-white matter deficient otherwise maintained. No acute or chronic intracranial blood products. No mass lesion, midline shift or mass effect. No hydrocephalus. Cavum et septum pellucidum noted. Pituitary gland suprasellar region within normal limits. Subcentimeter partially serpiginous focus of enhancement noted at the right occipital lobe (series 16, image 61). Finding favored to be vascular in nature or possibly related to underlying ischemic changes. No other abnormal enhancement. Vascular: Susceptibility artifact at the level of the right sylvian fissure, likely reflecting a thrombosed right MCA branch given the adjacent right MCA territory infarct (series 10, image 27, 31). Major intracranial vascular flow voids are otherwise maintained. Skull and upper cervical spine: Craniocervical junction within limits. Bone marrow signal intensity normal. Small soft tissue contusion present at the left frontal scalp. Sinuses/Orbits: Prior bilateral ocular lens replacement. Scattered mucosal thickening noted about the maxillary sinuses. Small volume pneumatized secretions noted within the left sphenoid sinus. No significant mastoid effusion. Other: None. IMPRESSION: 1. Acute right MCA distribution infarct, primarily involving the posterior right temporal lobe as above. Minimal associated petechial blood products without hemorrhagic transformation or significant mass effect. 2. Additional punctate acute ischemic nonhemorrhagic infarct at the left occipital lobe. 3.  Underlying mild chronic microvascular ischemic disease with a few small remote cerebellar infarcts. 4. Small soft tissue contusion at the left frontal scalp. 5. Subcentimeter focus of enhancement at the right occipital lobe. Finding is nonspecific, but favored to be either vascular in nature or possibly related to underlying ischemic changes. Short interval follow-up study in 3 months time is suggested to evaluate for resolution and/or stability. Electronically Signed   By: Rise Mu M.D.   On: 08/03/2023 00:11   CT Head Wo Contrast Result Date: 08/02/2023 CLINICAL DATA:  Head trauma, moderate to severe. Patient struck head around 10 o'clock today after trip and fall injury. Hematoma to the left side of the head above the eyebrow. Sleepier than usual and intermittent dizziness. EXAM: CT HEAD WITHOUT CONTRAST TECHNIQUE: Contiguous axial images were obtained from the base of the skull through the vertex without intravenous contrast. RADIATION DOSE REDUCTION: This exam was performed according to the departmental dose-optimization program which includes automated exposure control, adjustment of the mA and/or kV according to patient size and/or use of iterative reconstruction technique. COMPARISON:  CT paranasal sinuses 06/10/2011 FINDINGS: Brain: Mild diffuse cerebral atrophy. Mild ventricular dilatation consistent with central atrophy. Cavum septum pellucidum. Poorly defined low-attenuation changes are demonstrated in the right parietal lobe associated with loss of gray-white differentiation and sulcal effacement in this area. This is most likely to represent acute infarct but underlying mass lesion could also have this appearance. MRI is suggested for further characterization. No abnormal extra-axial fluid collections. Basal cisterns are not effaced. No acute intracranial hemorrhage. Vascular: No hyperdense vessel or unexpected calcification. Skull: Normal. Negative for fracture or focal lesion.  Sinuses/Orbits: Mucosal thickening in the paranasal sinuses. No acute air-fluid levels. Mastoid air cells are clear. Other: None. IMPRESSION: 1. Vague low-attenuation with associated sulcal effacement and loss of gray-white differentiation in the right parietal lobe. This is likely acute infarct in the distribution of the middle cerebral artery. A mass lesion could also possibly have this appearance. MRI is recommended. 2. Chronic atrophy and small vessel ischemic changes. No  acute intracranial hemorrhage. Electronically Signed   By: Burman Nieves M.D.   On: 08/02/2023 20:35    EKG: Independently reviewed.  Sinus rhythm with occasional PVCs.  Assessment and Plan  Acute right MCA stroke On exam done by neurology, patient noted to have left hemivisual neglect versus visual field loss and hemisensory neglect versus hemisensory loss.  Etiology of stroke unclear, possibly embolic. -Discussed with neurology, recommending continuing aspirin 81 mg daily along with Plavix 75 mg daily -CTA head and neck -Echocardiogram -A1c, lipid panel -Start statin if LDL >70 -Allow permissive hypertension for the first 24 hours <220/110 -Telemetry monitoring -Frequent neurochecks per stroke protocol -PT, OT, speech therapy. -N.p.o. until cleared by bedside swallow evaluation or formal speech evaluation  Mild thrombocytopenia Continue to monitor CBC.  Asthma Stable, no signs of acute exacerbation.  Continue home meds.  BPH Continue Flomax.  DVT prophylaxis: SCDs Code Status: Full Code (discussed with the patient) Family Communication: Wife and daughter at bedside. Consults called: Neurology Level of care: Progressive Care Unit Admission status: It is my clinical opinion that admission to INPATIENT is reasonable and necessary because of the expectation that this patient will require hospital care that crosses at least 2 midnights to treat this condition based on the medical complexity of the problems  presented.  Given the aforementioned information, the predictability of an adverse outcome is felt to be significant.  John Giovanni MD [facility]  If 7PM-7AM, please contact night-coverage [email or web address]  08/03/2023, 2:24 AM

## 2023-08-03 NOTE — ED Notes (Signed)
 Called [facility] for transport.

## 2023-08-03 NOTE — ED Notes (Signed)
 Report given to Shaun with [facility]

## 2023-08-03 NOTE — ED Notes (Signed)
 Report given Luther Parody, Charity fundraiser at [facility].

## 2023-08-03 NOTE — Progress Notes (Signed)
 Lower extremity venous duplex completed. Please see CV Procedures for preliminary results.  Shona Simpson, RVT 08/03/23 10:34 AM

## 2023-08-03 NOTE — ED Notes (Signed)
 Neurologist at bedside.

## 2023-08-03 NOTE — Progress Notes (Signed)
 Courtesy note No billing-  Patient is seen and examined today morning. He is admitted for evaluation of stroke this morning. Please see H&P.   Physical Exam Vitals reviewed.  HENT:     Head: Contusion present.   Eyes:     Extraocular Movements: Extraocular movements intact.     Pupils: Pupils are equal, round, and reactive to light.  Cardiovascular:     Rate and Rhythm: Normal rate and regular rhythm.     Heart sounds: Normal heart sounds.  Abdominal:     General: Bowel sounds are normal.     Palpations: Abdomen is soft.  Neurological:     General: No focal deficit present.     Mental Status: He is alert.     He is able to tolerate diet, working with PT/ OT. Plan to [facility] to [facility] for [facility] work up. Continue Aspirin, Plavix regime along with statin. MRI brain - Acute right MCA distribution infarct, primarily involving the posterior right temporal lobe as above. Additional punctate acute ischemic nonhemorrhagic infarct at the left occipital lobe. CTA  head and neck- Acute proximal right M3 occlusion, inferior division.  Echo - pending. Neurology follow up at [facility].

## 2023-08-03 NOTE — ED Notes (Signed)
 Patient transported to CT

## 2023-08-03 NOTE — ED Notes (Signed)
 ED Provider at bedside.

## 2023-08-04 ENCOUNTER — Encounter (HOSPITAL_COMMUNITY): Payer: Self-pay | Admitting: Cardiology

## 2023-08-04 ENCOUNTER — Encounter (HOSPITAL_COMMUNITY)
Admission: EM | Disposition: A | Discharge status: Home / Self Care | Payer: Self-pay | Source: Home / Self Care | Attending: Family Medicine

## 2023-08-04 DIAGNOSIS — E785 Hyperlipidemia, unspecified: Secondary | ICD-10-CM

## 2023-08-04 DIAGNOSIS — I63411 Cerebral infarction due to embolism of right middle cerebral artery: Secondary | ICD-10-CM | POA: Diagnosis not present

## 2023-08-04 DIAGNOSIS — I639 Cerebral infarction, unspecified: Secondary | ICD-10-CM | POA: Diagnosis not present

## 2023-08-04 DIAGNOSIS — H5340 Unspecified visual field defects: Secondary | ICD-10-CM

## 2023-08-04 HISTORY — PX: LOOP RECORDER INSERTION: EP1214

## 2023-08-04 SURGERY — LOOP RECORDER INSERTION

## 2023-08-04 MED ORDER — ONDANSETRON 4 MG PO TBDP: 4.0000 mg | ORAL_TABLET | Freq: Three times a day (TID) | ORAL | Status: DC | PRN

## 2023-08-04 MED ORDER — LIDOCAINE-EPINEPHRINE 1 %-1:100000 IJ SOLN
INTRAMUSCULAR | Status: DC
Start: 2023-08-04 — End: 2023-08-04
  Filled 2023-08-04: qty 1

## 2023-08-04 MED ORDER — LIDOCAINE-EPINEPHRINE 1 %-1:100000 IJ SOLN
INTRAMUSCULAR | Status: DC | PRN
  Administered 2023-08-04: 10 mL

## 2023-08-04 MED ORDER — ATORVASTATIN CALCIUM 40 MG PO TABS: 40.0000 mg | ORAL_TABLET | Freq: Every day | ORAL | 3 refills | Status: AC

## 2023-08-04 MED ORDER — ORAL CARE MOUTH RINSE: 15.0000 mL | OROMUCOSAL | Status: DC | PRN

## 2023-08-04 MED ORDER — CLOPIDOGREL BISULFATE 75 MG PO TABS: 75.0000 mg | ORAL_TABLET | Freq: Every day | ORAL | 0 refills | Status: DC

## 2023-08-04 MED ORDER — ONDANSETRON HCL 4 MG/2ML IJ SOLN
INTRAMUSCULAR | Status: AC
  Administered 2023-08-04: 4 mg
  Filled 2023-08-04: qty 2

## 2023-08-04 MED ORDER — ASPIRIN 81 MG PO TBEC: 81.0000 mg | DELAYED_RELEASE_TABLET | Freq: Every day | ORAL | 12 refills | Status: DC

## 2023-08-04 SURGICAL SUPPLY — 2 items
MONITOR REVEAL LINQ II (Prosthesis & Implant Heart) IMPLANT
PACK LOOP INSERTION (CUSTOM PROCEDURE TRAY) ×1 IMPLANT

## 2023-08-04 NOTE — Progress Notes (Signed)
 Patient moved off unit for loop recorder implant procedure.  Patient alert and oriented x 4, room air, and consent signed and placed in patient chart.  Patient's spouse remains in the patients room 3W24.

## 2023-08-04 NOTE — Discharge Summary (Signed)
 Physician Discharge Summary  JR MILLIRON ZOX:[id number] DOB: 1940-10-05 DOA: 08/02/2023  PCP: Geoffry Paradise, MD  Admit date: 08/02/2023 Discharge date: 08/04/2023  Time spent: 26 minutes  Recommendations for Outpatient Follow-up:  Needs Chem-12 CBC in about a week Needs cholesterol A1c etc. in about 3 months  Discharge Diagnoses:  MAIN problem for hospitalization   Stroke  Please see below for itemized issues addressed in HOpsital- refer to other progress notes for clarity if needed  Discharge Condition: Improved  Diet recommendation: Heart healthy  Filed Weights   08/02/23 1959  Weight: 81.6 kg    History of present illness:  83 year old white male Known sensorineural hearing loss followed by ENT, reflux, vitamin D deficiency and arthritis former smoker 50 years plus prior 2/27 to [facility] ED 2/2 hitting head on garage door hit forehead and ran into this no LOC no fall to the ground no facial droop dysarthria-from urgent care came to ED CT head showed R MCA stroke MRI brain confirmed Sodium 137 potassium 4.2 BUN/creatinine 20/1.0 LFTs normal WBC 4.1 hemoglobin 13.1 platelet 137 UA negative alcohol level negative UDS negative   Transfer to [facility]-neurology evaluated   Plan   Acute R MCA stroke Main deficits hemineglect visual loss etc.-aspirin Plavix likely 21 days ending 3/19 on aspirin alone? CT angio acute proximal M3 occlusion with widely patent other areas, MR brain confirms the same-there is an nonspecific enhancement right occipital lobe which requires follow-up Therapy cleared the patient for discharge and he will get outpatient SLP   Dilated aortic arch 3.7 cm Annual imaging recommended on follow-up   Nausea vomiting Possibly a result of the hemianopsia   HFpEF without acute findings Likely related to hypertensive heart disease    Discharge Exam: Vitals:   08/04/23 1119 08/04/23 1604  BP: 114/75 (!) 115/50  Pulse: 67 69  Resp: 17 18   Temp: 97.9 F (36.6 C) 98.9 F (37.2 C)  SpO2: 99% 97%    Subj on day of d/c   Coherent awake alert no distress Looks well feels well No gross deficit at this time He has a little bit of latency Hx visual field on the left side Power is 5/5 S1-S2 no murmur Chest is clear ROM is intact to power Sensory is intact Reflexes are attenuated    Discharge Instructions   Discharge Instructions     Ambulatory referral to Neurology   Complete by: As directed    Follow up with stroke clinic NP at [facility] in about 4-8 weeks. Thanks.   Ambulatory referral to Occupational Therapy   Complete by: As directed    Ambulatory referral to Physical Therapy   Complete by: As directed    Diet - low sodium heart healthy   Complete by: As directed    Discharge instructions   Complete by: As directed    You were diagnosed with a stroke on hospital admission and you will need to take Plavix for 20 more days and then stop-continues to take aspirin We have started you on a cholesterol-lowering medication called statin and you should continue this long-term Please follow-up with your outpatient physician get labs in a week You will need outpatient therapy to help you maximize your goal   Increase activity slowly   Complete by: As directed       Allergies as of 08/04/2023       Reactions   Codeine    Upset stomach   Meloxicam    Upset stomach  Pantoprazole Sodium    Upset stomach   Sulfa Antibiotics Nausea Only        Medication List     STOP taking these medications    omeprazole 40 MG capsule Commonly known as: PRILOSEC       TAKE these medications    aspirin EC 81 MG tablet Take 1 tablet (81 mg total) by mouth daily. Swallow whole. Start taking on: August 05, 2023   atorvastatin 40 MG tablet Commonly known as: LIPITOR Take 1 tablet (40 mg total) by mouth daily. Start taking on: August 05, 2023   Biotin 1 MG Caps Take 1 capsule by mouth daily.   budesonide-formoterol  160-4.5 MCG/ACT inhaler Commonly known as: Symbicort Inhale 2 puffs into the lungs 2 (two) times daily.   cetirizine 10 MG tablet Commonly known as: ZYRTEC Take 10 mg by mouth at bedtime.   clopidogrel 75 MG tablet Commonly known as: PLAVIX Take 1 tablet (75 mg total) by mouth daily. Start taking on: August 05, 2023   Lutein-Zeaxanthin Tabs Take 2 mg by mouth daily.   montelukast 10 MG tablet Commonly known as: SINGULAIR Take 1 tablet (10 mg total) by mouth daily.   ProAir RespiClick 108 (90 Base) MCG/ACT Aepb Generic drug: Albuterol Sulfate Inhale 2 puffs into the lungs every 6 (six) hours as needed.   tamsulosin 0.4 MG Caps capsule Commonly known as: FLOMAX Take 0.4 mg by mouth daily.   vitamin D (CHOLECALCIFEROL) 400 units tablet Take 1,000 Units by mouth daily.       Allergies  Allergen Reactions   Codeine     Upset stomach   Meloxicam     Upset stomach   Pantoprazole Sodium     Upset stomach   Sulfa Antibiotics Nausea Only    Follow-up Information     [facility]. Schedule an appointment as soon as possible for a visit in 1 month(s).   Specialty: Neurology Why: stroke clinic Contact information: [address] [place] [address] [phone]        [facility] Follow up.   Why: [facility] will contact you for the first appointment. Contact information: [address] [place] [address] [phone]                  The results of significant diagnostics from this hospitalization (including imaging, microbiology, ancillary and laboratory) are listed below for reference.    Significant Diagnostic Studies: VAS [place] LOWER EXTREMITY VENOUS (DVT) Result Date: 08/04/2023  Lower Venous DVT Study Patient Name:  PAXON PROPES [facility]  Date of Exam:   08/03/2023 Medical Rec #: [id number]          Accession #:    [id number] Date of Birth:  02-18-1941          Patient Gender: M Patient Age:   83 years Exam Location:  [facility] Procedure:      VAS [place] LOWER EXTREMITY VENOUS (DVT) Referring Phys: Scheryl Marten XU --------------------------------------------------------------------------------  Indications: Stroke.  Risk Factors: Trauma Recent fall. Comparison Study: None. Performing Technologist: Shona Simpson  Examination Guidelines: A complete evaluation includes B-mode imaging, spectral Doppler, color Doppler, and power Doppler as needed of all accessible portions of each vessel. Bilateral testing is considered an integral part of a complete examination. Limited examinations for reoccurring indications may be performed as noted. The reflux portion of the exam is performed with the patient in  reverse Trendelenburg.  +---------+---------------+---------+-----------+----------+--------------+ RIGHT    CompressibilityPhasicitySpontaneityPropertiesThrombus Aging +---------+---------------+---------+-----------+----------+--------------+ CFV      Full           Yes      Yes                                 +---------+---------------+---------+-----------+----------+--------------+ SFJ      Full                                                        +---------+---------------+---------+-----------+----------+--------------+ FV Prox  Full                                                        +---------+---------------+---------+-----------+----------+--------------+ FV Mid   Full                                                        +---------+---------------+---------+-----------+----------+--------------+ FV DistalFull                                                        +---------+---------------+---------+-----------+----------+--------------+ PFV      Full                                                        +---------+---------------+---------+-----------+----------+--------------+ POP      Full            Yes      Yes                                 +---------+---------------+---------+-----------+----------+--------------+ PTV      Full                                                        +---------+---------------+---------+-----------+----------+--------------+ PERO     Full                                                        +---------+---------------+---------+-----------+----------+--------------+   +---------+---------------+---------+-----------+----------+--------------+ LEFT     CompressibilityPhasicitySpontaneityPropertiesThrombus Aging +---------+---------------+---------+-----------+----------+--------------+ CFV      Full           Yes      Yes                                 +---------+---------------+---------+-----------+----------+--------------+  SFJ      Full                                                        +---------+---------------+---------+-----------+----------+--------------+ FV Prox  Full                                                        +---------+---------------+---------+-----------+----------+--------------+ FV Mid   Full                                                        +---------+---------------+---------+-----------+----------+--------------+ FV DistalFull                                                        +---------+---------------+---------+-----------+----------+--------------+ PFV      Full                                                        +---------+---------------+---------+-----------+----------+--------------+ POP      Full           Yes      Yes                                 +---------+---------------+---------+-----------+----------+--------------+ PTV      Full                                                        +---------+---------------+---------+-----------+----------+--------------+ PERO     Full                                                         +---------+---------------+---------+-----------+----------+--------------+     Summary: BILATERAL: - No evidence of deep vein thrombosis seen in the lower extremities, bilaterally. -No evidence of popliteal cyst, bilaterally.   *See table(s) above for measurements and observations. Electronically signed by Carolynn Sayers on 08/04/2023 at 7:13:55 AM.    Final    ECHOCARDIOGRAM COMPLETE Result Date: 08/03/2023    ECHOCARDIOGRAM REPORT   Patient Name:   MEDFORD STAHELI [facility] Date of Exam: 08/03/2023 Medical Rec #:  [id number]         Height:       70.0 in Accession #:    [id number]        Weight:  180.0 lb Date of Birth:  Apr 18, 1941         BSA:          1.996 m Patient Age:    82 years          BP:           131/71 mmHg Patient Gender: M                 HR:           55 bpm. Exam Location:  Inpatient Procedure: 2D Echo, Color Doppler and Cardiac Doppler (Both Spectral and Color            Flow Doppler were utilized during procedure). Indications:    Stroke  History:        Patient has prior history of Echocardiogram examinations and                 Patient has no prior history of Echocardiogram examinations.  Sonographer:    Amy Chionchio Referring Phys: [id number] VASUNDHRA RATHORE IMPRESSIONS  1. Left ventricular ejection fraction, by estimation, is 60 to 65%. The left ventricle has normal function. The left ventricle has no regional wall motion abnormalities. Left ventricular diastolic parameters are consistent with Grade I diastolic dysfunction (impaired relaxation).  2. Right ventricular systolic function is normal. The right ventricular size is normal.  3. The mitral valve is grossly normal. No evidence of mitral valve regurgitation. No evidence of mitral stenosis.  4. The aortic valve is tricuspid. There is moderate calcification of the aortic valve. Aortic valve regurgitation is not visualized. Aortic valve sclerosis is present, with no evidence of aortic valve stenosis.  5. Aortic dilatation noted.  There is mild dilatation of the ascending aorta, measuring 40 mm.  6. The inferior vena cava is normal in size with greater than 50% respiratory variability, suggesting right atrial pressure of 3 mmHg. Comparison(s): No prior Echocardiogram. FINDINGS  Left Ventricle: Left ventricular ejection fraction, by estimation, is 60 to 65%. The left ventricle has normal function. The left ventricle has no regional wall motion abnormalities. Strain imaging was not performed. The left ventricular internal cavity  size was normal in size. There is no left ventricular hypertrophy. Left ventricular diastolic parameters are consistent with Grade I diastolic dysfunction (impaired relaxation). Right Ventricle: The right ventricular size is normal. No increase in right ventricular wall thickness. Right ventricular systolic function is normal. Left Atrium: Left atrial size was normal in size. Right Atrium: Right atrial size was normal in size. Pericardium: There is no evidence of pericardial effusion. Presence of epicardial fat layer. Mitral Valve: The mitral valve is grossly normal. No evidence of mitral valve regurgitation. No evidence of mitral valve stenosis. MV peak gradient, 3.2 mmHg. The mean mitral valve gradient is 1.0 mmHg. Tricuspid Valve: The tricuspid valve is normal in structure. Tricuspid valve regurgitation is not demonstrated. No evidence of tricuspid stenosis. Aortic Valve: The aortic valve is tricuspid. There is moderate calcification of the aortic valve. There is mild aortic valve annular calcification. Aortic valve regurgitation is not visualized. Aortic valve sclerosis is present, with no evidence of aortic valve stenosis. Aortic valve mean gradient measures 5.0 mmHg. Aortic valve peak gradient measures 9.6 mmHg. Aortic valve area, by VTI measures 2.82 cm. Pulmonic Valve: The pulmonic valve was normal in structure. Pulmonic valve regurgitation is not visualized. No evidence of pulmonic stenosis. Aorta: Aortic  dilatation noted. There is mild dilatation of the ascending aorta, measuring 40 mm. Venous: The inferior  vena cava is normal in size with greater than 50% respiratory variability, suggesting right atrial pressure of 3 mmHg. IAS/Shunts: The atrial septum is grossly normal. Additional Comments: 3D imaging was not performed.  LEFT VENTRICLE PLAX 2D LVIDd:         4.80 cm      Diastology LVIDs:         3.20 cm      LV e' medial:    6.53 cm/s LV PW:         0.90 cm      LV E/e' medial:  12.6 LV IVS:        0.70 cm      LV e' lateral:   8.05 cm/s LVOT diam:     2.10 cm      LV E/e' lateral: 10.3 LV SV:         85 LV SV Index:   43 LVOT Area:     3.46 cm  LV Volumes (MOD) LV vol d, MOD A2C: 95.0 ml LV vol d, MOD A4C: 110.0 ml LV vol s, MOD A2C: 31.3 ml LV vol s, MOD A4C: 43.7 ml LV SV MOD A2C:     63.7 ml LV SV MOD A4C:     110.0 ml LV SV MOD BP:      65.3 ml RIGHT VENTRICLE             IVC RV Basal diam:  3.00 cm     IVC diam: 2.00 cm RV S prime:     10.60 cm/s TAPSE (M-mode): 2.1 cm LEFT ATRIUM             Index        RIGHT ATRIUM           Index LA Vol (A2C):   60.3 ml 30.21 ml/m  RA Area:     17.70 cm LA Vol (A4C):   56.0 ml 28.06 ml/m  RA Volume:   45.70 ml  22.90 ml/m LA Biplane Vol: 61.7 ml 30.91 ml/m  AORTIC VALVE                     PULMONIC VALVE AV Area (Vmax):    2.90 cm      PV Vmax:       0.98 m/s AV Area (Vmean):   2.69 cm      PV Peak grad:  3.8 mmHg AV Area (VTI):     2.82 cm AV Vmax:           155.00 cm/s AV Vmean:          105.000 cm/s AV VTI:            0.301 m AV Peak Grad:      9.6 mmHg AV Mean Grad:      5.0 mmHg LVOT Vmax:         130.00 cm/s LVOT Vmean:        81.600 cm/s LVOT VTI:          0.245 m LVOT/AV VTI ratio: 0.81  AORTA Ao Root diam: 3.20 cm Ao Asc diam:  4.00 cm MITRAL VALVE               TRICUSPID VALVE MV Area (PHT): 3.42 cm    TR Peak grad:   26.8 mmHg MV Area VTI:   3.49 cm    TR Vmax:        259.00 cm/s MV Peak grad:  3.2 mmHg  MV Mean grad:  1.0 mmHg    SHUNTS MV Vmax:        0.89 m/s    Systemic VTI:  0.24 m MV Vmean:      40.3 cm/s   Systemic Diam: 2.10 cm MV Decel Time: 222 msec MV E velocity: 82.60 cm/s MV A velocity: 49.20 cm/s MV E/A ratio:  1.68 Riley Lam MD Electronically signed by Riley Lam MD Signature Date/Time: 08/03/2023/3:36:39 PM    Final    CT ANGIO HEAD NECK W WO CM Result Date: 08/03/2023 CLINICAL DATA:  Follow-up examination for stroke. EXAM: CT ANGIOGRAPHY HEAD AND NECK WITH AND WITHOUT CONTRAST TECHNIQUE: Multidetector CT imaging of the head and neck was performed using the standard protocol during bolus administration of intravenous contrast. Multiplanar CT image reconstructions and MIPs were obtained to evaluate the vascular anatomy. Carotid stenosis measurements (when applicable) are obtained utilizing NASCET criteria, using the distal internal carotid diameter as the denominator. RADIATION DOSE REDUCTION: This exam was performed according to the departmental dose-optimization program which includes automated exposure control, adjustment of the mA and/or kV according to patient size and/or use of iterative reconstruction technique. CONTRAST:  75mL OMNIPAQUE IOHEXOL 350 MG/ML SOLN COMPARISON:  CT and MRI from 08/02/2023. FINDINGS: CTA NECK FINDINGS Aortic arch: Visualized aortic arch dilated up to 3.7 cm. Standard 3 vessel morphology. Mild aortic atherosclerosis. No stenosis about the origin the great vessels. Right carotid system: Right common and internal carotid arteries are patent without dissection. Mild atheromatous change about the right carotid bulb without hemodynamically significant greater than 50% stenosis. Left carotid system: Left common and internal carotid arteries are patent without dissection. Mild atheromatous change about the left carotid bulb without hemodynamically significant greater than 50% stenosis. Vertebral arteries: Both vertebral arteries arise from the subclavian arteries. Vertebral arteries are patent  without stenosis or dissection. Skeleton: No discrete or worrisome osseous lesions. Moderate spondylosis noted at C5-6 and C6-7. Other neck: No other acute finding. Upper chest: No other acute finding. Review of the MIP images confirms the above findings CTA HEAD FINDINGS Anterior circulation: And atheromatous change about the carotid siphons without hemodynamically significant stenosis. A1 segments patent bilaterally. Normal anterior communicating artery complex. Anterior cerebral arteries patent without stenosis. No M1 stenosis or occlusion. Left MCA branches patent and well perfused. On the right, there is acute occlusion of a proximal right M3 branch, inferior division (series 8, image 237). Remainder of the right MCA branches remain patent and perfused. Posterior circulation: Both V4 segments patent without stenosis. Neither PICA origin well visualized. Basilar patent without stenosis. Superior cerebellar and posterior cerebral arteries patent bilaterally. Venous sinuses: Patent allowing for timing the contrast bolus. Anatomic variants: None significant.  No aneurysm. Review of the MIP images confirms the above findings IMPRESSION: 1. Acute proximal right M3 occlusion, inferior division. 2. Otherwise wide patency of the major arterial vasculature of the head and neck. No other hemodynamically significant or correctable stenosis. 3. Dilatation of the aortic arch up to 3.7 cm. Recommend annual imaging followup by CTA or MRA. This recommendation follows 2010 ACCF/AHA/AATS/ACR/ASA/SCA/SCAI/SIR/STS/SVM Guidelines for the Diagnosis and Management of Patients with Thoracic Aortic Disease. Circulation.2010; 121: Z610-R604. Aortic aneurysm NOS (ICD10-I71.9) 4. Aortic Atherosclerosis (ICD10-I70.0). Electronically Signed   By: Rise Mu M.D.   On: 08/03/2023 04:35   MR Brain W and Wo Contrast Result Date: 08/03/2023 CLINICAL DATA:  Follow-up examination for stroke. EXAM: MRI HEAD WITHOUT AND WITH CONTRAST  TECHNIQUE: Multiplanar, multiecho pulse sequences of the brain and surrounding  structures were obtained without and with intravenous contrast. CONTRAST:  8mL GADAVIST GADOBUTROL 1 MMOL/ML IV SOLN COMPARISON:  CT from earlier the same day. FINDINGS: Brain: Cerebral volume within normal limits. Few scattered patchy foci of T2/FLAIR hyperintensity involving the periventricular and deep white matter both cerebral hemispheres, consistent with chronic small vessel ischemic disease, minor for age. Few small remote bilateral cerebellar infarcts noted, right greater than left. Confluent restricted diffusion involving the posterior right temporal lobe, consistent with an acute right MCA distribution infarct (series 5, image 26). Minimal patchy involvement of the insula as well as the posterior right parietooccipital region noted. Area of infarction measures up to approximately 7 cm in diameter. Minimal associated petechial blood products without hemorrhagic transformation (series 10, image 23). No significant regional mass effect. Additional punctate acute ischemic nonhemorrhagic infarct noted at the left occipital lobe (series 5, image 22). No other evidence for acute or subacute ischemia. Gray-white matter deficient otherwise maintained. No acute or chronic intracranial blood products. No mass lesion, midline shift or mass effect. No hydrocephalus. Cavum et septum pellucidum noted. Pituitary gland suprasellar region within normal limits. Subcentimeter partially serpiginous focus of enhancement noted at the right occipital lobe (series 16, image 61). Finding favored to be vascular in nature or possibly related to underlying ischemic changes. No other abnormal enhancement. Vascular: Susceptibility artifact at the level of the right sylvian fissure, likely reflecting a thrombosed right MCA branch given the adjacent right MCA territory infarct (series 10, image 27, 31). Major intracranial vascular flow voids are otherwise  maintained. Skull and upper cervical spine: Craniocervical junction within limits. Bone marrow signal intensity normal. Small soft tissue contusion present at the left frontal scalp. Sinuses/Orbits: Prior bilateral ocular lens replacement. Scattered mucosal thickening noted about the maxillary sinuses. Small volume pneumatized secretions noted within the left sphenoid sinus. No significant mastoid effusion. Other: None. IMPRESSION: 1. Acute right MCA distribution infarct, primarily involving the posterior right temporal lobe as above. Minimal associated petechial blood products without hemorrhagic transformation or significant mass effect. 2. Additional punctate acute ischemic nonhemorrhagic infarct at the left occipital lobe. 3. Underlying mild chronic microvascular ischemic disease with a few small remote cerebellar infarcts. 4. Small soft tissue contusion at the left frontal scalp. 5. Subcentimeter focus of enhancement at the right occipital lobe. Finding is nonspecific, but favored to be either vascular in nature or possibly related to underlying ischemic changes. Short interval follow-up study in 3 months time is suggested to evaluate for resolution and/or stability. Electronically Signed   By: Rise Mu M.D.   On: 08/03/2023 00:11   CT Head Wo Contrast Result Date: 08/02/2023 CLINICAL DATA:  Head trauma, moderate to severe. Patient struck head around 10 o'clock today after trip and fall injury. Hematoma to the left side of the head above the eyebrow. Sleepier than usual and intermittent dizziness. EXAM: CT HEAD WITHOUT CONTRAST TECHNIQUE: Contiguous axial images were obtained from the base of the skull through the vertex without intravenous contrast. RADIATION DOSE REDUCTION: This exam was performed according to the departmental dose-optimization program which includes automated exposure control, adjustment of the mA and/or kV according to patient size and/or use of iterative reconstruction  technique. COMPARISON:  CT paranasal sinuses 06/10/2011 FINDINGS: Brain: Mild diffuse cerebral atrophy. Mild ventricular dilatation consistent with central atrophy. Cavum septum pellucidum. Poorly defined low-attenuation changes are demonstrated in the right parietal lobe associated with loss of gray-white differentiation and sulcal effacement in this area. This is most likely to represent acute infarct but underlying  mass lesion could also have this appearance. MRI is suggested for further characterization. No abnormal extra-axial fluid collections. Basal cisterns are not effaced. No acute intracranial hemorrhage. Vascular: No hyperdense vessel or unexpected calcification. Skull: Normal. Negative for fracture or focal lesion. Sinuses/Orbits: Mucosal thickening in the paranasal sinuses. No acute air-fluid levels. Mastoid air cells are clear. Other: None. IMPRESSION: 1. Vague low-attenuation with associated sulcal effacement and loss of gray-white differentiation in the right parietal lobe. This is likely acute infarct in the distribution of the middle cerebral artery. A mass lesion could also possibly have this appearance. MRI is recommended. 2. Chronic atrophy and small vessel ischemic changes. No acute intracranial hemorrhage. Electronically Signed   By: Burman Nieves M.D.   On: 08/02/2023 20:35    Microbiology: No results found for this or any previous visit (from the past 240 hours).   Labs: Basic Metabolic Panel: Recent Labs  Lab 08/02/23 2100 08/02/23 2102  NA 137 140  K 4.2 4.2  CL 105 106  CO2 22  --   GLUCOSE 152* 148*  BUN 20 19  CREATININE 1.02 1.10  CALCIUM 9.3  --    Liver Function Tests: Recent Labs  Lab 08/02/23 2100  AST 22  ALT 17  ALKPHOS 69  BILITOT 0.8  PROT 6.7  ALBUMIN 4.0   No results for input(s): "LIPASE", "AMYLASE" in the last 168 hours. No results for input(s): "AMMONIA" in the last 168 hours. CBC: Recent Labs  Lab 08/02/23 2100 08/02/23 2102  08/03/23 0625  WBC 4.1  --  6.3  NEUTROABS 3.2  --   --   HGB 13.1 13.3 12.8*  HCT 38.5* 39.0 38.2*  MCV 92.8  --  93.9  PLT 137*  --  158   Cardiac Enzymes: No results for input(s): "CKTOTAL", "CKMB", "CKMBINDEX", "TROPONINI" in the last 168 hours. BNP: BNP (last 3 results) No results for input(s): "BNP" in the last 8760 hours.  ProBNP (last 3 results) No results for input(s): "PROBNP" in the last 8760 hours.  CBG: No results for input(s): "GLUCAP" in the last 168 hours.  Signed:  Rhetta Mura MD   [facility] 08/04/2023, 4:24 PM

## 2023-08-04 NOTE — Consult Note (Addendum)
 ELECTROPHYSIOLOGY CONSULT NOTE  Patient ID: Erik Gomez MRN: [id number], DOB/AGE: 1940-06-26   Admit date: 08/02/2023 Date of Consult: 08/04/2023  Primary Physician: Geoffry Paradise, MD Primary Cardiologist: None  Primary Electrophysiologist: New to None  Reason for Consultation: Cryptogenic stroke; recommendations regarding Implantable Loop Recorder Insurance: [employer]  History of Present Illness: 83 y/o M who presented to [facility] with dizziness, walked into the garage door and a "lump on his head".    EP has been asked to evaluate Erik Gomez for placement of an implantable loop recorder to monitor for atrial fibrillation by Dr Roda Shutters.  The patient was admitted on 08/02/2023 with dizziness and head injury after walking into a garage door.  Work up positive for CVA.  Imaging demonstrated right MCA infarct likely embolic secondary to cardioembolic source .    He has undergone workup for stroke including:  CT right MCA infarct MRI right MCA infarct with questionable punctate left MCA/PCA infarct CT head and neck proximal right M3 occlusion 2D Echo EF 60 to 65% LE venous Doppler no DVT LDL 102 HgbA1c 5.3 UDS negative No antithrombotic prior to admission, now on aspirin 81 mg daily and clopidogrel 75 mg daily DAPT for 3 weeks and then aspirin alone.   The patient has been monitored on telemetry which has demonstrated sinus rhythm with no arrhythmias.  Inpatient stroke work-up Nely Dedmon not require a TEE per Neurology.   Echocardiogram as above. Lab work is reviewed.  Prior to admission, the patient denies chest pain, shortness of breath, dizziness, palpitations, or syncope.  He is recovering from his stroke with plans to return home  at discharge.  Allergies, Past Medical, Surgical, Social, and Family Histories have been reviewed and are referenced here-in when relevant for medical decision making.   Inpatient Medications:   aspirin EC  81 mg Oral Daily    atorvastatin  40 mg Oral Daily   clopidogrel  75 mg Oral Daily   mometasone-formoterol  2 puff Inhalation BID   montelukast  10 mg Oral Daily   tamsulosin  0.4 mg Oral Daily    Physical Exam: Vitals:   08/03/23 2337 08/04/23 0324 08/04/23 0916 08/04/23 1119  BP: (!) 115/59 120/71 128/70 114/75  Pulse: (!) 56 (!) 56 61 67  Resp: 18 16 16 17   Temp: 98.2 F (36.8 C) (!) 97.4 F (36.3 C) 98.1 F (36.7 C) 97.9 F (36.6 C)  TempSrc: Oral Oral Oral Oral  SpO2: 94% 95% 97% 99%  Weight:      Height:        GEN- NAD. A&O x 3. Normal affect. HEENT: Normocephalic, bruising to left eye / forehead Lungs- CTAB, Normal effort.  Heart- Regular rate and rhythm rate and rhythm. No M/G/R.  Extremities- No peripheral edema. no clubbing or cyanosis Skin- warm and dry, no rash or lesion. Neuro:    12-lead ECG : 08/02/23 SR 60 bpm with PVC's (personally reviewed) All prior EKG's in EPIC reviewed with no documented atrial fibrillation  Telemetry SB 59 -SR 80's (personally reviewed)  Assessment and Plan:  1. Cryptogenic stroke The patient presents with cryptogenic stroke.  The patient does not have a TEE planned for this AM.  I spoke at length with the patient about monitoring for afib with an implantable loop recorder.  Risks, benefits, and alteratives to implantable loop recorder were discussed with the patient today.   At this time, the patient is very clear in their decision to proceed with  implantable loop recorder.     Wound care was reviewed with the patient (keep incision clean and dry for 3 days). Please call with questions.      Canary Brim, NP-C, AGACNP-BC [facility] - Electrophysiology  08/04/2023, 1:19 PM   I have seen and examined this patient with Canary Brim.  Agree with above, note added to reflect my findings.  Patient presented to hospital with dizziness.  He walked into the garage door and had a lump on his head.  He had a CT scan that showed a right MCA  embolic infarct.  His workup thus far has shown no other cause.  Echo was within normal limits and he has not had any atrial fibrillation.  He currently feels well.  Other than an abrasion above his left eye, he has no acute complaints.  GEN: Well nourished, well developed, in no acute distress  HEENT: normal  Neck: no JVD, carotid bruits, or masses Cardiac: RRR; no murmurs, rubs, or gallops,no edema  Respiratory:  clear to auscultation bilaterally, normal work of breathing GI: soft, nontender, nondistended, + BS MS: no deformity or atrophy  Skin: warm and dry Neuro:  Strength and sensation are intact Psych: euthymic mood, full affect   Cryptogenic stroke: No cause has been found thus far.  He has been monitored on telemetry without atrial fibrillation.  Due to this, he would benefit from loop monitor implant.  Risks and benefits have been discussed.  Risk of bleeding and infection.  He understands the risks and is agreed to the procedure.  Elexa Kivi M. Ellijah Leffel MD 08/04/2023 2:07 PM

## 2023-08-04 NOTE — Discharge Instructions (Signed)

## 2023-08-04 NOTE — Plan of Care (Signed)

## 2023-08-04 NOTE — TOC Transition Note (Signed)
 Transition of Care [facility]) - Discharge Note   Patient Details  Name: Erik Gomez MRN: [id number] Date of Birth: [birth year]  Transition of [facility] CM/SW Contact:  Kermit Balo, RN Phone Number: 08/04/2023, 4:28 PM   Clinical Narrative:     Pt is discharging home today. Pt is from home with his spouse. They are together all the time.  Has needed DME at home: walker/ cane/ shower seat Pt was driving but spouse can provide transportation. Pt manages his own medications without any issues.  Outpatient therapy arranged with [facility]. Referral sent and [facility] will contact him for the first appointment. Family transporting home.  Final next level of care: OP Rehab Barriers to Discharge: No Barriers Identified   Patient Goals and CMS Choice     Choice offered to / list presented to : Patient, Spouse, Adult Children      Discharge Placement                       Discharge Plan and Services Additional resources added to the After Visit Summary for                                       Social Drivers of Health (SDOH) Interventions SDOH Screenings   Food Insecurity: No Food Insecurity (08/03/2023)  Housing: Low Risk  (08/03/2023)  Transportation Needs: No Transportation Needs (08/03/2023)  Utilities: Not At Risk (08/03/2023)  Social Connections: Unknown (08/03/2023)  Tobacco Use: Medium Risk (08/02/2023)     Readmission Risk Interventions     No data to display

## 2023-08-04 NOTE — Evaluation (Signed)
 Physical Therapy Evaluation Patient Details Name: Erik Gomez MRN: [id number] DOB: 09-03-1940 Today's Date: 08/04/2023  History of Present Illness  83 y.o. male presented to urgent care 2/27 for evaluation of dizziness and head injury from a fall. Pt has left lower quadrantanopsia. Transferred to [facility] where MRI shows right MCA infarct with questionable punctate left MCA/PCA infarct. DGU:[id number], GERD, former cigarette smoker, BPH, aortic atherosclerosis, vitamin D deficiency, osteoarthritis  Clinical Impression  PTA pt living with wife in single story home with one step to enter. Pt completely independent, driving and working out. Pt is currently limited in safe mobility by decreased proprioception and coordination during functional activity. Pt is mod I for bed mobility, min A for transfers and min-modA for ambulation due to decreased BoS and scissoring with turns. PT recommending OP Neuro PT at discharge. PT will continue to follow acutely.      If plan is discharge home, recommend the following: A little help with walking and/or transfers;A little help with bathing/dressing/bathroom;Assistance with cooking/housework;Direct supervision/assist for medications management;Direct supervision/assist for financial management;Assist for transportation;Help with stairs or ramp for entrance;Supervision due to cognitive status   Can travel by private vehicle    Yes    Equipment Recommendations None recommended by PT (has necessary equipment)     Functional Status Assessment Patient has had a recent decline in their functional status and demonstrates the ability to make significant improvements in function in a reasonable and predictable amount of time.     Precautions / Restrictions Precautions Precautions: Fall Precaution/Restrictions Comments: just when he hit his head on the garage door prior to this hospitalization Restrictions Weight Bearing Restrictions Per Provider Order: No       Mobility  Bed Mobility Overal bed mobility: Modified Independent             General bed mobility comments: HoB elevated and use of rails to come to EoB, no physical assist required    Transfers Overall transfer level: Needs assistance Equipment used: Rolling walker (2 wheels) Transfers: Sit to/from Stand Sit to Stand: Min assist           General transfer comment: vc for hand placement for power up and min A for steadying due to decreased BoS    Ambulation/Gait Ambulation/Gait assistance: Min assist, Mod assist Gait Distance (Feet): 50 Feet Assistive device: Rolling walker (2 wheels) Gait Pattern/deviations: Narrow base of support, Scissoring Gait velocity: very slow, despite cues for increased velocity Gait velocity interpretation: <1.31 ft/sec, indicative of household ambulator   General Gait Details: min A for steadying throughout session, pt with very narrow BoS, multimodal cues for wider BoS which pt unable to maintain for more than 2 steps. With turning pt with completely scissoring steps requiring modA for support for pt to untangle feet     Modified Rankin (Stroke Patients Only) Modified Rankin (Stroke Patients Only) Pre-Morbid Rankin Score: No symptoms Modified Rankin: Moderately severe disability     Balance Overall balance assessment: Needs assistance Sitting-balance support: Feet supported, No upper extremity supported Sitting balance-Leahy Scale: Fair     Standing balance support: Bilateral upper extremity supported, During functional activity, Reliant on assistive device for balance Standing balance-Leahy Scale: Poor                               Pertinent Vitals/Pain Pain Assessment Pain Assessment: Faces Faces Pain Scale: Hurts a little bit Pain Location: stomach unsettled, vomited at beginning of session  Pain Descriptors / Indicators:  (unsettled) Pain Intervention(s): Limited activity within patient's tolerance, Monitored  during session, Repositioned (felt better after emesis)    Home Living Family/patient expects to be discharged to:: Private residence Living Arrangements: Spouse/significant other Available Help at Discharge: Family;Available 24 hours/day Type of Home: House Home Access: Stairs to enter   [employer] of Steps: 1   Home Layout: One level Home Equipment: Agricultural consultant (2 wheels);Cane - single point;Rollator (4 wheels);Grab bars - tub/shower      Prior Function Prior Level of Function : Independent/Modified Independent             Mobility Comments: drives ADLs Comments: completely independent     Extremity/Trunk Assessment   Upper Extremity Assessment Upper Extremity Assessment: Defer to OT evaluation    Lower Extremity Assessment Lower Extremity Assessment: Generalized weakness;RLE deficits/detail RLE Sensation: decreased proprioception (difficulty with moving R LE in a way to keep BoS widened)    Cervical / Trunk Assessment Cervical / Trunk Assessment: Normal  Communication   Communication Communication: No apparent difficulties    Cognition Arousal: Alert Behavior During Therapy: WFL for tasks assessed/performed   PT - Cognitive impairments: Awareness, Memory, Sequencing, Problem solving, Safety/Judgement                       PT - Cognition Comments: pt with decreased awareness of his deficits and safety with ambulation Following commands: Impaired Following commands impaired: Follows one step commands with increased time, Follows one step commands inconsistently (unable to sustain command ie "widen your stance" " keep your feet wider apart when you step" able to maintain for a few steps but then needs reminder)     Cueing Cueing Techniques: Verbal cues, Gestural cues, Tactile cues, Visual cues     General Comments General comments (skin integrity, edema, etc.): pt with episode of emesis right after PT enters, RN notified, pt reports  feeling better afterwards, Zofran administered prior to ambulation, BP steady with positional change, daughter and wife in room at end of session        Assessment/Plan    PT Assessment Patient needs continued PT services  PT Problem List Decreased strength;Decreased activity tolerance;Decreased balance;Decreased mobility;Decreased coordination;Decreased cognition;Decreased safety awareness;Decreased knowledge of use of DME;Impaired sensation       PT Treatment Interventions DME instruction;Gait training;Stair training;Functional mobility training;Therapeutic activities;Therapeutic exercise;Balance training;Neuromuscular re-education;Cognitive remediation;Patient/family education    PT Goals (Current goals can be found in the Care Plan section)  Acute Rehab PT Goals Patient Stated Goal: go home PT Goal Formulation: With patient/family Time For Goal Achievement: 08/18/23 Potential to Achieve Goals: Good    Frequency Min 1X/week        AM-PAC PT "6 Clicks" Mobility  Outcome Measure Help needed turning from your back to your side while in a flat bed without using bedrails?: None Help needed moving from lying on your back to sitting on the side of a flat bed without using bedrails?: None Help needed moving to and from a bed to a chair (including a wheelchair)?: A Little Help needed standing up from a chair using your arms (e.g., wheelchair or bedside chair)?: A Little Help needed to walk in hospital room?: A Lot Help needed climbing 3-5 steps with a railing? : A Lot 6 Click Score: 18    End of Session Equipment Utilized During Treatment: Gait belt Activity Tolerance: Patient tolerated treatment well Patient left: in chair;with call bell/phone within reach;with chair alarm set;with family/visitor present  Nurse Communication: Mobility status;Other (comment) (emesis) PT Visit Diagnosis: Unsteadiness on feet (R26.81);Other abnormalities of gait and mobility (R26.89);Muscle weakness  (generalized) (M62.81);History of falling (Z91.81);Ataxic gait (R26.0);Difficulty in walking, not elsewhere classified (R26.2);Other symptoms and signs involving the nervous system (R29.898)    Time: 0981-1914 PT Time Calculation (min) (ACUTE ONLY): 32 min   Charges:   PT Evaluation $PT Eval Moderate Complexity: 1 Mod PT Treatments $Gait Training: 8-22 mins PT General Charges $$ ACUTE PT VISIT: 1 Visit         Rashanna Christiana B. Beverely Risen PT, DPT Acute Rehabilitation Services Please use secure chat or  Call Office [phone]   Elon Alas [facility] 08/04/2023, 12:39 PM

## 2023-08-04 NOTE — Progress Notes (Signed)
 SLP Cancellation Note  Patient Details Name: Erik Gomez MRN: [id number] DOB: 04-Nov-1940   Cancelled treatment:       Reason Eval/Treat Not Completed: Patient at procedure or test/unavailable. Pt having Loop recorder. Will continue efforts.   Royce Macadamia 08/04/2023, 2:11 PM

## 2023-08-04 NOTE — Evaluation (Signed)
 Occupational Therapy Evaluation Patient Details Name: JOHNATHAN HESKETT MRN: [id number] DOB: Dec 18, 1940 Today's Date: 08/04/2023   History of Present Illness   83 y.o. male presented to urgent care 2/27 for evaluation of dizziness and head injury from a fall. Pt has left lower quadrantanopsia. Transferred to [facility] where MRI shows right MCA infarct with questionable punctate left MCA/PCA infarct. WUJ:[id number], GERD, former cigarette smoker, BPH, aortic atherosclerosis, vitamin D deficiency, osteoarthritis     Clinical Impressions Independent prior to admission with adls and driving.  Currently is needing min A - CGA for ADLS due to L lower field cut and decreased balance. Patient has good family support.  Feel patient will benefit from OP OT.  No further OT indicated on acute.      If plan is discharge home, recommend the following:   A little help with walking and/or transfers;A little help with bathing/dressing/bathroom;Assistance with cooking/housework;Direct supervision/assist for medications management;Assist for transportation;Help with stairs or ramp for entrance     Functional Status Assessment   Patient has had a recent decline in their functional status and demonstrates the ability to make significant improvements in function in a reasonable and predictable amount of time.     Equipment Recommendations   None recommended by OT     Recommendations for Other Services         Precautions/Restrictions   Precautions Precautions: Fall Restrictions Weight Bearing Restrictions Per Provider Order: No     Mobility Bed Mobility                    Transfers Overall transfer level: Needs assistance Equipment used: Rolling walker (2 wheels) Transfers: Sit to/from Stand Sit to Stand: Contact guard assist                  Balance Overall balance assessment: Needs assistance Sitting-balance support: Feet supported, No upper extremity supported Sitting  balance-Leahy Scale: Good           Single Leg Stance - Right Leg: 5 Single Leg Stance - Left Leg: 3     Rhomberg - Eyes Opened: 30 Rhomberg - Eyes Closed: 30               ADL either performed or assessed with clinical judgement   ADL Overall ADL's : Needs assistance/impaired Eating/Feeding: Independent   Grooming: Wash/dry hands;Wash/dry face;Supervision/safety;Standing   Upper Body Bathing: Sitting;Contact guard assist   Lower Body Bathing: Sit to/from stand;Contact guard assist   Upper Body Dressing : Sitting;Contact guard assist   Lower Body Dressing: Sit to/from stand;Contact guard assist   Toilet Transfer: Rolling walker (2 wheels);Ambulation;Contact guard assist           Functional mobility during ADLs: Contact guard assist General ADL Comments: moving slowly with ambulation - very deliberate with movement     Vision Baseline Vision/History: 0 No visual deficits;1 Wears glasses Ability to See in Adequate Light: 0 Adequate Patient Visual Report: Peripheral vision impairment (Was aware / remembered that he is missing vision in the L lower quadrant) Vision Assessment?: Yes Eye Alignment: Impaired (comment) Ocular Range of Motion: Within Functional Limits Alignment/Gaze Preference: Within Defined Limits Tracking/Visual Pursuits: Able to track stimulus in all quads without difficulty Saccades: Decreased speed of saccadic movement Visual Fields: Left inferior homonymous quadranopsia Additional Comments: VC needed to scan to L prior to turning while ambulating     Perception Perception: Within Functional Limits       Praxis  Pertinent Vitals/Pain Pain Assessment Pain Assessment: Faces Faces Pain Scale: Hurts a little bit Pain Location: lower back Pain Descriptors / Indicators: Discomfort Pain Intervention(s): Limited activity within patient's tolerance     Extremity/Trunk Assessment Upper Extremity Assessment Upper Extremity  Assessment: Overall WFL for tasks assessed   Lower Extremity Assessment Lower Extremity Assessment: Defer to PT evaluation   Cervical / Trunk Assessment Cervical / Trunk Assessment: Normal   Communication Communication Communication: No apparent difficulties   Cognition Arousal: Alert Behavior During Therapy: WFL for tasks assessed/performed                                 Following commands: Intact Following commands impaired: Only follows one step commands consistently     Cueing  General Comments          Exercises     Shoulder Instructions      Home Living Family/patient expects to be discharged to:: Private residence Living Arrangements: Spouse/significant other Available Help at Discharge: Family;Available 24 hours/day Type of Home: House Home Access: Stairs to enter [employer] of Steps: 1   Home Layout: One level     Bathroom Shower/Tub: Producer, television/film/video: Handicapped height Bathroom Accessibility: Yes How Accessible: Accessible via walker Home Equipment: Shower seat;Grab bars - tub/shower          Prior Functioning/Environment Prior Level of Function : Independent/Modified Independent             Mobility Comments: drives ADLs Comments: completely independent    OT Problem List: Decreased activity tolerance;Impaired balance (sitting and/or standing);Impaired vision/perception;Decreased safety awareness   OT Treatment/Interventions:        OT Goals(Current goals can be found in the care plan section)       OT Frequency:       Co-evaluation              AM-PAC OT "6 Clicks" Daily Activity     Outcome Measure Help from another person eating meals?: None Help from another person taking care of personal grooming?: A Little Help from another person toileting, which includes using toliet, bedpan, or urinal?: A Little Help from another person bathing (including washing, rinsing, drying)?:  A Little Help from another person to put on and taking off regular upper body clothing?: A Little Help from another person to put on and taking off regular lower body clothing?: A Little 6 Click Score: 19   End of Session Equipment Utilized During Treatment: Gait belt;Rolling walker (2 wheels) Nurse Communication: Mobility status  Activity Tolerance: Patient tolerated treatment well Patient left: in chair;with call bell/phone within reach;with family/visitor present  OT Visit Diagnosis: Unsteadiness on feet (R26.81);Other abnormalities of gait and mobility (R26.89);Other symptoms and signs involving the nervous system (R29.898)                Time: 6213-0865 OT Time Calculation (min): 28 min Charges:  OT General Charges $OT Visit: 1 Visit OT Evaluation $OT Eval Moderate Complexity: 1 Mod OT Treatments $Self Care/Home Management : 8-22 mins  Hal Neer OTR/L   Malachi Bonds 08/04/2023, 4:36 PM

## 2023-08-04 NOTE — Progress Notes (Signed)
 STROKE TEAM PROGRESS NOTE   SUBJECTIVE (INTERVAL HISTORY) Wife and daughter are at the bedside.  Patient sitting in chair, eating breakfast. Discussed about loop recorder with pt and family again and he is interested and would like to proceed.    OBJECTIVE Temp:  [97.4 F (36.3 C)-98.4 F (36.9 C)] 97.9 F (36.6 C) (02/28 1119) Pulse Rate:  [56-72] 67 (02/28 1119) Cardiac Rhythm: Normal sinus rhythm (02/28 0700) Resp:  [12-18] 17 (02/28 1119) BP: (114-141)/(59-92) 114/75 (02/28 1119) SpO2:  [94 %-99 %] 99 % (02/28 1119)  Gomez results for input(s): "GLUCAP" in the last 168 hours. Recent Labs  Lab 08/02/23 2100 08/02/23 2102  NA 137 140  K 4.2 4.2  CL 105 106  CO2 22  --   GLUCOSE 152* 148*  BUN 20 19  CREATININE 1.02 1.10  CALCIUM 9.3  --    Recent Labs  Lab 08/02/23 2100  AST 22  ALT 17  ALKPHOS 69  BILITOT 0.8  PROT 6.7  ALBUMIN 4.0   Recent Labs  Lab 08/02/23 2100 08/02/23 2102 08/03/23 0625  WBC 4.1  --  6.3  NEUTROABS 3.2  --   --   HGB 13.1 13.3 12.8*  HCT 38.5* 39.0 38.2*  MCV 92.8  --  93.9  PLT 137*  --  158   Gomez results for input(s): "CKTOTAL", "CKMB", "CKMBINDEX", "TROPONINI" in the last 168 hours. Recent Labs    08/02/23 2100  LABPROT 14.1  INR 1.1   Recent Labs    08/02/23 2327  COLORURINE YELLOW  LABSPEC 1.024  PHURINE 7.0  GLUCOSEU NEGATIVE  HGBUR NEGATIVE  BILIRUBINUR NEGATIVE  KETONESUR NEGATIVE  PROTEINUR NEGATIVE  NITRITE NEGATIVE  LEUKOCYTESUR NEGATIVE       Component Value Date/Time   CHOL 165 08/03/2023 0238   TRIG 60 08/03/2023 0238   HDL 51 08/03/2023 0238   CHOLHDL 3.2 08/03/2023 0238   VLDL 12 08/03/2023 0238   LDLCALC 102 (H) 08/03/2023 0238   Lab Results  Component Value Date   HGBA1C 5.3 08/03/2023      Component Value Date/Time   LABOPIA NONE DETECTED 08/02/2023 2327   COCAINSCRNUR NONE DETECTED 08/02/2023 2327   LABBENZ NONE DETECTED 08/02/2023 2327   AMPHETMU NONE DETECTED 08/02/2023 2327   THCU  NONE DETECTED 08/02/2023 2327   LABBARB NONE DETECTED 08/02/2023 2327    Recent Labs  Lab 08/02/23 2100  ETH <10    I have personally reviewed the radiological images below and agree with the radiology interpretations.  VAS [place] LOWER EXTREMITY VENOUS (DVT) Result Date: 08/04/2023  Lower Venous DVT Study Patient Name:  [facility]  Date of Exam:   08/03/2023 Medical Rec #: [id number]          Accession #:    [id number] Date of Birth: 1940/09/22          Patient Gender: M Patient Age:   35 years Exam Location:  [facility] Procedure:      VAS [place] LOWER EXTREMITY VENOUS (DVT) Referring Phys: Scheryl Marten Devonta Blanford --------------------------------------------------------------------------------  Indications: Stroke.  Risk Factors: Trauma Recent fall. Comparison Study: None. Performing Technologist: Shona Simpson  Examination Guidelines: A complete evaluation includes B-mode imaging, spectral Doppler, color Doppler, and power Doppler as needed of all accessible portions of each vessel. Bilateral testing is considered an integral part of a complete examination. Limited examinations for reoccurring indications may be performed as noted. The reflux portion of the exam is performed with the patient in reverse  Trendelenburg.  +---------+---------------+---------+-----------+----------+--------------+ RIGHT    CompressibilityPhasicitySpontaneityPropertiesThrombus Aging +---------+---------------+---------+-----------+----------+--------------+ CFV      Full           Yes      Yes                                 +---------+---------------+---------+-----------+----------+--------------+ SFJ      Full                                                        +---------+---------------+---------+-----------+----------+--------------+ FV Prox  Full                                                        +---------+---------------+---------+-----------+----------+--------------+ FV Mid   Full                                                         +---------+---------------+---------+-----------+----------+--------------+ FV DistalFull                                                        +---------+---------------+---------+-----------+----------+--------------+ PFV      Full                                                        +---------+---------------+---------+-----------+----------+--------------+ POP      Full           Yes      Yes                                 +---------+---------------+---------+-----------+----------+--------------+ PTV      Full                                                        +---------+---------------+---------+-----------+----------+--------------+ PERO     Full                                                        +---------+---------------+---------+-----------+----------+--------------+   +---------+---------------+---------+-----------+----------+--------------+ LEFT     CompressibilityPhasicitySpontaneityPropertiesThrombus Aging +---------+---------------+---------+-----------+----------+--------------+ CFV      Full           Yes      Yes                                 +---------+---------------+---------+-----------+----------+--------------+  SFJ      Full                                                        +---------+---------------+---------+-----------+----------+--------------+ FV Prox  Full                                                        +---------+---------------+---------+-----------+----------+--------------+ FV Mid   Full                                                        +---------+---------------+---------+-----------+----------+--------------+ FV DistalFull                                                        +---------+---------------+---------+-----------+----------+--------------+ PFV      Full                                                         +---------+---------------+---------+-----------+----------+--------------+ POP      Full           Yes      Yes                                 +---------+---------------+---------+-----------+----------+--------------+ PTV      Full                                                        +---------+---------------+---------+-----------+----------+--------------+ PERO     Full                                                        +---------+---------------+---------+-----------+----------+--------------+     Summary: BILATERAL: - Gomez evidence of deep vein thrombosis seen in the lower extremities, bilaterally. -Gomez evidence of popliteal cyst, bilaterally.   *See table(s) above for measurements and observations. Electronically signed by Carolynn Sayers on 08/04/2023 at 7:13:55 AM.    Final    ECHOCARDIOGRAM COMPLETE Result Date: 08/03/2023    ECHOCARDIOGRAM REPORT   Patient Name:   Erik [facility] Date of Exam: 08/03/2023 Medical Rec #:  [id number]         Height:       70.0 in Accession #:    [id number]        Weight:  180.0 lb Date of Birth:  [birth year]         BSA:          1.996 m Patient Age:    82 years          BP:           131/71 mmHg Patient Gender: M                 HR:           55 bpm. Exam Location:  Inpatient Procedure: 2D Echo, Color Doppler and Cardiac Doppler (Both Spectral and Color            Flow Doppler were utilized during procedure). Indications:    Stroke  History:        Patient has prior history of Echocardiogram examinations and                 Patient has Gomez prior history of Echocardiogram examinations.  Sonographer:    Amy Chionchio Referring Phys: [id number] VASUNDHRA RATHORE IMPRESSIONS  1. Left ventricular ejection fraction, by estimation, is 60 to 65%. The left ventricle has normal function. The left ventricle has Gomez regional wall motion abnormalities. Left ventricular diastolic parameters are consistent with Grade I diastolic dysfunction (impaired  relaxation).  2. Right ventricular systolic function is normal. The right ventricular size is normal.  3. The mitral valve is grossly normal. Gomez evidence of mitral valve regurgitation. Gomez evidence of mitral stenosis.  4. The aortic valve is tricuspid. There is moderate calcification of the aortic valve. Aortic valve regurgitation is not visualized. Aortic valve sclerosis is present, with Gomez evidence of aortic valve stenosis.  5. Aortic dilatation noted. There is mild dilatation of the ascending aorta, measuring 40 mm.  6. The inferior vena cava is normal in size with greater than 50% respiratory variability, suggesting right atrial pressure of 3 mmHg. Comparison(s): Gomez prior Echocardiogram. FINDINGS  Left Ventricle: Left ventricular ejection fraction, by estimation, is 60 to 65%. The left ventricle has normal function. The left ventricle has Gomez regional wall motion abnormalities. Strain imaging was not performed. The left ventricular internal cavity  size was normal in size. There is Gomez left ventricular hypertrophy. Left ventricular diastolic parameters are consistent with Grade I diastolic dysfunction (impaired relaxation). Right Ventricle: The right ventricular size is normal. Gomez increase in right ventricular wall thickness. Right ventricular systolic function is normal. Left Atrium: Left atrial size was normal in size. Right Atrium: Right atrial size was normal in size. Pericardium: There is Gomez evidence of pericardial effusion. Presence of epicardial fat layer. Mitral Valve: The mitral valve is grossly normal. Gomez evidence of mitral valve regurgitation. Gomez evidence of mitral valve stenosis. MV peak gradient, 3.2 mmHg. The mean mitral valve gradient is 1.0 mmHg. Tricuspid Valve: The tricuspid valve is normal in structure. Tricuspid valve regurgitation is not demonstrated. Gomez evidence of tricuspid stenosis. Aortic Valve: The aortic valve is tricuspid. There is moderate calcification of the aortic valve. There is  mild aortic valve annular calcification. Aortic valve regurgitation is not visualized. Aortic valve sclerosis is present, with Gomez evidence of aortic valve stenosis. Aortic valve mean gradient measures 5.0 mmHg. Aortic valve peak gradient measures 9.6 mmHg. Aortic valve area, by VTI measures 2.82 cm. Pulmonic Valve: The pulmonic valve was normal in structure. Pulmonic valve regurgitation is not visualized. Gomez evidence of pulmonic stenosis. Aorta: Aortic dilatation noted. There is mild dilatation of the ascending aorta, measuring 40 mm. Venous: The inferior  vena cava is normal in size with greater than 50% respiratory variability, suggesting right atrial pressure of 3 mmHg. IAS/Shunts: The atrial septum is grossly normal. Additional Comments: 3D imaging was not performed.  LEFT VENTRICLE PLAX 2D LVIDd:         4.80 cm      Diastology LVIDs:         3.20 cm      LV e' medial:    6.53 cm/s LV PW:         0.90 cm      LV E/e' medial:  12.6 LV IVS:        0.70 cm      LV e' lateral:   8.05 cm/s LVOT diam:     2.10 cm      LV E/e' lateral: 10.3 LV SV:         85 LV SV Index:   43 LVOT Area:     3.46 cm  LV Volumes (MOD) LV vol d, MOD A2C: 95.0 ml LV vol d, MOD A4C: 110.0 ml LV vol s, MOD A2C: 31.3 ml LV vol s, MOD A4C: 43.7 ml LV SV MOD A2C:     63.7 ml LV SV MOD A4C:     110.0 ml LV SV MOD BP:      65.3 ml RIGHT VENTRICLE             IVC RV Basal diam:  3.00 cm     IVC diam: 2.00 cm RV S prime:     10.60 cm/s TAPSE (M-mode): 2.1 cm LEFT ATRIUM             Index        RIGHT ATRIUM           Index LA Vol (A2C):   60.3 ml 30.21 ml/m  RA Area:     17.70 cm LA Vol (A4C):   56.0 ml 28.06 ml/m  RA Volume:   45.70 ml  22.90 ml/m LA Biplane Vol: 61.7 ml 30.91 ml/m  AORTIC VALVE                     PULMONIC VALVE AV Area (Vmax):    2.90 cm      PV Vmax:       0.98 m/s AV Area (Vmean):   2.69 cm      PV Peak grad:  3.8 mmHg AV Area (VTI):     2.82 cm AV Vmax:           155.00 cm/s AV Vmean:          105.000 cm/s AV VTI:             0.301 m AV Peak Grad:      9.6 mmHg AV Mean Grad:      5.0 mmHg LVOT Vmax:         130.00 cm/s LVOT Vmean:        81.600 cm/s LVOT VTI:          0.245 m LVOT/AV VTI ratio: 0.81  AORTA Ao Root diam: 3.20 cm Ao Asc diam:  4.00 cm MITRAL VALVE               TRICUSPID VALVE MV Area (PHT): 3.42 cm    TR Peak grad:   26.8 mmHg MV Area VTI:   3.49 cm    TR Vmax:        259.00 cm/s MV Peak grad:  3.2  mmHg MV Mean grad:  1.0 mmHg    SHUNTS MV Vmax:       0.89 m/s    Systemic VTI:  0.24 m MV Vmean:      40.3 cm/s   Systemic Diam: 2.10 cm MV Decel Time: 222 msec MV E velocity: 82.60 cm/s MV A velocity: 49.20 cm/s MV E/A ratio:  1.68 Riley Lam MD Electronically signed by Riley Lam MD Signature Date/Time: 08/03/2023/3:36:39 PM    Final    CT ANGIO HEAD NECK W WO CM Result Date: 08/03/2023 CLINICAL DATA:  Follow-up examination for stroke. EXAM: CT ANGIOGRAPHY HEAD AND NECK WITH AND WITHOUT CONTRAST TECHNIQUE: Multidetector CT imaging of the head and neck was performed using the standard protocol during bolus administration of intravenous contrast. Multiplanar CT image reconstructions and MIPs were obtained to evaluate the vascular anatomy. Carotid stenosis measurements (when applicable) are obtained utilizing NASCET criteria, using the distal internal carotid diameter as the denominator. RADIATION DOSE REDUCTION: This exam was performed according to the departmental dose-optimization program which includes automated exposure control, adjustment of the mA and/or kV according to patient size and/or use of iterative reconstruction technique. CONTRAST:  75mL OMNIPAQUE IOHEXOL 350 MG/ML SOLN COMPARISON:  CT and MRI from 08/02/2023. FINDINGS: CTA NECK FINDINGS Aortic arch: Visualized aortic arch dilated up to 3.7 cm. Standard 3 vessel morphology. Mild aortic atherosclerosis. Gomez stenosis about the origin the great vessels. Right carotid system: Right common and internal carotid arteries are patent  without dissection. Mild atheromatous change about the right carotid bulb without hemodynamically significant greater than 50% stenosis. Left carotid system: Left common and internal carotid arteries are patent without dissection. Mild atheromatous change about the left carotid bulb without hemodynamically significant greater than 50% stenosis. Vertebral arteries: Both vertebral arteries arise from the subclavian arteries. Vertebral arteries are patent without stenosis or dissection. Skeleton: Gomez discrete or worrisome osseous lesions. Moderate spondylosis noted at C5-6 and C6-7. Other neck: Gomez other acute finding. Upper chest: Gomez other acute finding. Review of the MIP images confirms the above findings CTA HEAD FINDINGS Anterior circulation: And atheromatous change about the carotid siphons without hemodynamically significant stenosis. A1 segments patent bilaterally. Normal anterior communicating artery complex. Anterior cerebral arteries patent without stenosis. Gomez M1 stenosis or occlusion. Left MCA branches patent and well perfused. On the right, there is acute occlusion of a proximal right M3 branch, inferior division (series 8, image 237). Remainder of the right MCA branches remain patent and perfused. Posterior circulation: Both V4 segments patent without stenosis. Neither PICA origin well visualized. Basilar patent without stenosis. Superior cerebellar and posterior cerebral arteries patent bilaterally. Venous sinuses: Patent allowing for timing the contrast bolus. Anatomic variants: None significant.  Gomez aneurysm. Review of the MIP images confirms the above findings IMPRESSION: 1. Acute proximal right M3 occlusion, inferior division. 2. Otherwise wide patency of the major arterial vasculature of the head and neck. Gomez other hemodynamically significant or correctable stenosis. 3. Dilatation of the aortic arch up to 3.7 cm. Recommend annual imaging followup by CTA or MRA. This recommendation follows 2010  ACCF/AHA/AATS/ACR/ASA/SCA/SCAI/SIR/STS/SVM Guidelines for the Diagnosis and Management of Patients with Thoracic Aortic Disease. Circulation.2010; 121: U725-D664. Aortic aneurysm NOS (ICD10-I71.9) 4. Aortic Atherosclerosis (ICD10-I70.0). Electronically Signed   By: Rise Mu M.D.   On: 08/03/2023 04:35   MR Brain W and Wo Contrast Result Date: 08/03/2023 CLINICAL DATA:  Follow-up examination for stroke. EXAM: MRI HEAD WITHOUT AND WITH CONTRAST TECHNIQUE: Multiplanar, multiecho pulse sequences of the brain and surrounding  structures were obtained without and with intravenous contrast. CONTRAST:  8mL GADAVIST GADOBUTROL 1 MMOL/ML IV SOLN COMPARISON:  CT from earlier the same day. FINDINGS: Brain: Cerebral volume within normal limits. Few scattered patchy foci of T2/FLAIR hyperintensity involving the periventricular and deep white matter both cerebral hemispheres, consistent with chronic small vessel ischemic disease, minor for age. Few small remote bilateral cerebellar infarcts noted, right greater than left. Confluent restricted diffusion involving the posterior right temporal lobe, consistent with an acute right MCA distribution infarct (series 5, image 26). Minimal patchy involvement of the insula as well as the posterior right parietooccipital region noted. Area of infarction measures up to approximately 7 cm in diameter. Minimal associated petechial blood products without hemorrhagic transformation (series 10, image 23). Gomez significant regional mass effect. Additional punctate acute ischemic nonhemorrhagic infarct noted at the left occipital lobe (series 5, image 22). Gomez other evidence for acute or subacute ischemia. Gray-white matter deficient otherwise maintained. Gomez acute or chronic intracranial blood products. Gomez mass lesion, midline shift or mass effect. Gomez hydrocephalus. Cavum et septum pellucidum noted. Pituitary gland suprasellar region within normal limits. Subcentimeter partially  serpiginous focus of enhancement noted at the right occipital lobe (series 16, image 61). Finding favored to be vascular in nature or possibly related to underlying ischemic changes. Gomez other abnormal enhancement. Vascular: Susceptibility artifact at the level of the right sylvian fissure, likely reflecting a thrombosed right MCA branch given the adjacent right MCA territory infarct (series 10, image 27, 31). Major intracranial vascular flow voids are otherwise maintained. Skull and upper cervical spine: Craniocervical junction within limits. Bone marrow signal intensity normal. Small soft tissue contusion present at the left frontal scalp. Sinuses/Orbits: Prior bilateral ocular lens replacement. Scattered mucosal thickening noted about the maxillary sinuses. Small volume pneumatized secretions noted within the left sphenoid sinus. Gomez significant mastoid effusion. Other: None. IMPRESSION: 1. Acute right MCA distribution infarct, primarily involving the posterior right temporal lobe as above. Minimal associated petechial blood products without hemorrhagic transformation or significant mass effect. 2. Additional punctate acute ischemic nonhemorrhagic infarct at the left occipital lobe. 3. Underlying mild chronic microvascular ischemic disease with a few small remote cerebellar infarcts. 4. Small soft tissue contusion at the left frontal scalp. 5. Subcentimeter focus of enhancement at the right occipital lobe. Finding is nonspecific, but favored to be either vascular in nature or possibly related to underlying ischemic changes. Short interval follow-up study in 3 months time is suggested to evaluate for resolution and/or stability. Electronically Signed   By: Rise Mu M.D.   On: 08/03/2023 00:11   CT Head Wo Contrast Result Date: 08/02/2023 CLINICAL DATA:  Head trauma, moderate to severe. Patient struck head around 10 o'clock today after trip and fall injury. Hematoma to the left side of the head above  the eyebrow. Sleepier than usual and intermittent dizziness. EXAM: CT HEAD WITHOUT CONTRAST TECHNIQUE: Contiguous axial images were obtained from the base of the skull through the vertex without intravenous contrast. RADIATION DOSE REDUCTION: This exam was performed according to the departmental dose-optimization program which includes automated exposure control, adjustment of the mA and/or kV according to patient size and/or use of iterative reconstruction technique. COMPARISON:  CT paranasal sinuses 06/10/2011 FINDINGS: Brain: Mild diffuse cerebral atrophy. Mild ventricular dilatation consistent with central atrophy. Cavum septum pellucidum. Poorly defined low-attenuation changes are demonstrated in the right parietal lobe associated with loss of gray-white differentiation and sulcal effacement in this area. This is most likely to represent acute infarct but underlying  mass lesion could also have this appearance. MRI is suggested for further characterization. Gomez abnormal extra-axial fluid collections. Basal cisterns are not effaced. Gomez acute intracranial hemorrhage. Vascular: Gomez hyperdense vessel or unexpected calcification. Skull: Normal. Negative for fracture or focal lesion. Sinuses/Orbits: Mucosal thickening in the paranasal sinuses. Gomez acute air-fluid levels. Mastoid air cells are clear. Other: None. IMPRESSION: 1. Vague low-attenuation with associated sulcal effacement and loss of gray-white differentiation in the right parietal lobe. This is likely acute infarct in the distribution of the middle cerebral artery. A mass lesion could also possibly have this appearance. MRI is recommended. 2. Chronic atrophy and small vessel ischemic changes. Gomez acute intracranial hemorrhage. Electronically Signed   By: Burman Nieves M.D.   On: 08/02/2023 20:35     PHYSICAL EXAM  Temp:  [97.4 F (36.3 C)-98.4 F (36.9 C)] 97.9 F (36.6 C) (02/28 1119) Pulse Rate:  [56-72] 67 (02/28 1119) Resp:  [12-18] 17 (02/28  1119) BP: (114-141)/(59-92) 114/75 (02/28 1119) SpO2:  [94 %-99 %] 99 % (02/28 1119)  General - Well nourished, well developed, in Gomez apparent distress.  Ophthalmologic - fundi not visualized due to noncooperation.  Cardiovascular - Regular rhythm and rate.  Mental Status -  Level of arousal and orientation to time, place, and person were intact. Language including expression, naming, repetition, comprehension was assessed and found intact. Fund of Knowledge was assessed and was intact.  Cranial Nerves II - XII - II - Visual field exam showed left lower quadrantanopsia. III, IV, VI - Extraocular movements intact. V - Facial sensation intact bilaterally. VII - Facial movement intact bilaterally. VIII - Hearing & vestibular intact bilaterally. X - Palate elevates symmetrically. XI - Chin turning & shoulder shrug intact bilaterally. XII - Tongue protrusion intact.  Motor Strength - The patient's strength was normal in all extremities and pronator drift was absent.  Bulk was normal and fasciculations were absent.   Motor Tone - Muscle tone was assessed at the neck and appendages and was normal.  Reflexes - The patient's reflexes were symmetrical in all extremities and he had Gomez pathological reflexes.  Sensory - Light touch, temperature/pinprick were assessed and were symmetrical.    Coordination - The patient had normal movements in the hands and feet with Gomez ataxia or dysmetria.  Tremor was absent.  Gait and Station - deferred.   ASSESSMENT/PLAN Erik Gomez is a 83 y.o. male with history of arthritis and vitamin D deficiency admitted for head injury with bumping into garage door. Gomez TNK given due to outside window.    Stroke:  right MCA infarct likely embolic secondary to cardioembolic source CT right MCA infarct MRI right MCA infarct with questionable punctate left MCA/PCA infarct CT head and neck proximal right M3 occlusion 2D Echo EF 60 to 65% LE venous Doppler  Gomez DVT Recommend loop recorder prior to discharge LDL 102 HgbA1c 5.3 UDS negative SCDs for VTE prophylaxis Gomez antithrombotic prior to admission, now on aspirin 81 mg daily and clopidogrel 75 mg daily DAPT for 3 weeks and then aspirin alone. Patient counseled to be compliant with his antithrombotic medications Ongoing aggressive stroke risk factor management Therapy recommendations: outpt PT Disposition: Pending  BP management Stable Avoid low BP Long term BP goal normotensive  Hyperlipidemia Home meds: None LDL 102, goal < 70 Now on Lipitor 40 Continue statin at discharge  Other Stroke Risk Factors Advanced age  Other Active Problems Left forehead bruise  Hospital day # 1  Neurology will  sign off. Please call with questions. Pt will follow up with stroke clinic NP at [facility] in about 4 weeks. Thanks for the consult.   Marvel Plan, MD PhD Stroke Neurology 08/04/2023 1:00 PM    To contact Stroke Continuity provider, please refer to [email or web address]. After hours, contact General Neurology

## 2023-08-04 NOTE — Progress Notes (Signed)
 Patient returned to 3W24.  Alert and oriented x 4, room air, site dressing checked with minimal, marked drainage.  Patient repositioned in chair with chair alarm on.   Family at bedside.

## 2023-08-10 DIAGNOSIS — R2681 Unsteadiness on feet: Secondary | ICD-10-CM | POA: Diagnosis not present

## 2023-08-10 DIAGNOSIS — M6281 Muscle weakness (generalized): Secondary | ICD-10-CM | POA: Diagnosis not present

## 2023-08-10 DIAGNOSIS — I639 Cerebral infarction, unspecified: Secondary | ICD-10-CM | POA: Diagnosis not present

## 2023-08-14 ENCOUNTER — Telehealth: Payer: Self-pay | Admitting: Cardiology

## 2023-08-14 DIAGNOSIS — D696 Thrombocytopenia, unspecified: Secondary | ICD-10-CM | POA: Diagnosis not present

## 2023-08-14 DIAGNOSIS — E785 Hyperlipidemia, unspecified: Secondary | ICD-10-CM | POA: Diagnosis not present

## 2023-08-14 DIAGNOSIS — Q254 Congenital malformation of aorta unspecified: Secondary | ICD-10-CM | POA: Diagnosis not present

## 2023-08-14 DIAGNOSIS — I5032 Chronic diastolic (congestive) heart failure: Secondary | ICD-10-CM | POA: Diagnosis not present

## 2023-08-14 DIAGNOSIS — R112 Nausea with vomiting, unspecified: Secondary | ICD-10-CM | POA: Diagnosis not present

## 2023-08-14 DIAGNOSIS — J45909 Unspecified asthma, uncomplicated: Secondary | ICD-10-CM | POA: Diagnosis not present

## 2023-08-14 DIAGNOSIS — I639 Cerebral infarction, unspecified: Secondary | ICD-10-CM | POA: Diagnosis not present

## 2023-08-14 DIAGNOSIS — H547 Unspecified visual loss: Secondary | ICD-10-CM | POA: Diagnosis not present

## 2023-08-14 DIAGNOSIS — E663 Overweight: Secondary | ICD-10-CM | POA: Diagnosis not present

## 2023-08-14 NOTE — Telephone Encounter (Signed)
 Spoke to pt, made aware that he does not need to see [place] unless we find something on the ILR.  Advised on when to call the office otherwise pt understands we will call him if something abn shows on the monitor. Patient verbalized understanding and agreeable to plan.

## 2023-08-14 NOTE — Telephone Encounter (Signed)
 Wife Elease Hashimoto) wants a call back as she has questions regarding post loop recorder follow-up process.

## 2023-08-15 DIAGNOSIS — I639 Cerebral infarction, unspecified: Secondary | ICD-10-CM | POA: Diagnosis not present

## 2023-08-15 DIAGNOSIS — R2681 Unsteadiness on feet: Secondary | ICD-10-CM | POA: Diagnosis not present

## 2023-08-15 DIAGNOSIS — M6281 Muscle weakness (generalized): Secondary | ICD-10-CM | POA: Diagnosis not present

## 2023-08-16 DIAGNOSIS — Z822 Family history of deafness and hearing loss: Secondary | ICD-10-CM | POA: Diagnosis not present

## 2023-08-16 DIAGNOSIS — Z570 Occupational exposure to noise: Secondary | ICD-10-CM | POA: Diagnosis not present

## 2023-08-16 DIAGNOSIS — H903 Sensorineural hearing loss, bilateral: Secondary | ICD-10-CM | POA: Diagnosis not present

## 2023-08-16 DIAGNOSIS — H9313 Tinnitus, bilateral: Secondary | ICD-10-CM | POA: Diagnosis not present

## 2023-08-17 ENCOUNTER — Telehealth: Payer: Self-pay | Admitting: Pulmonary Disease

## 2023-08-17 DIAGNOSIS — M6281 Muscle weakness (generalized): Secondary | ICD-10-CM | POA: Diagnosis not present

## 2023-08-17 DIAGNOSIS — R2681 Unsteadiness on feet: Secondary | ICD-10-CM | POA: Diagnosis not present

## 2023-08-17 DIAGNOSIS — I639 Cerebral infarction, unspecified: Secondary | ICD-10-CM | POA: Diagnosis not present

## 2023-08-17 NOTE — Telephone Encounter (Signed)
 PT states he had a CT scan done 1 mo ago and he would like the results. His # is [phone]

## 2023-08-17 NOTE — Telephone Encounter (Signed)
 Called and spoke to patient.  He is requesting CT results.  Dr. Isaiah Serge, please advise. Thanks

## 2023-08-19 NOTE — Telephone Encounter (Signed)
 I called and reviewed CT results with patient. Nothing further needed

## 2023-08-22 DIAGNOSIS — L814 Other melanin hyperpigmentation: Secondary | ICD-10-CM | POA: Diagnosis not present

## 2023-08-22 DIAGNOSIS — D0359 Melanoma in situ of other part of trunk: Secondary | ICD-10-CM | POA: Diagnosis not present

## 2023-08-22 DIAGNOSIS — B356 Tinea cruris: Secondary | ICD-10-CM | POA: Diagnosis not present

## 2023-08-22 DIAGNOSIS — Z85828 Personal history of other malignant neoplasm of skin: Secondary | ICD-10-CM | POA: Diagnosis not present

## 2023-08-22 DIAGNOSIS — D692 Other nonthrombocytopenic purpura: Secondary | ICD-10-CM | POA: Diagnosis not present

## 2023-08-22 DIAGNOSIS — L821 Other seborrheic keratosis: Secondary | ICD-10-CM | POA: Diagnosis not present

## 2023-08-22 DIAGNOSIS — D485 Neoplasm of uncertain behavior of skin: Secondary | ICD-10-CM | POA: Diagnosis not present

## 2023-08-23 DIAGNOSIS — M6281 Muscle weakness (generalized): Secondary | ICD-10-CM | POA: Diagnosis not present

## 2023-08-23 DIAGNOSIS — R2681 Unsteadiness on feet: Secondary | ICD-10-CM | POA: Diagnosis not present

## 2023-08-23 DIAGNOSIS — I639 Cerebral infarction, unspecified: Secondary | ICD-10-CM | POA: Diagnosis not present

## 2023-08-25 DIAGNOSIS — M6281 Muscle weakness (generalized): Secondary | ICD-10-CM | POA: Diagnosis not present

## 2023-08-25 DIAGNOSIS — R2681 Unsteadiness on feet: Secondary | ICD-10-CM | POA: Diagnosis not present

## 2023-08-25 DIAGNOSIS — I639 Cerebral infarction, unspecified: Secondary | ICD-10-CM | POA: Diagnosis not present

## 2023-09-04 DIAGNOSIS — Z85828 Personal history of other malignant neoplasm of skin: Secondary | ICD-10-CM | POA: Diagnosis not present

## 2023-09-04 DIAGNOSIS — D0359 Melanoma in situ of other part of trunk: Secondary | ICD-10-CM | POA: Diagnosis not present

## 2023-09-05 NOTE — Progress Notes (Addendum)
 [facility] [address] [place]. [place] [address] [phone]       HOSPITAL FOLLOW UP NOTE  [name] Date of Birth:  1941-05-14 Medical Record Number:  [id number]   Reason for Referral:  hospital stroke follow up    SUBJECTIVE:   CHIEF COMPLAINT:  Chief Complaint  Patient presents with   Follow-up    Patient in room #3 with his wife and daughter. Patient states he is well and stable, with no new concerns. 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    HPI:   Erik Gomez is a 83 y.o. male with history of arthritis and vitamin D deficiency who presented to ED on 08/02/2023 after head injury with bumping into garage door.  CT scan showed right MCA infarct which was confirmed by MRI as well as questionable punctate left MCA/PCA infarct.  CTA head/neck proximal right M3 occlusion.  EF 60 to 65%.  LE Doppler negative for DVT.  Recommended loop recorder prior to discharge to evaluate for A-fib.  LDL 102.  A1c 5.3.  Recommended DAPT for 3 weeks and aspirin  alone and also initiated atorvastatin  40 mg daily.  Exam showed left lower quadrantanopia.  Therapies recommended outpatient PT.   Today, 09/06/2023, patient is being seen for initial hospital follow-up accompanied by his wife and daughter. Overall he has been doing well since discharge. Has not noticed any issues with his vision. Believes gait is good, completed PT at [facility] after 4 visits.  Ambulates without AD, no recent falls.  He has not yet returned back to driving as he was advised not to at hospital discharge due to residual visual impairment.  He does routinely follow with ophthalmology Dr. Roslynn Gomez with yearly schedule follow-up visit in November.  Denies any issues with weakness, speech, swallowing or cognitive changes.  He does have some mild prestroke short-term memory difficulties which he attributes to age.  No new  stroke/TIA symptoms.  Completed 3 weeks DAPT, remains on aspirin  alone as well as atorvastatin  without side effects.  Blood pressure well-controlled.  Has since been seen at PCP office since discharge and has follow-up visit at the end of this month.  Initial ILR download scheduled 4/7, does have residual tape over insertion site and questions if this can be removed.     PERTINENT IMAGING  CT right MCA infarct MRI right MCA infarct with questionable punctate left MCA/PCA infarct CT head and neck proximal right M3 occlusion 2D Echo EF 60 to 65% LE venous Doppler no DVT LDL 102 HgbA1c 5.3 UDS negative    ROS:   14 system review of systems performed and negative with exception of those listed in HPI  PMH:  Past Medical History:  Diagnosis Date   Aortic atherosclerosis (HCC) 02/22/2023   Recvd call (03/07/23) from [facility] with results - seen on 02/22/23 CT chest Xray   Asthma, intrinsic    Cataract    per pt "beginning of a cataract" - bilateral   Chronic headache    GERD (gastroesophageal reflux disease)    w/ LPR   Hiatal hernia    Internal hemorrhoid    Osteoarthritis 02/05/2021   Peyronie disease    Pneumonia    x 1 as a child   Vitamin D deficiency     PSH:  Past Surgical History:  Procedure Laterality Date   CATARACT EXTRACTION, BILATERAL Bilateral  2018   COLONOSCOPY     FINGER SURGERY     middle finger of right hand   FOREIGN BODY REMOVAL  03/09/2023   Procedure: FOREIGN BODY REMOVAL;  Surgeon: Mannam, Praveen, MD;  Location: [facility] ENDOSCOPY;  Service: Cardiopulmonary;;  crown   KNEE SURGERY     Left   LOOP RECORDER INSERTION N/A 08/04/2023   Procedure: LOOP RECORDER INSERTION;  Surgeon: Erik Pump, MD;  Location: [facility] INVASIVE CV LAB;  Service: Cardiovascular;  Laterality: N/A;   Peyronie's Disease surgery  2001   SHOULDER SURGERY Right    at [facility]   SKIN CANCER EXCISION     Right leg   VIDEO BRONCHOSCOPY N/A 03/09/2023    Procedure: VIDEO BRONCHOSCOPY WITH FLUORO;  Surgeon: Mannam, Praveen, MD;  Location: [facility] ENDOSCOPY;  Service: Cardiopulmonary;  Laterality: N/A;    Social History:  Social History   Socioeconomic History   Marital status: Married    Spouse name: Not on file   Number of children: Not on file   Years of education: Not on file   Highest education level: Not on file  Occupational History   Occupation: driver and maintenance  Tobacco Use   Smoking status: Former    Current packs/day: 0.00    Average packs/day: 2.0 packs/day for 6.0 years (12.0 ttl pk-yrs)    Types: Cigarettes    Start date: 06/07/1959    Quit date: 06/06/1965    Years since quitting: 58.2   Smokeless tobacco: Never   Tobacco comments:    quit 45 yrs ago   Vaping Use   Vaping status: Never Used  Substance and Sexual Activity   Alcohol  use: Yes    Alcohol /week: 1.0 - 2.0 standard drink of alcohol     Types: 1 - 2 Glasses of wine per week    Comment: wine   Drug use: No   Sexual activity: Not Currently    Partners: Female  Other Topics Concern   Not on file  Social History Narrative   Caffeine  daily   Social Drivers of Health   Financial Resource Strain: Not on file  Food Insecurity: No Food Insecurity (08/03/2023)   Hunger Vital Sign    Worried About Running Out of Food in the Last Year: Never true    Ran Out of Food in the Last Year: Never true  Transportation Needs: No Transportation Needs (08/03/2023)   PRAPARE - Administrator, Civil Service (Medical): No    Lack of Transportation (Non-Medical): No  Physical Activity: Not on file  Stress: Not on file  Social Connections: Unknown (08/03/2023)   Social Connection and Isolation Panel [NHANES]    Frequency of Communication with Friends and Family: More than three times a week    Frequency of Social Gatherings with Friends and Family: Twice a week    Attends Religious Services: Patient declined    Database administrator or Organizations: No     Attends Banker Meetings: Patient declined    Marital Status: Married  Catering manager Violence: Not At Risk (08/03/2023)   Humiliation, Afraid, Rape, and Kick questionnaire    Fear of Current or Ex-Partner: No    Emotionally Abused: No    Physically Abused: No    Sexually Abused: No    Family History:  Family History  Problem Relation Age of Onset   Heart disease Mother    Heart disease Father    Diabetes Father  Diabetes Sister    Diabetes Brother    Colon cancer Neg Hx    Esophageal cancer Neg Hx    Stomach cancer Neg Hx    Rectal cancer Neg Hx     Medications:   Current Outpatient Medications on File Prior to Visit  Medication Sig Dispense Refill   Albuterol  Sulfate (PROAIR  RESPICLICK) 108 (90 Base) MCG/ACT AEPB Inhale 2 puffs into the lungs every 6 (six) hours as needed. 1 each 3   aspirin  EC 81 MG tablet Take 1 tablet (81 mg total) by mouth daily. Swallow whole. 30 tablet 12   atorvastatin  (LIPITOR) 40 MG tablet Take 1 tablet (40 mg total) by mouth daily. 30 tablet 3   Biotin 1 MG CAPS Take 1 capsule by mouth daily.     budesonide -formoterol  (SYMBICORT ) 160-4.5 MCG/ACT inhaler Inhale 2 puffs into the lungs 2 (two) times daily. 3 each 3   cetirizine (ZYRTEC) 10 MG tablet Take 10 mg by mouth at bedtime.     montelukast  (SINGULAIR ) 10 MG tablet Take 1 tablet (10 mg total) by mouth daily. 90 tablet 3   Multiple Vitamins-Minerals (LUTEIN-ZEAXANTHIN) TABS Take 2 mg by mouth daily.     tamsulosin  (FLOMAX ) 0.4 MG CAPS capsule Take 0.4 mg by mouth daily.     vitamin D, CHOLECALCIFEROL, 400 UNITS tablet Take 1,000 Units by mouth daily.     clopidogrel  (PLAVIX ) 75 MG tablet Take 1 tablet (75 mg total) by mouth daily. (Patient not taking: Reported on 09/06/2023) 20 tablet 0   No current facility-administered medications on file prior to visit.    Allergies:   Allergies  Allergen Reactions   Codeine     Upset stomach   Gabapentin Other (See Comments)   Meloxicam      Upset stomach   Pantoprazole Sodium     Upset stomach   Sulfa Antibiotics Nausea Only      OBJECTIVE:  Physical Exam  Vitals:   09/06/23 0946  BP: 127/76  Pulse: (!) 59  Weight: 181 lb 9.6 oz (82.4 kg)  Height: 5\' 10"  (1.778 m)   Body mass index is 26.06 kg/m. No results found.  General: well developed, well nourished, very pleasant elderly Caucasian male, seated, in no evident distress Head: head normocephalic and atraumatic.   Neck: supple with no carotid or supraclavicular bruits Cardiovascular: regular rate and rhythm, no murmurs Musculoskeletal: no deformity Skin:  no rash/petichiae; steri-strips mostly intact over left lower parasternal region with dried blood product underneath Vascular:  Normal pulses all extremities   Neurologic Exam Mental Status: Awake and fully alert.  Fluent speech and language.  Oriented to place and time. Recent and remote memory intact. Attention span, concentration and fund of knowledge appropriate. Mood and affect appropriate.  Cranial Nerves: Fundoscopic exam reveals sharp disc margins. Pupils equal, briskly reactive to light. Extraocular movements full without nystagmus. Visual fields partially impaired left lower quadrantanopsia (able to see fingers move in all 4 quadrants but difficulty counting fingers accurately in left lower quadrant).  HOH bilaterally. Facial sensation intact. Face, tongue, palate moves normally and symmetrically.  Motor: Normal bulk and tone. Normal strength in all tested extremity muscles Sensory.: intact to touch , pinprick , position and vibratory sensation.  Coordination: Rapid alternating movements normal in all extremities. Finger-to-nose and heel-to-shin performed accurately bilaterally. Gait and Station: Arises from chair without difficulty. Stance is normal. Gait demonstrates normal stride length and balance without use of AD.  Reflexes: 1+ and symmetric. Toes downgoing.  NIHSS  1 Modified Rankin   2      ASSESSMENT: Erik Gomez is a 83 y.o. year old male with large right MCA and questionable left MCA/PCA infarct in 07/2023 likely embolic secondary to unclear source s/p ILR. Vascular risk factors include HLD and advanced age.      PLAN:  Cryptogenic stroke:  Residual deficit: Left homonymous  inferior quadrantanopia although suspect more of a blurred vision in lower quadrant as able to see fingers move but difficulty counting number of fingers accurately.  Would recommend evaluation with his ophthalmologist for peripheral field testing to further determine safety of returning back to driving. Loop recorder to be monitored by cardiology, initial download scheduled 4/7. Will reach out to cardiology regarding patients concern of steri-strips still in place, he was advised not to remove himself and to let fall off but he is concerned they are still intact. Will request cardiology follow up with patient.  Continue aspirin  81mg  daily and atorvastatin  (Lipitor) for secondary stroke prevention managed/prescribed by PCP.   Discussed secondary stroke prevention measures and importance of close PCP follow up for aggressive stroke risk factor management including BP goal<130/90, HLD with LDL goal<70 and DM with A1c.<7 .  Stroke labs 07/2023: LDL 102, A1c 5.3 I have gone over the pathophysiology of stroke, warning signs and symptoms, risk factors and their management in some detail with instructions to go to the closest emergency room for symptoms of concern.  ADDENDUM 11/24/2023 JM: Received report from [facility] from exam on 10/19/2023 who noted inferior left sided quadrantanopia consistent with right sided stroke.  No mention regarding driving in report. Will defer return to driving to ophthalmology as previously advised.  Will send report to medical records to be scanned in his chart.   Overall stable from stroke standpoint without further recommendations and closely  followed by PCP.  Patient can follow-up in office as needed but advised to call with any stroke related questions or concerns in the future   CC:  [facility] provider: Dr. Janett Medin PCP: Suan Elm, MD    I spent 55 minutes of face-to-face and non-face-to-face time with patient and family.  This included previsit chart review including extensive review of hospitalization, lab review, study review, order entry, electronic health record documentation, patient and family education and discussion regarding above diagnoses and treatment plan and answered all other questions to patient and family's satisfaction  Erik Gomez, [facility]  [facility] [address] [place], [place] [address]  Phone [phone] Fax [phone] Note: This document was prepared with digital dictation and possible smart phrase technology. Any transcriptional errors that result from this process are unintentional.

## 2023-09-06 ENCOUNTER — Ambulatory Visit: Admitting: Adult Health

## 2023-09-06 ENCOUNTER — Encounter: Payer: Self-pay | Admitting: Adult Health

## 2023-09-06 VITALS — BP 127/76 | HR 59 | Ht 70.0 in | Wt 181.6 lb

## 2023-09-06 DIAGNOSIS — H53462 Homonymous bilateral field defects, left side: Secondary | ICD-10-CM

## 2023-09-06 DIAGNOSIS — I639 Cerebral infarction, unspecified: Secondary | ICD-10-CM

## 2023-09-06 NOTE — Progress Notes (Signed)
 I agree with the above plan

## 2023-09-06 NOTE — Patient Instructions (Signed)
 Would recommend follow up with Dr. Burgess Estelle to complete a visual field testing and further determine return back to driving   Loop recorder will be monitored by cardiology, they will call you with any concerning findings or with any difficulty obtaining download from her device  Continue aspirin 81 mg daily  and atorvastatin 40 mg daily for secondary stroke prevention  Continue to follow up with PCP regarding blood pressure and cholesterol management  Maintain strict control of hypertension with blood pressure goal below 130/90 and cholesterol with LDL cholesterol (bad cholesterol) goal below 70 mg/dL.   Signs of a Stroke? Follow the BEFAST method:  Balance Watch for a sudden loss of balance, trouble with coordination or vertigo Eyes Is there a sudden loss of vision in one or both eyes? Or double vision?  Face: Ask the person to smile. Does one side of the face droop or is it numb?  Arms: Ask the person to raise both arms. Does one arm drift downward? Is there weakness or numbness of a leg? Speech: Ask the person to repeat a simple phrase. Does the speech sound slurred/strange? Is the person confused ? Time: If you observe any of these signs, call 911.      Thank you for coming to see [place] at [facility]. I hope we have been able to provide you high quality care today.  You may receive a patient satisfaction survey over the next few weeks. We would appreciate your feedback and comments so that we may continue to improve ourselves and the health of our patients.

## 2023-09-06 NOTE — Telephone Encounter (Signed)
 Patient was seen today at [facility] for hospital stroke follow up. He has concerns regarding steri-strips still being intact from ILR placement. He was encouraged to leave alone and will fall off on their own but he is greatly concerned as they are still in place. Would you be able to follow up with him on this and give further recommendations? Thank you!

## 2023-09-06 NOTE — Telephone Encounter (Signed)
 Spoke to wife.  Advised wife to go ahead and remove steri strips as his ILR incision site should be completely healed at this point.  Made her aware that they typically fall off on their own after several weeks.  She removed them while I was on the phone, per her request.  States there is residual "stickiness" on pt skin - advised to wash w/ warm soapy water and that should take off the sticky residue.  She is agreeable to plan and appreciates my taking time to help with this and explain that we (EP) will see pt ONLY if something is found on the ILR.  Explained that if we find anything we will contact them, otherwise "no news is good news".  She was very appreciative of taking the time to explain things to her.

## 2023-09-11 ENCOUNTER — Ambulatory Visit (INDEPENDENT_AMBULATORY_CARE_PROVIDER_SITE_OTHER)

## 2023-09-11 DIAGNOSIS — I639 Cerebral infarction, unspecified: Secondary | ICD-10-CM | POA: Diagnosis not present

## 2023-09-12 LAB — CUP PACEART REMOTE DEVICE CHECK
Date Time Interrogation Session: 20250407221811
Implantable Pulse Generator Implant Date: 20250228

## 2023-09-25 ENCOUNTER — Telehealth: Payer: Self-pay | Admitting: Adult Health

## 2023-09-25 NOTE — Telephone Encounter (Signed)
 Called the patient back and there was no answer. Left a detailed message advising the pt that Camilo Cella explained why they dc the med but that now he is no longer taking plavix  he can resume prior dose of omeprazole . Instructed the pt to call back if he has questions.  Gabriel John RN

## 2023-09-25 NOTE — Telephone Encounter (Signed)
 Omeprazole  can decrease the effectiveness of clopidogrel .  As he is now off clopidogrel  and now on aspirin  alone, he can resume prior dose of omeprazole .

## 2023-09-25 NOTE — Telephone Encounter (Signed)
 Pt called wanting to know why he was taken off of the omeprazole  (PRILOSEC) 40 MG capsule. He states that when he was in the hosp they took him off. Please advise.

## 2023-10-02 DIAGNOSIS — N401 Enlarged prostate with lower urinary tract symptoms: Secondary | ICD-10-CM | POA: Diagnosis not present

## 2023-10-02 DIAGNOSIS — R946 Abnormal results of thyroid function studies: Secondary | ICD-10-CM | POA: Diagnosis not present

## 2023-10-02 DIAGNOSIS — E785 Hyperlipidemia, unspecified: Secondary | ICD-10-CM | POA: Diagnosis not present

## 2023-10-02 DIAGNOSIS — Z79899 Other long term (current) drug therapy: Secondary | ICD-10-CM | POA: Diagnosis not present

## 2023-10-09 DIAGNOSIS — N401 Enlarged prostate with lower urinary tract symptoms: Secondary | ICD-10-CM | POA: Diagnosis not present

## 2023-10-09 DIAGNOSIS — K219 Gastro-esophageal reflux disease without esophagitis: Secondary | ICD-10-CM | POA: Diagnosis not present

## 2023-10-09 DIAGNOSIS — Z0000 Encounter for general adult medical examination without abnormal findings: Secondary | ICD-10-CM | POA: Diagnosis not present

## 2023-10-09 DIAGNOSIS — R270 Ataxia, unspecified: Secondary | ICD-10-CM | POA: Diagnosis not present

## 2023-10-09 DIAGNOSIS — Z1339 Encounter for screening examination for other mental health and behavioral disorders: Secondary | ICD-10-CM | POA: Diagnosis not present

## 2023-10-09 DIAGNOSIS — H5340 Unspecified visual field defects: Secondary | ICD-10-CM | POA: Diagnosis not present

## 2023-10-09 DIAGNOSIS — I5032 Chronic diastolic (congestive) heart failure: Secondary | ICD-10-CM | POA: Diagnosis not present

## 2023-10-09 DIAGNOSIS — R82998 Other abnormal findings in urine: Secondary | ICD-10-CM | POA: Diagnosis not present

## 2023-10-09 DIAGNOSIS — Z1331 Encounter for screening for depression: Secondary | ICD-10-CM | POA: Diagnosis not present

## 2023-10-09 DIAGNOSIS — J449 Chronic obstructive pulmonary disease, unspecified: Secondary | ICD-10-CM | POA: Diagnosis not present

## 2023-10-16 ENCOUNTER — Ambulatory Visit (INDEPENDENT_AMBULATORY_CARE_PROVIDER_SITE_OTHER)

## 2023-10-16 DIAGNOSIS — I639 Cerebral infarction, unspecified: Secondary | ICD-10-CM

## 2023-10-17 DIAGNOSIS — N401 Enlarged prostate with lower urinary tract symptoms: Secondary | ICD-10-CM | POA: Diagnosis not present

## 2023-10-17 DIAGNOSIS — R350 Frequency of micturition: Secondary | ICD-10-CM | POA: Diagnosis not present

## 2023-10-17 LAB — CUP PACEART REMOTE DEVICE CHECK
Date Time Interrogation Session: 20250512223644
Implantable Pulse Generator Implant Date: 20250228

## 2023-10-18 ENCOUNTER — Ambulatory Visit: Payer: Self-pay | Admitting: Cardiology

## 2023-10-19 DIAGNOSIS — H5340 Unspecified visual field defects: Secondary | ICD-10-CM | POA: Diagnosis not present

## 2023-10-25 NOTE — Addendum Note (Signed)
 Addended by: Edra Govern D on: 10/25/2023 05:15 PM   Modules accepted: Orders

## 2023-10-25 NOTE — Progress Notes (Signed)
 [facility] Summary Report / Loop Recorder

## 2023-10-31 ENCOUNTER — Telehealth: Payer: Self-pay | Admitting: Adult Health

## 2023-10-31 NOTE — Telephone Encounter (Signed)
 Pt called in regards to having a field vision test  that Dr. Roslynn Coombes sent over and Pt  want doctor feed back on those results .

## 2023-10-31 NOTE — Telephone Encounter (Signed)
 Cld Pt to make him aware our office has not received the results from Dr. Roslynn Coombes. Gave Pt POD 3 fax for results to be sent. Pt would like a call from [facility] for her input once she has reviewed the results.

## 2023-11-02 NOTE — Telephone Encounter (Signed)
 Received the report and placed in Rosa, NP's bin to view

## 2023-11-09 NOTE — Telephone Encounter (Signed)
 Addendum to phone note of 11/09/23: Pt did report he has a field of vision test in Nov 2025.

## 2023-11-09 NOTE — Telephone Encounter (Signed)
 Cld Pt to discuss NP review of ophthalmology report including where NP did not see where they mentioned it was ok for him to return to driving from a vision standpoint and he needs to reach out to his ophthalmologist to discuss driving. Pt voiced understanding, had no questions, and thankful for the call.

## 2023-11-09 NOTE — Telephone Encounter (Signed)
 Ophthalmology report reviewed, noted left lower vision loss bilaterally (left inferior quadrantanopia) which is consistent with his stroke.  Unfortunately, it was difficult to read all of the report as writing was not consistently legible but I did not see where they mentioned okay to return to driving from a vision standpoint. If this was not discussed at that visit, I would advise him to reach back out to his ophthalmologist to discuss this.

## 2023-11-09 NOTE — Telephone Encounter (Signed)
 Pt call back request to speak to [facility] about Test Results from Dr. Roslynn Coombes .Informed Pt that Camilo Cella has received fax and will give him a call soon .

## 2023-11-15 DIAGNOSIS — Z23 Encounter for immunization: Secondary | ICD-10-CM | POA: Diagnosis not present

## 2023-11-16 ENCOUNTER — Ambulatory Visit

## 2023-11-16 DIAGNOSIS — I639 Cerebral infarction, unspecified: Secondary | ICD-10-CM

## 2023-11-17 LAB — CUP PACEART REMOTE DEVICE CHECK
Date Time Interrogation Session: 20250612221924
Implantable Pulse Generator Implant Date: 20250228

## 2023-11-20 ENCOUNTER — Encounter

## 2023-11-21 ENCOUNTER — Ambulatory Visit: Payer: Self-pay | Admitting: Cardiology

## 2023-11-29 DIAGNOSIS — M542 Cervicalgia: Secondary | ICD-10-CM | POA: Diagnosis not present

## 2023-11-29 DIAGNOSIS — M47812 Spondylosis without myelopathy or radiculopathy, cervical region: Secondary | ICD-10-CM | POA: Diagnosis not present

## 2023-12-04 DIAGNOSIS — M542 Cervicalgia: Secondary | ICD-10-CM | POA: Diagnosis not present

## 2023-12-04 NOTE — Progress Notes (Signed)
 [facility] Summary Report / Loop Recorder

## 2023-12-04 NOTE — Addendum Note (Signed)
 Addended by: TAWNI DRILLING D on: 12/04/2023 12:02 PM   Modules accepted: Orders

## 2023-12-07 DIAGNOSIS — M542 Cervicalgia: Secondary | ICD-10-CM | POA: Diagnosis not present

## 2023-12-13 DIAGNOSIS — M542 Cervicalgia: Secondary | ICD-10-CM | POA: Diagnosis not present

## 2023-12-18 ENCOUNTER — Ambulatory Visit

## 2023-12-18 DIAGNOSIS — I639 Cerebral infarction, unspecified: Secondary | ICD-10-CM

## 2023-12-18 LAB — CUP PACEART REMOTE DEVICE CHECK
Date Time Interrogation Session: 20250714004850
Implantable Pulse Generator Implant Date: 20250228

## 2023-12-19 ENCOUNTER — Ambulatory Visit: Payer: Self-pay | Admitting: Cardiology

## 2023-12-20 ENCOUNTER — Telehealth: Payer: Self-pay

## 2023-12-20 NOTE — Telephone Encounter (Signed)
 Alert remote transmission:  AF (ILR Implant) 1 new AF event 7/16 @ 00:39, and ongoing at time of transmission (01:39 AM), mean HR 97, burden 0.1%.  No hx of PAF, ASA only   ___________________________________________________________________  Erik Gomez with patient regarding recent onset of AF occurring the morning of 7/16. Pt is asymptomatic and unaware he converted to AF. Pt educated on AF and risks associated. Informed patient of AF clinic referral for possible OAC initiation. Device clinic number given for any questions or concerns. Patient appreciative for call.  Reviewed with Dr. Inocencio and agrees with referral to AF Clinic for [facility] initiation.  Forwarding this message to AF clinic to establish care.

## 2023-12-21 DIAGNOSIS — M542 Cervicalgia: Secondary | ICD-10-CM | POA: Diagnosis not present

## 2023-12-25 ENCOUNTER — Ambulatory Visit (HOSPITAL_COMMUNITY)
Admission: RE | Admit: 2023-12-25 | Discharge: 2023-12-25 | Disposition: A | Discharge status: Home / Self Care | Source: Ambulatory Visit | Attending: Physician Assistant | Admitting: Physician Assistant

## 2023-12-25 ENCOUNTER — Encounter

## 2023-12-25 VITALS — BP 118/70 | HR 55 | Ht 70.0 in | Wt 176.8 lb

## 2023-12-25 DIAGNOSIS — I480 Paroxysmal atrial fibrillation: Secondary | ICD-10-CM | POA: Diagnosis not present

## 2023-12-25 DIAGNOSIS — I4891 Unspecified atrial fibrillation: Secondary | ICD-10-CM | POA: Diagnosis not present

## 2023-12-25 DIAGNOSIS — D6869 Other thrombophilia: Secondary | ICD-10-CM

## 2023-12-25 MED ORDER — APIXABAN 5 MG PO TABS: 5.0000 mg | ORAL_TABLET | Freq: Two times a day (BID) | ORAL | 3 refills | Status: DC

## 2023-12-25 NOTE — Progress Notes (Signed)
 Primary Care Physician: Shepard Ade, MD Primary Cardiologist: None Electrophysiologist: Will Gladis Norton, MD  Referring Physician: Dr Norton Armond Erik Gomez is a 83 y.o. male with a history of asthma, former smoker, aortic atherosclerosis, atrial fibrillation who presents for follow up in the [facility].  The patient was hospitalized 07/2023 with an acute cryptogenic CVA. EP consulted for ILR insertion. The device clinic received an alert for an afib episode on 12/20/23 lasting 39 minutes. Patient was unaware of his arrhythmia.    Patient presents today for follow up for atrial fibrillation. He is in SR today. No further episodes of afib detected on his ILR. He denies significant snoring or alcohol  use.   Today, he denies symptoms of palpitations, chest pain, shortness of breath, orthopnea, PND, lower extremity edema, dizziness, presyncope, syncope, snoring, daytime somnolence, bleeding, or neurologic sequela. The patient is tolerating medications without difficulties and is otherwise without complaint today.    Atrial Fibrillation Risk Factors:  he does not have symptoms or diagnosis of sleep apnea. he does not have a history of rheumatic fever. he does not have a history of alcohol  use. The patient does not have a history of early familial atrial fibrillation or other arrhythmias.  Atrial Fibrillation Management history:  Previous antiarrhythmic drugs: none Previous cardioversions: none Previous ablations: none Anticoagulation history: none  ROS- All systems are reviewed and negative except as per the HPI above.  Past Medical History:  Diagnosis Date   Aortic atherosclerosis (HCC) 02/22/2023   Recvd call (03/07/23) from [facility] with results - seen on 02/22/23 CT chest Xray   Asthma, intrinsic    Cataract    per pt beginning of a cataract - bilateral   Chronic headache    GERD (gastroesophageal reflux disease)    w/ LPR    Hiatal hernia    Internal hemorrhoid    Osteoarthritis 02/05/2021   Peyronie disease    Pneumonia    x 1 as a child   Vitamin D deficiency     Current Outpatient Medications  Medication Sig Dispense Refill   Albuterol  Sulfate (PROAIR  RESPICLICK) 108 (90 Base) MCG/ACT AEPB Inhale 2 puffs into the lungs every 6 (six) hours as needed. 1 each 3   aspirin  EC 81 MG tablet Take 1 tablet (81 mg total) by mouth daily. Swallow whole. 30 tablet 12   atorvastatin  (LIPITOR) 40 MG tablet Take 1 tablet (40 mg total) by mouth daily. 30 tablet 3   Biotin 1 MG CAPS Take 1 capsule by mouth daily.     budesonide -formoterol  (BREYNA ) 160-4.5 MCG/ACT inhaler Inhale 1 puff into the lungs every morning.     cetirizine (ZYRTEC) 10 MG tablet Take 10 mg by mouth at bedtime.     montelukast  (SINGULAIR ) 10 MG tablet Take 1 tablet (10 mg total) by mouth daily. 90 tablet 3   Multiple Vitamins-Minerals (LUTEIN-ZEAXANTHIN) TABS Take 2 mg by mouth daily.     omeprazole  (PRILOSEC) 40 MG capsule Take 40 mg by mouth 2 (two) times daily.     tamsulosin  (FLOMAX ) 0.4 MG CAPS capsule Take 0.4 mg by mouth daily.     vitamin D, CHOLECALCIFEROL, 400 UNITS tablet Take 1,000 Units by mouth daily. (Patient taking differently: Taking 25 mcg by mouth daily- Vitamin D3)     clopidogrel  (PLAVIX ) 75 MG tablet Take 1 tablet (75 mg total) by mouth daily. (Patient not taking: Reported on 12/25/2023) 20 tablet 0   No current facility-administered medications  for this encounter.    Physical Exam: BP 118/70   Pulse (!) 55   Ht 5' 10 (1.778 m)   Wt 80.2 kg   BMI 25.37 kg/m   GEN: Well nourished, well developed in no acute distress NECK: No JVD CARDIAC: Regular rate and rhythm, no murmurs, rubs, gallops RESPIRATORY:  Clear to auscultation without rales, wheezing or rhonchi  ABDOMEN: Soft, non-tender, non-distended EXTREMITIES:  No edema; No deformity   Wt Readings from Last 3 Encounters:  12/25/23 80.2 kg  09/06/23 82.4 kg   08/02/23 81.6 kg     EKG today demonstrates  SB, PVC Vent. rate 55 BPM PR interval 166 ms QRS duration 88 ms QT/QTcB 394/376 ms   Echo 08/03/23 demonstrated   1. Left ventricular ejection fraction, by estimation, is 60 to 65%. The  left ventricle has normal function. The left ventricle has no regional  wall motion abnormalities. Left ventricular diastolic parameters are  consistent with Grade I diastolic dysfunction (impaired relaxation).   2. Right ventricular systolic function is normal. The right ventricular  size is normal.   3. The mitral valve is grossly normal. No evidence of mitral valve  regurgitation. No evidence of mitral stenosis.   4. The aortic valve is tricuspid. There is moderate calcification of the  aortic valve. Aortic valve regurgitation is not visualized. Aortic valve  sclerosis is present, with no evidence of aortic valve stenosis.   5. Aortic dilatation noted. There is mild dilatation of the ascending  aorta, measuring 40 mm.   6. The inferior vena cava is normal in size with greater than 50%  respiratory variability, suggesting right atrial pressure of 3 mmHg.   Comparison(s): No prior Echocardiogram.    CHA2DS2-VASc Score = 5  The patient's score is based upon: CHF History: 0 HTN History: 0 Diabetes History: 0 Stroke History: 2 Vascular Disease History: 1 (aortic atherosclerosis) Age Score: 2 Gender Score: 0       ASSESSMENT AND PLAN: Paroxysmal Atrial Fibrillation (ICD10:  I48.0) The patient's CHA2DS2-VASc score is 5, indicating a 7.2% annual risk of stroke.   General education about afib provided and questions answered. We also discussed his stroke risk and the risks and benefits of anticoagulation. Will start Eliquis  5 mg BID and stop ASA Continue to monitor afib burden on ILR  Secondary Hypercoagulable State (ICD10:  D68.69) The patient is at significant risk for stroke/thromboembolism based upon his CHA2DS2-VASc Score of 5.  Start  Apixaban  (Eliquis ).     Follow up in the AF clinic in one month.        [facility] [address] [place], [place] [address] [phone]

## 2023-12-25 NOTE — Patient Instructions (Signed)
 Stop aspirin  Start Eliquis 5mg  twice a day

## 2023-12-26 DIAGNOSIS — M542 Cervicalgia: Secondary | ICD-10-CM | POA: Diagnosis not present

## 2023-12-27 DIAGNOSIS — M542 Cervicalgia: Secondary | ICD-10-CM | POA: Diagnosis not present

## 2024-01-02 ENCOUNTER — Encounter: Payer: Self-pay | Admitting: Pulmonary Disease

## 2024-01-02 ENCOUNTER — Ambulatory Visit: Admitting: Pulmonary Disease

## 2024-01-02 VITALS — BP 100/60 | HR 53 | Ht 70.0 in | Wt 177.6 lb

## 2024-01-02 DIAGNOSIS — K219 Gastro-esophageal reflux disease without esophagitis: Secondary | ICD-10-CM

## 2024-01-02 DIAGNOSIS — R498 Other voice and resonance disorders: Secondary | ICD-10-CM

## 2024-01-02 DIAGNOSIS — J4540 Moderate persistent asthma, uncomplicated: Secondary | ICD-10-CM | POA: Diagnosis not present

## 2024-01-02 MED ORDER — MONTELUKAST SODIUM 10 MG PO TABS: 10.0000 mg | ORAL_TABLET | Freq: Every day | ORAL | 3 refills | Status: AC

## 2024-01-02 MED ORDER — PROAIR RESPICLICK 108 (90 BASE) MCG/ACT IN AEPB: 2.0000 | INHALATION_SPRAY | Freq: Four times a day (QID) | RESPIRATORY_TRACT | 3 refills | Status: AC | PRN

## 2024-01-02 NOTE — Patient Instructions (Signed)
 VISIT SUMMARY:  Today, we discussed your persistent hoarseness and difficulty swallowing liquids, as well as your asthma management, atrial fibrillation, and post-stroke symptoms. We reviewed your current medications and made some recommendations to help manage your symptoms more effectively.  YOUR PLAN:  -HOARSENESS AND THROAT CLEARING: Your chronic hoarseness and throat clearing may be related to your inhaler use or a condition called laryngeal pharyngeal reflux (LPR), which can cause inflammation of the vocal cords. We recommend using a spacer with your inhaler to reduce hoarseness and have referred you to an ENT specialist to evaluate your vocal cords. Please follow up with the ENT if you do not hear from them within a month.  -ASTHMA: Asthma is a condition where your airways become inflamed and narrow, making it hard to breathe. You should continue using your current inhaler (generic Symbicort ) with one puff twice a day. Make sure to use a spacer with your inhaler and rinse your mouth after each use to help reduce hoarseness.  -LARYNGEAL PHARYNGEAL REFLUX (LPR): LPR is a condition where stomach acid flows back into the throat, potentially causing hoarseness and vocal cord inflammation. Continue taking your reflux medication as prescribed. We have also referred you to an ENT specialist to evaluate your vocal cords.  -ATRIAL FIBRILLATION: Atrial fibrillation is an irregular and often rapid heart rate that can increase your risk of strokes and other heart-related complications. You are currently managing this with Eliquis . We noted a recent lower heart rate of 53 bpm, which is within the normal range but lower than expected. Please monitor your heart rate every other day and contact your cardiologist if it remains below 50 bpm or if you experience dizziness.  INSTRUCTIONS:  Please follow up with the ENT specialist if you do not hear from them within a month. Monitor your heart rate every other day  and contact your cardiologist if it remains below 50 bpm or if you experience dizziness.

## 2024-01-02 NOTE — Progress Notes (Signed)
 Erik Gomez    [id number]    July 01, 1940  Primary Care Physician:Aronson, Charlie, MD  Referring Physician: Shepard Charlie, MD [address] [place],  [place] [address]  Chief complaint: Follow-up for asthma, acute visit for swallowed dental crown  HPI: 83 y.o.  with history of asthma, GERD, vitamin D deficiency Maintained on Symbicort  Follows with Dr. Aneita for GERD, LPR.  Had a modified barium swallow on 09/15/2020 which did not show any evidence of aspiration  He had a dental crown aspirated, which was successfully removed with bronchoscopy in October 2024. He has no residual symptoms from this incident.   Interval history: Discussed the use of AI scribe software for clinical note transcription with the patient, who gave verbal consent to proceed.  History of Present Illness Erik Gomez Tonna is an 83 year old male with asthma and atrial fibrillation who presents with hoarseness and difficulty swallowing liquids.  Dysphonia and oropharyngeal dysphagia - Persistent hoarseness and sensation of needing to clear throat before speaking - Episodes of choking on liquids - Symptoms present for a prolonged duration - No heartburn or reflux symptoms reaching the throat - Takes reflux medication twice daily.  Asthma management and inhaler use - Asthma previously managed with Breo, transitioned to Symbicort , currently using generic Brain inhaler - Uses one puff twice daily - Does not use a spacer due to difficulty fitting it properly - Uncertain if dose should be increased - No improvement in hoarseness with current inhaler regimen  Atrial fibrillation and anticoagulation - History of atrial fibrillation, currently on Eliquis  - No other antiarrhythmic medications - Recent observation of lower heart rate, approximately 53 beats per minute  Post-stroke symptoms and orthostatic dizziness - History of stroke in February - Occasional dizziness when  standing, attributed to age    Relevant Pulmonary History Pets: No pets Occupation: Retired [employer] architect Exposures: No mold, hot tub, Jacuzzi or asbestos exposure.  He has down pillows on the couch but hardly sits on it Smoking history: 12-pack-year smoker.  Quit in 1970 Travel history: No significant travel history Relevant family history: No significant family history of lung disease  Outpatient Encounter Medications as of 01/02/2024  Medication Sig   apixaban  (ELIQUIS ) 5 MG TABS tablet Take 1 tablet (5 mg total) by mouth 2 (two) times daily.   atorvastatin  (LIPITOR) 40 MG tablet Take 1 tablet (40 mg total) by mouth daily.   Biotin 1 MG CAPS Take 1 capsule by mouth daily.   budesonide -formoterol  (BREYNA ) 160-4.5 MCG/ACT inhaler Inhale 1 puff into the lungs every morning.   cetirizine (ZYRTEC) 10 MG tablet Take 10 mg by mouth at bedtime.   Multiple Vitamins-Minerals (LUTEIN-ZEAXANTHIN) TABS Take 2 mg by mouth daily.   omeprazole  (PRILOSEC) 40 MG capsule Take 40 mg by mouth 2 (two) times daily.   tamsulosin  (FLOMAX ) 0.4 MG CAPS capsule Take 0.4 mg by mouth daily.   vitamin D, CHOLECALCIFEROL, 400 UNITS tablet Take 1,000 Units by mouth daily. (Patient taking differently: Taking 25 mcg by mouth daily- Vitamin D3)   Albuterol  Sulfate (PROAIR  RESPICLICK) 108 (90 Base) MCG/ACT AEPB Inhale 2 puffs into the lungs every 6 (six) hours as needed.   montelukast  (SINGULAIR ) 10 MG tablet Take 1 tablet (10 mg total) by mouth daily.   [DISCONTINUED] Albuterol  Sulfate (PROAIR  RESPICLICK) 108 (90 Base) MCG/ACT AEPB Inhale 2 puffs into the lungs every 6 (six) hours as needed.   [DISCONTINUED] montelukast  (SINGULAIR ) 10 MG tablet Take  1 tablet (10 mg total) by mouth daily.   No facility-administered encounter medications on file as of 01/02/2024.   Physical Exam: Gen:      No acute distress HEENT:  EOMI, sclera anicteric Neck:     No masses; no thyromegaly Lungs:    Clear to auscultation bilaterally;  normal respiratory effort CV:         Regular rate and rhythm; no murmurs Abd:      + bowel sounds; soft, non-tender; no palpable masses, no distension Ext:    No edema; adequate peripheral perfusion Neuro: alert and oriented x 3 Psych: normal mood and affect   Data Reviewed: Imaging: Chest x-ray 11/28/2016-mild continue still look forward he got labs recently at his right base subsegmental atelectasis.  Borderline cardiomegaly.  Chest x-ray 05/18/2020- no active cardiopulmonary disease   Modified barium swallow 09/15/2020-no aspiration  Chest x-ray 02/03/2023-no active cardiopulmonary disease, no radio opaque foreign body  CT chest 02/22/2023-curvilinear foreign body in the right mainstem bronchus  CT chest 08/06/2023-interval removal of dental crown from bronchus intermedius.  Mild emphysema.. I have reviewed the images personally.  PFTs: 06/13/2019 FVC 3.24 [77%], FEV1 2.06 [68%], F/F 63, TLC 7.50 [96%], DLCO 25.17 [101%] Moderate obstructive airway disease with air trapping and bronchodilator response.  ACT score  07/05/2020- 25 12/31/2021- 24  Labs: CBC 10/02/2020-WBC 4.6, eos 2.2%, absolute eosinophil count 101 IgE 10/02/2020-527  Assessment & Plan Hoarseness and throat clearing Chronic hoarseness and throat clearing, possibly related to inhaler use or laryngeal pharyngeal reflux. Differential diagnosis includes vocal cord inflammation due to LPR. - Instruct to use spacer with inhaler to reduce hoarseness - Refer to ENT for evaluation of vocal cords - Ensure follow-up with ENT if not contacted within a month  Asthma Asthma managed with inhaler therapy. Current inhaler is generic Symbicort  (Breyna ). Hoarseness may be related to inhaler use. - Continue current inhaler regimen: one puff twice a day - Educated on spacer use. Ensure use of spacer with inhaler - Rinse mouth after inhaler use  GERD, Laryngeal pharyngeal reflux (LPR) LPR managed with reflux medication. No current  symptoms of reflux reported. Possible contribution to hoarseness and vocal cord inflammation. - Continue current reflux medication regimen. Follows with [facility] - Refer to ENT for evaluation of vocal cords  Atrial fibrillation Atrial fibrillation managed with Eliquis . Recent low heart rate noted, but Eliquis  does not affect heart rate. Heart rate was 53 bpm, which is within normal range but lower than expected. - Monitor heart rate every other day - Advised to check with cardiologist if heart rate remains below 50 bpm or if symptoms of dizziness occur  Aspiration of dental crown No current symptoms following bronchoscopic removal of the crown in October 24. -Follow scan looks clear   Plan/Recommendations: Generic Symbicort  with spacer Continue Singulair  ENT referral Continue PPI  Brydon Spahr MD [facility] 01/02/2024, 3:46 PM  CC: Shepard Ade, MD

## 2024-01-08 NOTE — Progress Notes (Signed)
 [facility] Summary Report / Loop Recorder

## 2024-01-08 NOTE — Addendum Note (Signed)
 Addended by: VICCI SELLER A on: 01/08/2024 10:30 AM   Modules accepted: Orders

## 2024-01-18 ENCOUNTER — Ambulatory Visit (INDEPENDENT_AMBULATORY_CARE_PROVIDER_SITE_OTHER)

## 2024-01-18 DIAGNOSIS — I639 Cerebral infarction, unspecified: Secondary | ICD-10-CM

## 2024-01-18 LAB — CUP PACEART REMOTE DEVICE CHECK
Date Time Interrogation Session: 20250813234910
Implantable Pulse Generator Implant Date: 20250228

## 2024-01-20 ENCOUNTER — Ambulatory Visit: Payer: Self-pay | Admitting: Cardiology

## 2024-01-25 ENCOUNTER — Ambulatory Visit (HOSPITAL_COMMUNITY)
Admission: RE | Admit: 2024-01-25 | Discharge: 2024-01-25 | Disposition: A | Discharge status: Home / Self Care | Source: Ambulatory Visit | Attending: Physician Assistant | Admitting: Physician Assistant

## 2024-01-25 VITALS — BP 118/76 | HR 56 | Ht 70.0 in | Wt 177.8 lb

## 2024-01-25 DIAGNOSIS — D6869 Other thrombophilia: Secondary | ICD-10-CM

## 2024-01-25 DIAGNOSIS — I4891 Unspecified atrial fibrillation: Secondary | ICD-10-CM

## 2024-01-25 DIAGNOSIS — I480 Paroxysmal atrial fibrillation: Secondary | ICD-10-CM | POA: Diagnosis not present

## 2024-01-25 MED ORDER — RIVAROXABAN 20 MG PO TABS: 20.0000 mg | ORAL_TABLET | Freq: Every day | ORAL | 4 refills | Status: DC

## 2024-01-25 NOTE — Progress Notes (Signed)
 Primary Care Physician: Shepard Ade, MD Primary Cardiologist: None Electrophysiologist: Will Gladis Norton, MD  Referring Physician: Dr Norton Armond Erik Gomez is a 83 y.o. male with a history of asthma, former smoker, aortic atherosclerosis, atrial fibrillation who presents for follow up in the [facility].  The patient was hospitalized 07/2023 with an acute cryptogenic CVA. EP consulted for ILR insertion. The device clinic received an alert for an afib episode on 12/20/23 lasting 39 minutes. Patient was unaware of his arrhythmia.    Patient returns for follow up for atrial fibrillation. His ILR shows no interim episodes of afib. He does note that since starting Eliquis  he is more fatigued. He will have spells when he feels wiped out for 1-2 hours and then feels better. He denies blood in urine or stool.   Today, he  denies symptoms of palpitations, chest pain, shortness of breath, orthopnea, PND, lower extremity edema, dizziness, presyncope, syncope, snoring, daytime somnolence, bleeding, or neurologic sequela. The patient is tolerating medications without difficulties and is otherwise without complaint today.    Atrial Fibrillation Risk Factors:  he does not have symptoms or diagnosis of sleep apnea. he does not have a history of rheumatic fever. he does not have a history of alcohol  use. The patient does not have a history of early familial atrial fibrillation or other arrhythmias.  Atrial Fibrillation Management history:  Previous antiarrhythmic drugs: none Previous cardioversions: none Previous ablations: none Anticoagulation history: Eliquis   ROS- All systems are reviewed and negative except as per the HPI above.  Past Medical History:  Diagnosis Date   Aortic atherosclerosis (HCC) 02/22/2023   Recvd call (03/07/23) from [facility] with results - seen on 02/22/23 CT chest Xray   Asthma, intrinsic    Cataract    per pt beginning  of a cataract - bilateral   Chronic headache    GERD (gastroesophageal reflux disease)    w/ LPR   Hiatal hernia    Internal hemorrhoid    Osteoarthritis 02/05/2021   Peyronie disease    Pneumonia    x 1 as a child   Vitamin D deficiency     Current Outpatient Medications  Medication Sig Dispense Refill   Albuterol  Sulfate (PROAIR  RESPICLICK) 108 (90 Base) MCG/ACT AEPB Inhale 2 puffs into the lungs every 6 (six) hours as needed. 1 each 3   apixaban  (ELIQUIS ) 5 MG TABS tablet Take 1 tablet (5 mg total) by mouth 2 (two) times daily. 60 tablet 3   atorvastatin  (LIPITOR) 40 MG tablet Take 1 tablet (40 mg total) by mouth daily. 30 tablet 3   Biotin 1 MG CAPS Take 1 capsule by mouth daily.     budesonide -formoterol  (BREYNA ) 160-4.5 MCG/ACT inhaler Inhale 1 puff into the lungs every morning. (Patient taking differently: Inhale 1 puff into the lungs 2 (two) times daily.)     cetirizine (ZYRTEC) 10 MG tablet Take 10 mg by mouth at bedtime.     montelukast  (SINGULAIR ) 10 MG tablet Take 1 tablet (10 mg total) by mouth daily. 90 tablet 3   Multiple Vitamins-Minerals (LUTEIN-ZEAXANTHIN) TABS Take 2 mg by mouth daily.     omeprazole  (PRILOSEC) 40 MG capsule Take 40 mg by mouth 2 (two) times daily.     tamsulosin  (FLOMAX ) 0.4 MG CAPS capsule Take 0.4 mg by mouth daily.     vitamin D, CHOLECALCIFEROL, 400 UNITS tablet Take 1,000 Units by mouth daily.     No current facility-administered medications  for this encounter.    Physical Exam: BP 118/76   Pulse (!) 56   Ht 5' 10 (1.778 m)   Wt 80.6 kg   BMI 25.51 kg/m   GEN: Well nourished, well developed in no acute distress CARDIAC: Regular rate and rhythm, no murmurs, rubs, gallops RESPIRATORY:  Clear to auscultation without rales, wheezing or rhonchi  ABDOMEN: Soft, non-tender, non-distended EXTREMITIES:  No edema; No deformity    Wt Readings from Last 3 Encounters:  01/25/24 80.6 kg  01/02/24 80.6 kg  12/25/23 80.2 kg     EKG today  demonstrates  SB Vent. rate 56 BPM PR interval 154 ms QRS duration 96 ms QT/QTcB 390/376 ms   Echo 08/03/23 demonstrated   1. Left ventricular ejection fraction, by estimation, is 60 to 65%. The  left ventricle has normal function. The left ventricle has no regional  wall motion abnormalities. Left ventricular diastolic parameters are  consistent with Grade I diastolic dysfunction (impaired relaxation).   2. Right ventricular systolic function is normal. The right ventricular  size is normal.   3. The mitral valve is grossly normal. No evidence of mitral valve  regurgitation. No evidence of mitral stenosis.   4. The aortic valve is tricuspid. There is moderate calcification of the  aortic valve. Aortic valve regurgitation is not visualized. Aortic valve  sclerosis is present, with no evidence of aortic valve stenosis.   5. Aortic dilatation noted. There is mild dilatation of the ascending  aorta, measuring 40 mm.   6. The inferior vena cava is normal in size with greater than 50%  respiratory variability, suggesting right atrial pressure of 3 mmHg.   Comparison(s): No prior Echocardiogram.    CHA2DS2-VASc Score = 5  The patient's score is based upon: CHF History: 0 HTN History: 0 Diabetes History: 0 Stroke History: 2 Vascular Disease History: 1 (aortic atherosclerosis) Age Score: 2 Gender Score: 0       ASSESSMENT AND PLAN: Paroxysmal Atrial Fibrillation (ICD10:  I48.0) The patient's CHA2DS2-VASc score is 5, indicating a 7.2% annual risk of stroke.   ILR shows no further episodes of afib.  Patient complains of intermittent fatigue since starting Eliquis . Will switch to Xarelto  20 mg daily to see if symptoms improve.   Secondary Hypercoagulable State (ICD10:  D68.69) The patient is at significant risk for stroke/thromboembolism based upon his CHA2DS2-VASc Score of 5.  Start Rivaroxaban  (Xarelto ). Check bmet/cbc today.     Follow up in the AF clinic in 3 months.        [facility] [address] [place],  [address] [phone]

## 2024-01-25 NOTE — Patient Instructions (Addendum)
 Stop Eliquis  Start Xarelto  20 mg - Take 1 tablet by mouth daily- with your biggest meal of the day

## 2024-01-26 ENCOUNTER — Ambulatory Visit (HOSPITAL_COMMUNITY): Payer: Self-pay | Admitting: Physician Assistant

## 2024-01-26 LAB — CBC
Hematocrit: 39.3 % (ref 37.5–51.0)
Hemoglobin: 12.7 g/dL — ABNORMAL LOW (ref 13.0–17.7)
MCH: 31.4 pg (ref 26.6–33.0)
MCHC: 32.3 g/dL (ref 31.5–35.7)
MCV: 97 fL (ref 79–97)
Platelets: 166 x10E3/uL (ref 150–450)
RBC: 4.04 x10E6/uL — ABNORMAL LOW (ref 4.14–5.80)
RDW: 12.5 % (ref 11.6–15.4)
WBC: 5 x10E3/uL (ref 3.4–10.8)

## 2024-01-26 LAB — BASIC METABOLIC PANEL WITH GFR
BUN/Creatinine Ratio: 17 (ref 10–24)
BUN: 18 mg/dL (ref 8–27)
CO2: 23 mmol/L (ref 20–29)
Calcium: 9.1 mg/dL (ref 8.6–10.2)
Chloride: 103 mmol/L (ref 96–106)
Creatinine, Ser: 1.03 mg/dL (ref 0.76–1.27)
Glucose: 82 mg/dL (ref 70–99)
Potassium: 4.6 mmol/L (ref 3.5–5.2)
Sodium: 139 mmol/L (ref 134–144)
eGFR: 73 mL/min/1.73 (ref >59)

## 2024-01-29 ENCOUNTER — Encounter

## 2024-02-19 ENCOUNTER — Ambulatory Visit (INDEPENDENT_AMBULATORY_CARE_PROVIDER_SITE_OTHER)

## 2024-02-19 DIAGNOSIS — L905 Scar conditions and fibrosis of skin: Secondary | ICD-10-CM | POA: Diagnosis not present

## 2024-02-19 DIAGNOSIS — L821 Other seborrheic keratosis: Secondary | ICD-10-CM | POA: Diagnosis not present

## 2024-02-19 DIAGNOSIS — I639 Cerebral infarction, unspecified: Secondary | ICD-10-CM

## 2024-02-19 DIAGNOSIS — B356 Tinea cruris: Secondary | ICD-10-CM | POA: Diagnosis not present

## 2024-02-19 DIAGNOSIS — L570 Actinic keratosis: Secondary | ICD-10-CM | POA: Diagnosis not present

## 2024-02-19 DIAGNOSIS — D225 Melanocytic nevi of trunk: Secondary | ICD-10-CM | POA: Diagnosis not present

## 2024-02-19 DIAGNOSIS — Z85820 Personal history of malignant melanoma of skin: Secondary | ICD-10-CM | POA: Diagnosis not present

## 2024-02-19 DIAGNOSIS — Z85828 Personal history of other malignant neoplasm of skin: Secondary | ICD-10-CM | POA: Diagnosis not present

## 2024-02-19 DIAGNOSIS — D692 Other nonthrombocytopenic purpura: Secondary | ICD-10-CM | POA: Diagnosis not present

## 2024-02-20 LAB — CUP PACEART REMOTE DEVICE CHECK
Date Time Interrogation Session: 20250915000108
Implantable Pulse Generator Implant Date: 20250228

## 2024-02-26 ENCOUNTER — Ambulatory Visit: Payer: Self-pay | Admitting: Cardiology

## 2024-02-26 NOTE — Progress Notes (Signed)
 Remote Loop Recorder Transmission

## 2024-02-29 NOTE — Progress Notes (Signed)
 Remote Loop Recorder Transmission

## 2024-03-04 ENCOUNTER — Ambulatory Visit (INDEPENDENT_AMBULATORY_CARE_PROVIDER_SITE_OTHER): Admitting: Otolaryngology

## 2024-03-04 ENCOUNTER — Encounter (INDEPENDENT_AMBULATORY_CARE_PROVIDER_SITE_OTHER): Payer: Self-pay | Admitting: Otolaryngology

## 2024-03-04 ENCOUNTER — Encounter

## 2024-03-04 VITALS — BP 126/64 | HR 52 | Temp 98.0°F

## 2024-03-04 DIAGNOSIS — J383 Other diseases of vocal cords: Secondary | ICD-10-CM

## 2024-03-04 DIAGNOSIS — R490 Dysphonia: Secondary | ICD-10-CM

## 2024-03-04 DIAGNOSIS — K219 Gastro-esophageal reflux disease without esophagitis: Secondary | ICD-10-CM | POA: Diagnosis not present

## 2024-03-04 DIAGNOSIS — Z87891 Personal history of nicotine dependence: Secondary | ICD-10-CM | POA: Diagnosis not present

## 2024-03-04 NOTE — Progress Notes (Signed)
 ENT CONSULT:  Reason for Consult: chronic dysphonia    HPI: Discussed the use of AI scribe software for clinical note transcription with the patient, who gave verbal consent to proceed.  History of Present Illness Erik Gomez is an 83 year old male who presents with chronic voice changes and swallowing difficulties.   He has experienced voice changes since his teenage years, characterized by a 'scratchy or scrappy' quality. His voice sometimes allows him to speak clearly, while at other times it does not. He reports that he has not noticed any recent worsening of his voice.  He occasionally experiences coughing and choking when drinking liquids, describing it as getting 'strangled a little bit once in a while,' but it clears out. He reports that his swallowing is 'okay' and has been like this for a long time.  In February, he had a stroke, after which he aspirated a crown that required removal under anesthesia. He describes this experience as very uncomfortable.  He recently got hearing aids in April and is still getting used to them, but finds them very helpful.  He is currently on a blood thinner.   Records Reviewed:  Dr Theophilus Office visit 01/02/24 83 y.o.  with history of asthma, GERD, vitamin D deficiency Maintained on Symbicort  Follows with Dr. Aneita for GERD, LPR.  Had a modified barium swallow on 09/15/2020 which did not show any evidence of aspiration   He had a dental crown aspirated, which was successfully removed with bronchoscopy in October 2024. He has no residual symptoms from this incident.   Dysphonia and oropharyngeal dysphagia - Persistent hoarseness and sensation of needing to clear throat before speaking - Episodes of choking on liquids - Symptoms present for a prolonged duration - No heartburn or reflux symptoms reaching the throat - Takes reflux medication twice daily.      Past Medical History:  Diagnosis Date   Aortic atherosclerosis  02/22/2023   Recvd call (03/07/23) from [facility]iology with results - seen on 02/22/23 CT chest Xray   Asthma, intrinsic    Cataract    per pt beginning of a cataract - bilateral   Chronic headache    GERD (gastroesophageal reflux disease)    w/ LPR   Hiatal hernia    Internal hemorrhoid    Osteoarthritis 02/05/2021   Peyronie disease    Pneumonia    x 1 as a child   Vitamin D deficiency     Past Surgical History:  Procedure Laterality Date   CATARACT EXTRACTION, BILATERAL Bilateral 2018   COLONOSCOPY     FINGER SURGERY     middle finger of right hand   FOREIGN BODY REMOVAL  03/09/2023   Procedure: FOREIGN BODY REMOVAL;  Surgeon: Mannam, Praveen, MD;  Location: [facility] ENDOSCOPY;  Service: Cardiopulmonary;;  crown   KNEE SURGERY     Left   LOOP RECORDER INSERTION N/A 08/04/2023   Procedure: LOOP RECORDER INSERTION;  Surgeon: Inocencio Soyla Lunger, MD;  Location: [facility] INVASIVE CV LAB;  Service: Cardiovascular;  Laterality: N/A;   Peyronie's Disease surgery  2001   SHOULDER SURGERY Right    at [facility] [place]   SKIN CANCER EXCISION     Right leg   VIDEO BRONCHOSCOPY N/A 03/09/2023   Procedure: VIDEO BRONCHOSCOPY WITH FLUORO;  Surgeon: Mannam, Praveen, MD;  Location: [facility] ENDOSCOPY;  Service: Cardiopulmonary;  Laterality: N/A;    Family History  Problem Relation Age of Onset   Heart disease Mother  Heart disease Father    Diabetes Father    Diabetes Sister    Diabetes Brother    Colon cancer Neg Hx    Esophageal cancer Neg Hx    Stomach cancer Neg Hx    Rectal cancer Neg Hx     Social History:  reports that he quit smoking about 58 years ago. His smoking use included cigarettes. He started smoking about 64 years ago. He has a 12 pack-year smoking history. He has never used smokeless tobacco. He reports current alcohol  use of about 1.0 - 2.0 standard drink of alcohol  per week. He reports that he does not use drugs.  Allergies:  Allergies  Allergen Reactions    Codeine     Upset stomach   Gabapentin Other (See Comments)   Meloxicam     Upset stomach   Pantoprazole Sodium     Upset stomach   Sulfa Antibiotics Nausea Only    Medications: I have reviewed the patient's current medications.  The PMH, PSH, Medications, Allergies, and SH were reviewed and updated.  ROS: Constitutional: Negative for fever, weight loss and weight gain. Cardiovascular: Negative for chest pain and dyspnea on exertion. Respiratory: Is not experiencing shortness of breath at rest. Gastrointestinal: Negative for nausea and vomiting. Neurological: Negative for headaches. Psychiatric: The patient is not nervous/anxious  Blood pressure 126/64, pulse (!) 52, temperature 98 F (36.7 C), SpO2 94%. There is no height or weight on file to calculate BMI.  PHYSICAL EXAM:  Exam: General: Well-developed, well-nourished Communication and Voice: Clear pitch and clarity Respiratory Respiratory effort: Equal inspiration and expiration without stridor Cardiovascular Peripheral Vascular: Warm extremities with equal color/perfusion Eyes: No nystagmus with equal extraocular motion bilaterally Neuro/Psych/Balance: Patient oriented to person, place, and time; Appropriate mood and affect; Gait is intact with no imbalance; Cranial nerves I-XII are intact Head and Face Inspection: Normocephalic and atraumatic without mass or lesion Palpation: Facial skeleton intact without bony stepoffs Salivary Glands: No mass or tenderness Facial Strength: Facial motility symmetric and full bilaterally ENT Pinna: External ear intact and fully developed External canal: Canal is patent with intact skin Tympanic Membrane: Clear and mobile External Nose: No scar or anatomic deformity Internal Nose: Septum is deviated to the left. No polyp, or purulence. Mucosal edema and erythema present.  Bilateral inferior turbinate hypertrophy.  Lips, Teeth, and gums: Mucosa and teeth intact and viable TMJ: No  pain to palpation with full mobility Oral cavity/oropharynx: No erythema or exudate, no lesions present Nasopharynx: No mass or lesion with intact mucosa Hypopharynx: Intact mucosa without pooling of secretions Larynx Glottic: Full true vocal cord mobility without lesion or mass Supraglottic: Normal appearing epiglottis and AE folds Interarytenoid Space: Moderate pachydermia&edema Subglottic Space: Patent without lesion or edema Neck Neck and Trachea: Midline trachea without mass or lesion Thyroid : No mass or nodularity Lymphatics: No lymphadenopathy  Procedure:  Preoperative diagnosis: hoarseness  Postoperative diagnosis:   same  Procedure: Flexible fiberoptic laryngoscopy with stroboscopy (68420)   Surgeon: Adie Vilar, MD  Anesthesia: Topical lidocaine  and Afrin  Complications: None  Condition is stable throughout exam  Indications and consent:   The patient presents to the clinic with hoarseness. All the risks, benefits, and potential complications were reviewed with the patient preoperatively and informed verbal consent was obtained.  Procedure: The patient was seated upright in the exam chair.   Topical lidocaine  and Afrin were applied to the nasal cavity. After adequate anesthesia had occurred, the flexible telescope with strobe capabilities was passed into the  nasal cavity. The nasopharynx was patent without mass or lesion. The scope was passed behind the soft palate and directed toward the base of tongue. The base of tongue was visualized and was symmetric with no apparent masses or abnormal appearing tissue. There were no signs of a mass or pooling of secretions in the piriform sinuses. The supraglottic structures were normal.  The true vocal cords are mobile. The medial edges were slightly bowed. Closure was near complete with severe supraglottic compression. Periodicity present. The mucosal wave and amplitude were intact. There is moderate interarytenoid pachydermia  and post cricoid edema.  The laryngoscope was then slowly withdrawn and the patient tolerated the procedure well. There were no complications or blood loss.    Studies Reviewed: CT chest 07/20/23 1. Interval removal of dental crown within bronchus intermedius. No adverse sequela. Tracheobronchial tree is clear. 2. Coronary artery calcifications. Aortic Atherosclerosis (ICD10-I70.0). 3. Minimal emphysema.  Emphysema  MR brian 08/02/23 1. Acute right MCA distribution infarct, primarily involving the posterior right temporal lobe as above. Minimal associated petechial blood products without hemorrhagic transformation or significant mass effect. 2. Additional punctate acute ischemic nonhemorrhagic infarct at the left occipital lobe. 3. Underlying mild chronic microvascular ischemic disease with a few small remote cerebellar infarcts. 4. Small soft tissue contusion at the left frontal scalp. 5. Subcentimeter focus of enhancement at the right occipital lobe. Finding is nonspecific, but favored to be either vascular in nature or possibly related to underlying ischemic changes. Short interval follow-up study in 3 months time is suggested to evaluate for resolution and/or stability.  MBS 09/16/23 Patient presents with a normal oropharyngeal swallow ability for his age without aspiration or frank penetration of any consistency tested.  Swallow was timely; trace flash penetration of thin liquid observed.  Observed patient with small boluses and sequential swallows of thin * x10.  Mild vallecular retention observed with puree/cracker more than with thin liquid *approximately 20% of cracker bolus*.  Pt sensed retention evidenced by pharynx being clear upon return to flouroscopy *he conducted independent dry swallow.  SLP recommends continue regular/thin diet with general aspiration precautions.  Thanks for this referral.   SLP Visit Diagnosis Dysphagia, pharyngeal phase (R13.13)   Esophagram   IMPRESSION: 1. Poor initiation primary stripping wave and to and fro motion of barium bolus in the high cervical esophagus. Mild-to-moderate esophageal dysmotility favored presbyesophagus. 2. No mucosal irregularity stricture or mass identified in the esophagus. 3. Barium tablet passed GE junction easily. Assessment/Plan: Encounter Diagnoses  Name Primary?   Muscle tension dysphonia Yes   Dysphonia    Hoarseness    Age-related vocal fold atrophy    Glottic insufficiency     Assessment and Plan Assessment & Plan Chronic Dysphonia Chronic dysphonia with no vocal cord lesions or paralysis, likely due to compensatory muscle tension from past laryngitis since he reports having this voice when he was in his teen years, and evidence of significant supraglottic compression when phonation attempted during strobe exam. He declined voice therapy as symptoms are not bothersome. He also had findings c/w GERD LPR - Offer referral to voice therapy if desired in the future. - management of GERD  GERD LPR -  Reflux Gourmet after meals - diet and lifestyle changes to minimize GERD - Refer to [employer] blog for dietary and lifestyle modifications/reflux cook book   Thank you for allowing me to participate in the care of this patient. Please do not hesitate to contact me with any questions or concerns.  Elena Larry, MD Otolaryngology [facility] Phone: [phone] Fax: [phone]    03/04/2024, 3:42 PM

## 2024-03-09 DIAGNOSIS — Z23 Encounter for immunization: Secondary | ICD-10-CM | POA: Diagnosis not present

## 2024-03-13 NOTE — Progress Notes (Signed)
 Remote Loop Recorder Transmission

## 2024-03-19 DIAGNOSIS — H15101 Unspecified episcleritis, right eye: Secondary | ICD-10-CM | POA: Diagnosis not present

## 2024-03-21 ENCOUNTER — Ambulatory Visit

## 2024-03-21 ENCOUNTER — Encounter

## 2024-03-21 DIAGNOSIS — I639 Cerebral infarction, unspecified: Secondary | ICD-10-CM

## 2024-03-21 LAB — CUP PACEART REMOTE DEVICE CHECK
Date Time Interrogation Session: 20251015234611
Implantable Pulse Generator Implant Date: 20250228

## 2024-03-27 NOTE — Progress Notes (Signed)
 Remote Loop Recorder Transmission

## 2024-04-02 ENCOUNTER — Ambulatory Visit: Payer: Self-pay | Admitting: Cardiology

## 2024-04-03 DIAGNOSIS — H15101 Unspecified episcleritis, right eye: Secondary | ICD-10-CM | POA: Diagnosis not present

## 2024-04-08 ENCOUNTER — Encounter

## 2024-04-21 ENCOUNTER — Encounter

## 2024-04-22 ENCOUNTER — Encounter

## 2024-04-23 ENCOUNTER — Ambulatory Visit: Attending: Cardiology

## 2024-04-23 DIAGNOSIS — I4891 Unspecified atrial fibrillation: Secondary | ICD-10-CM

## 2024-04-23 DIAGNOSIS — H5340 Unspecified visual field defects: Secondary | ICD-10-CM | POA: Diagnosis not present

## 2024-04-23 DIAGNOSIS — H43813 Vitreous degeneration, bilateral: Secondary | ICD-10-CM | POA: Diagnosis not present

## 2024-04-23 DIAGNOSIS — H04123 Dry eye syndrome of bilateral lacrimal glands: Secondary | ICD-10-CM | POA: Diagnosis not present

## 2024-04-23 DIAGNOSIS — H52203 Unspecified astigmatism, bilateral: Secondary | ICD-10-CM | POA: Diagnosis not present

## 2024-04-23 DIAGNOSIS — H0100B Unspecified blepharitis left eye, upper and lower eyelids: Secondary | ICD-10-CM | POA: Diagnosis not present

## 2024-04-23 DIAGNOSIS — H524 Presbyopia: Secondary | ICD-10-CM | POA: Diagnosis not present

## 2024-04-23 DIAGNOSIS — H0100A Unspecified blepharitis right eye, upper and lower eyelids: Secondary | ICD-10-CM | POA: Diagnosis not present

## 2024-04-23 DIAGNOSIS — H353132 Nonexudative age-related macular degeneration, bilateral, intermediate dry stage: Secondary | ICD-10-CM | POA: Diagnosis not present

## 2024-04-24 ENCOUNTER — Ambulatory Visit: Payer: Self-pay | Admitting: Cardiology

## 2024-04-24 LAB — CUP PACEART REMOTE DEVICE CHECK
Date Time Interrogation Session: 20251117235414
Implantable Pulse Generator Implant Date: 20250228

## 2024-04-26 NOTE — Progress Notes (Signed)
 Remote Loop Recorder Transmission

## 2024-04-29 NOTE — Telephone Encounter (Signed)
 [facility] 10/19/23 note received via fax on 04/25/24. Note placed in [place] NP's office for review.

## 2024-04-30 ENCOUNTER — Ambulatory Visit (HOSPITAL_COMMUNITY)
Admission: RE | Admit: 2024-04-30 | Discharge: 2024-04-30 | Disposition: A | Discharge status: Home / Self Care | Source: Ambulatory Visit | Attending: Physician Assistant | Admitting: Physician Assistant

## 2024-04-30 VITALS — BP 108/72 | HR 61 | Ht 70.0 in | Wt 177.0 lb

## 2024-04-30 DIAGNOSIS — D6869 Other thrombophilia: Secondary | ICD-10-CM | POA: Diagnosis not present

## 2024-04-30 DIAGNOSIS — I4891 Unspecified atrial fibrillation: Secondary | ICD-10-CM | POA: Diagnosis not present

## 2024-04-30 DIAGNOSIS — I480 Paroxysmal atrial fibrillation: Secondary | ICD-10-CM

## 2024-04-30 MED ORDER — RIVAROXABAN 20 MG PO TABS: 20.0000 mg | ORAL_TABLET | Freq: Every day | ORAL | 2 refills | Status: AC

## 2024-04-30 NOTE — Progress Notes (Signed)
 Primary Care Physician: Shepard Ade, MD Primary Cardiologist: None Electrophysiologist: Will Gladis Norton, MD  Referring Physician: Dr Norton Armond Erik Gomez is a 83 y.o. male with a history of asthma, former smoker, aortic atherosclerosis, atrial fibrillation who presents for follow up in the [facility].  The patient was hospitalized 07/2023 with an acute cryptogenic CVA. EP consulted for ILR insertion. The device clinic received an alert for an afib episode on 12/20/23 lasting 39 minutes. Patient was unaware of his arrhythmia.    Patient returns for follow up for atrial fibrillation. He remains in SR today and feels well. ILR shows 0% afib burden. No bleeding issues with Xarelto .   Today, he  denies symptoms of palpitations, chest pain, shortness of breath, orthopnea, PND, lower extremity edema, dizziness, presyncope, syncope, snoring, daytime somnolence, bleeding, or neurologic sequela. The patient is tolerating medications without difficulties and is otherwise without complaint today.    Atrial Fibrillation Risk Factors:  he does not have symptoms or diagnosis of sleep apnea. he does not have a history of rheumatic fever. he does not have a history of alcohol  use. The patient does not have a history of early familial atrial fibrillation or other arrhythmias.  Atrial Fibrillation Management history:  Previous antiarrhythmic drugs: none Previous cardioversions: none Previous ablations: none Anticoagulation history: Eliquis  (did not tolerate), Xarelto    ROS- All systems are reviewed and negative except as per the HPI above.  Past Medical History:  Diagnosis Date   Aortic atherosclerosis 02/22/2023   Recvd call (03/07/23) from [facility] Radiology with results - seen on 02/22/23 CT chest Xray   Asthma, intrinsic    Cataract    per pt beginning of a cataract - bilateral   Chronic headache    GERD (gastroesophageal reflux disease)    w/ LPR    Hiatal hernia    Internal hemorrhoid    Osteoarthritis 02/05/2021   Peyronie disease    Pneumonia    x 1 as a child   Vitamin D deficiency     Current Outpatient Medications  Medication Sig Dispense Refill   Albuterol  Sulfate (PROAIR  RESPICLICK) 108 (90 Base) MCG/ACT AEPB Inhale 2 puffs into the lungs every 6 (six) hours as needed. 1 each 3   atorvastatin  (LIPITOR) 40 MG tablet Take 1 tablet (40 mg total) by mouth daily. 30 tablet 3   Biotin 1 MG CAPS Take 1 capsule by mouth daily.     budesonide -formoterol  (BREYNA ) 160-4.5 MCG/ACT inhaler Inhale 1 puff into the lungs every morning. (Patient taking differently: Inhale 1 puff into the lungs 2 (two) times daily.)     cetirizine (ZYRTEC) 10 MG tablet Take 10 mg by mouth at bedtime.     montelukast  (SINGULAIR ) 10 MG tablet Take 1 tablet (10 mg total) by mouth daily. 90 tablet 3   Multiple Vitamins-Minerals (ICAPS AREDS 2 PO) Take 2 tablets by mouth every morning.     omeprazole  (PRILOSEC) 40 MG capsule Take 40 mg by mouth 2 (two) times daily.     tamsulosin  (FLOMAX ) 0.4 MG CAPS capsule Take 0.4 mg by mouth daily.     vitamin D, CHOLECALCIFEROL, 400 UNITS tablet Take 1,000 Units by mouth daily.     rivaroxaban  (XARELTO ) 20 MG TABS tablet Take 1 tablet (20 mg total) by mouth daily with supper. 90 tablet 2   No current facility-administered medications for this encounter.    Physical Exam: BP 108/72   Pulse 61   Ht 5'  10 (1.778 m)   Wt 80.3 kg   BMI 25.40 kg/m   GEN: Well nourished, well developed in no acute distress CARDIAC: Regular rate and rhythm, no murmurs, rubs, gallops RESPIRATORY:  Clear to auscultation without rales, wheezing or rhonchi  ABDOMEN: Soft, non-tender, non-distended EXTREMITIES:  No edema; No deformity    Wt Readings from Last 3 Encounters:  04/30/24 80.3 kg  01/25/24 80.6 kg  01/02/24 80.6 kg     EKG Interpretation Date/Time:  Tuesday April 30 2024 10:38:58 EST Ventricular Rate:  61 PR  Interval:  160 QRS Duration:  92 QT Interval:  396 QTC Calculation: 398 R Axis:   57  Text Interpretation: Normal sinus rhythm Normal ECG When compared with ECG of 25-Jan-2024 11:37, No significant change was found Confirmed by Julion Gatt ([id number]) on 04/30/2024 10:44:06 AM    Echo 08/03/23 demonstrated   1. Left ventricular ejection fraction, by estimation, is 60 to 65%. The  left ventricle has normal function. The left ventricle has no regional  wall motion abnormalities. Left ventricular diastolic parameters are  consistent with Grade I diastolic dysfunction (impaired relaxation).   2. Right ventricular systolic function is normal. The right ventricular  size is normal.   3. The mitral valve is grossly normal. No evidence of mitral valve  regurgitation. No evidence of mitral stenosis.   4. The aortic valve is tricuspid. There is moderate calcification of the  aortic valve. Aortic valve regurgitation is not visualized. Aortic valve  sclerosis is present, with no evidence of aortic valve stenosis.   5. Aortic dilatation noted. There is mild dilatation of the ascending  aorta, measuring 40 mm.   6. The inferior vena cava is normal in size with greater than 50%  respiratory variability, suggesting right atrial pressure of 3 mmHg.   Comparison(s): No prior Echocardiogram.    CHA2DS2-VASc Score = 5  The patient's score is based upon: CHF History: 0 HTN History: 0 Diabetes History: 0 Stroke History: 2 Vascular Disease History: 1 (aortic atherosclerosis) Age Score: 2 Gender Score: 0       ASSESSMENT AND PLAN: Paroxysmal Atrial Fibrillation (ICD10:  I48.0) The patient's CHA2DS2-VASc score is 5, indicating a 7.2% annual risk of stroke.   Patient in SR today. ILR shows 0% afib burden. Continue Xarelto  20 mg daily  Secondary Hypercoagulable State (ICD10:  D68.69) The patient is at significant risk for stroke/thromboembolism based upon his CHA2DS2-VASc Score of 5.  Continue  Rivaroxaban  (Xarelto ). No bleeding issues.     Follow up in the AF clinic in 6 months.    [facility] [address] [place], [place] [address] [phone]

## 2024-05-06 NOTE — Telephone Encounter (Signed)
 Called pt. Relayed message from [place]. Pt specific question:   Wanting to know if Harlene feels it is safe for him to drive. If there is improvement on his vision screening.?  Aware I will send to her and we will call back with her response.

## 2024-05-06 NOTE — Telephone Encounter (Signed)
 Called pt at [phone].

## 2024-05-06 NOTE — Telephone Encounter (Signed)
 Pt called to follow up on How MD feels about  [facility] Opthalmology report. Pt would like to speak to NP about results and see what's is her recommendation .

## 2024-05-06 NOTE — Telephone Encounter (Signed)
 Report shows continued left lower vision loss bilaterally (left inferior quadrantanopia) post stroke.  This was noted on prior ophthalmology exam from 10/2023.  Will send report to medical records to be scanned into chart. Please contact patient to see what recommendations he is looking for? Thank you.

## 2024-05-07 NOTE — Telephone Encounter (Signed)
 Called pt, relayed message from [place]. Pt verbalized understanding and will contact Ophthalmologist.

## 2024-05-07 NOTE — Telephone Encounter (Signed)
 Please advise clearance for driving needs to be obtained by ophthalmology due to vision impairment as previously discussed. Thank you.

## 2024-05-22 ENCOUNTER — Encounter

## 2024-05-24 ENCOUNTER — Ambulatory Visit

## 2024-05-24 DIAGNOSIS — I4891 Unspecified atrial fibrillation: Secondary | ICD-10-CM | POA: Diagnosis not present

## 2024-05-24 LAB — CUP PACEART REMOTE DEVICE CHECK
Date Time Interrogation Session: 20251218233207
Implantable Pulse Generator Implant Date: 20250228

## 2024-05-27 NOTE — Progress Notes (Signed)
 Remote Loop Recorder Transmission

## 2024-05-31 ENCOUNTER — Ambulatory Visit: Payer: Self-pay | Admitting: Cardiology

## 2024-06-22 ENCOUNTER — Encounter

## 2024-06-24 ENCOUNTER — Ambulatory Visit: Attending: Cardiology

## 2024-06-24 DIAGNOSIS — I4891 Unspecified atrial fibrillation: Secondary | ICD-10-CM | POA: Diagnosis not present

## 2024-06-25 ENCOUNTER — Ambulatory Visit: Payer: Self-pay | Admitting: Cardiology

## 2024-06-25 LAB — CUP PACEART REMOTE DEVICE CHECK
Date Time Interrogation Session: 20260118234856
Implantable Pulse Generator Implant Date: 20250228

## 2024-06-28 NOTE — Progress Notes (Signed)
 Remote Loop Recorder Transmission

## 2024-07-01 ENCOUNTER — Ambulatory Visit: Admitting: Pulmonary Disease

## 2024-07-23 ENCOUNTER — Encounter

## 2024-07-25 ENCOUNTER — Ambulatory Visit

## 2024-07-25 ENCOUNTER — Ambulatory Visit: Admitting: Pulmonary Disease

## 2024-08-23 ENCOUNTER — Encounter

## 2024-08-25 ENCOUNTER — Ambulatory Visit

## 2024-09-25 ENCOUNTER — Ambulatory Visit

## 2024-10-26 ENCOUNTER — Ambulatory Visit

## 2024-10-29 ENCOUNTER — Ambulatory Visit (HOSPITAL_COMMUNITY): Admitting: Physician Assistant

## 2024-11-26 ENCOUNTER — Ambulatory Visit

## 2024-12-27 ENCOUNTER — Ambulatory Visit

## 2025-01-27 ENCOUNTER — Ambulatory Visit

## 2025-02-27 ENCOUNTER — Ambulatory Visit

## 2025-03-30 ENCOUNTER — Ambulatory Visit
